# Patient Record
Sex: Male | Born: 1995 | Race: White | Hispanic: No | Marital: Single | Smoking: Former smoker
Health system: Southern US, Community
[De-identification: ages and names within clinical notes are randomized; demographics above are authoritative.]

## PROBLEM LIST (undated history)

## (undated) DIAGNOSIS — J45909 Unspecified asthma, uncomplicated: Secondary | ICD-10-CM

## (undated) DIAGNOSIS — R569 Unspecified convulsions: Secondary | ICD-10-CM

## (undated) DIAGNOSIS — F329 Major depressive disorder, single episode, unspecified: Secondary | ICD-10-CM

## (undated) DIAGNOSIS — F319 Bipolar disorder, unspecified: Secondary | ICD-10-CM

## (undated) DIAGNOSIS — F4310 Post-traumatic stress disorder, unspecified: Secondary | ICD-10-CM

## (undated) DIAGNOSIS — F419 Anxiety disorder, unspecified: Secondary | ICD-10-CM

## (undated) DIAGNOSIS — F32A Depression, unspecified: Secondary | ICD-10-CM

## (undated) HISTORY — DX: Major depressive disorder, single episode, unspecified: F32.9

## (undated) HISTORY — DX: Depression, unspecified: F32.A

## (undated) HISTORY — PX: HERNIA REPAIR: SHX51

## (undated) HISTORY — DX: Anxiety disorder, unspecified: F41.9

---
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
## 2016-09-05 ENCOUNTER — Emergency Department: Payer: Medicaid Other

## 2016-09-05 ENCOUNTER — Encounter: Payer: Self-pay | Admitting: *Deleted

## 2016-09-05 ENCOUNTER — Inpatient Hospital Stay
Admission: EM | Admit: 2016-09-05 | Discharge: 2016-09-08 | DRG: 897 | Disposition: A | Discharge status: Home / Self Care | Payer: Medicaid Other | Attending: Internal Medicine | Admitting: Internal Medicine

## 2016-09-05 DIAGNOSIS — W2201XA Walked into wall, initial encounter: Secondary | ICD-10-CM | POA: Diagnosis present

## 2016-09-05 DIAGNOSIS — F909 Attention-deficit hyperactivity disorder, unspecified type: Secondary | ICD-10-CM | POA: Diagnosis present

## 2016-09-05 DIAGNOSIS — F17200 Nicotine dependence, unspecified, uncomplicated: Secondary | ICD-10-CM | POA: Diagnosis present

## 2016-09-05 DIAGNOSIS — F19959 Other psychoactive substance use, unspecified with psychoactive substance-induced psychotic disorder, unspecified: Principal | ICD-10-CM | POA: Diagnosis present

## 2016-09-05 DIAGNOSIS — F3181 Bipolar II disorder: Secondary | ICD-10-CM | POA: Diagnosis present

## 2016-09-05 DIAGNOSIS — Z810 Family history of intellectual disabilities: Secondary | ICD-10-CM

## 2016-09-05 DIAGNOSIS — Z79899 Other long term (current) drug therapy: Secondary | ICD-10-CM

## 2016-09-05 DIAGNOSIS — R410 Disorientation, unspecified: Secondary | ICD-10-CM | POA: Diagnosis not present

## 2016-09-05 DIAGNOSIS — F29 Unspecified psychosis not due to a substance or known physiological condition: Secondary | ICD-10-CM

## 2016-09-05 DIAGNOSIS — R569 Unspecified convulsions: Secondary | ICD-10-CM | POA: Diagnosis present

## 2016-09-05 DIAGNOSIS — R32 Unspecified urinary incontinence: Secondary | ICD-10-CM | POA: Diagnosis not present

## 2016-09-05 LAB — URINE DRUG SCREEN, QUALITATIVE (ARMC ONLY)
AMPHETAMINES, UR SCREEN: POSITIVE — AB
Barbiturates, Ur Screen: NOT DETECTED
Benzodiazepine, Ur Scrn: NOT DETECTED
CANNABINOID 50 NG, UR ~~LOC~~: NOT DETECTED
COCAINE METABOLITE, UR ~~LOC~~: NOT DETECTED
MDMA (ECSTASY) UR SCREEN: NOT DETECTED
Methadone Scn, Ur: NOT DETECTED
OPIATE, UR SCREEN: NOT DETECTED
PHENCYCLIDINE (PCP) UR S: NOT DETECTED
Tricyclic, Ur Screen: NOT DETECTED

## 2016-09-05 LAB — BASIC METABOLIC PANEL
Anion gap: 8 (ref 5–15)
BUN: 20 mg/dL (ref 6–20)
CHLORIDE: 106 mmol/L (ref 101–111)
CO2: 26 mmol/L (ref 22–32)
CREATININE: 1.2 mg/dL (ref 0.61–1.24)
Calcium: 9.6 mg/dL (ref 8.9–10.3)
GFR calc non Af Amer: >60 mL/min (ref >60)
Glucose, Bld: 98 mg/dL (ref 65–99)
POTASSIUM: 3.9 mmol/L (ref 3.5–5.1)
SODIUM: 140 mmol/L (ref 135–145)

## 2016-09-05 LAB — CBC
HEMATOCRIT: 45 % (ref 40.0–52.0)
Hemoglobin: 15.4 g/dL (ref 13.0–18.0)
MCH: 31.8 pg (ref 26.0–34.0)
MCHC: 34.2 g/dL (ref 32.0–36.0)
MCV: 93 fL (ref 80.0–100.0)
Platelets: 281 10*3/uL (ref 150–440)
RBC: 4.84 MIL/uL (ref 4.40–5.90)
RDW: 13.2 % (ref 11.5–14.5)
WBC: 8 10*3/uL (ref 3.8–10.6)

## 2016-09-05 NOTE — ED Notes (Signed)
MD at bedside to update pt and pts family members.

## 2016-09-05 NOTE — Discharge Instructions (Signed)
As we discussed it is very important that you do not drive, go up on roofs, swim, or put yourself in any situation that might be dangerous for you or others if you were to have another seizure until you are cleared by a neurologist. Please seek medical attention for any high fevers, chest pain, shortness of breath, change in behavior, persistent vomiting, bloody stool or any other new or concerning symptoms. ° °

## 2016-09-05 NOTE — ED Notes (Signed)
RN asked pt to provide urine sample. Pt reports he is not able to do so at this time. Urinal placed at bedside and call bell in reach. PT verbalized understanding of needed urine sample.
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
## 2016-09-05 NOTE — ED Provider Notes (Signed)
[facility] Emergency Department Provider Note   ____________________________________________   I have reviewed the triage vital signs and the nursing notes.   HISTORY  Chief Complaint Seizures   History limited by: Not Limited   HPI Joel Arroyo is a 21 y.o. male who presents to the emergency department today via EMS because of concerns for seizure. The patient is coming from a group home or the witnesses seizure. The patient states that he had had 2 or 3 previous seizures today at the park. The patient states that he had previously hit his head against a wall playing volleyball department. He denies any loss of consciousness with this. He denies any history of seizures. Denies any new medication. Denies any recent illness. Denies any alcohol or drug use.    Past Medical History:  Diagnosis Date  . Bipolar 1 disorder [facility](HCC)     Patient Active Problem List   Diagnosis Date Noted  . Bipolar 2 disorder, major depressive episode (HCC) 09/15/2015  . Suicidal ideation 09/15/2015  . Homicidal ideation 09/15/2015  . ADHD (attention deficit hyperactivity disorder) 09/14/2015  . Involuntary commitment 09/14/2015    Past Surgical History:  Procedure Laterality Date  . HERNIA REPAIR      Prior to Admission medications   Medication Sig Start Date End Date Taking? Authorizing Provider  fluvoxaMINE (LUVOX) 100 MG tablet Take 1 tablet (100 mg total) by mouth at bedtime. 09/18/15   Pucilowska, Braulio ConteJolanta B, MD  lisdexamfetamine (VYVANSE) 50 MG capsule Take 1 capsule (50 mg total) by mouth every morning. 09/18/15   Pucilowska, Jolanta B, MD  Oxcarbazepine (TRILEPTAL) 300 MG tablet Take 1 tablet (300 mg total) by mouth 2 (two) times daily. 09/18/15   Pucilowska, Jolanta B, MD  temazepam (RESTORIL) 15 MG capsule Take 1 capsule (15 mg total) by mouth at bedtime. 09/18/15   Pucilowska, Ellin GoodieJolanta B, MD    Allergies Patient has no known allergies.  History reviewed. No  pertinent family history.  Social History Social History  Substance Use Topics  . Smoking status: Current Every Day Smoker  . Smokeless tobacco: Never Used  . Alcohol use No    Review of Systems Constitutional: No fever/chills Eyes: No visual changes. ENT: No sore throat. Cardiovascular: Denies chest pain. Respiratory: Denies shortness of breath. Gastrointestinal: No abdominal pain.  No nausea, no vomiting.  No diarrhea.   Genitourinary: Negative for dysuria. Musculoskeletal: Positive for diffuse pain. Skin: Negative for rash. Neurological: Positive for headache. ____________________________________________   PHYSICAL EXAM:  VITAL SIGNS: ED Triage Vitals  Enc Vitals Group     BP 09/05/16 2115 (!) 124/91     Pulse Rate 09/05/16 2115 68     Resp 09/05/16 2115 16     Temp 09/05/16 2115 98.7 F (37.1 C)     Temp Source 09/05/16 2115 Oral     SpO2 09/05/16 2115 100 %     Weight 09/05/16 2116 163 lb (73.9 kg)     Height 09/05/16 2116 6\' 2"  (1.88 m)     Head Circumference --      Peak Flow --      Pain Score 09/05/16 2115 9   Constitutional: Alert and oriented. Well appearing and in no distress. Eyes: Conjunctivae are normal.  ENT   Head: Normocephalic and atraumatic.   Nose: No congestion/rhinnorhea.   Mouth/Throat: Mucous membranes are moist.   Neck: No stridor. Hematological/Lymphatic/Immunilogical: No cervical lymphadenopathy. Cardiovascular: Normal rate, regular rhythm.  No murmurs, rubs, or gallops. Respiratory:  Normal respiratory effort without tachypnea nor retractions. Breath sounds are clear and equal bilaterally. No wheezes/rales/rhonchi. Gastrointestinal: Soft and non tender. No rebound. No guarding.  Genitourinary: Deferred Musculoskeletal: Normal range of motion in all extremities. No lower extremity edema. Neurologic:  Normal speech and language. PERRL. EOMI. Strength 5/5 in upper and lower extremities. Sensation intact. No gross focal  neurologic deficits are appreciated.  Skin:  Skin is warm, dry and intact. No rash noted. Psychiatric: Mood and affect are normal. Speech and behavior are normal. Patient exhibits appropriate insight and judgment.  ____________________________________________    LABS (pertinent positives/negatives)  Labs Reviewed  URINE DRUG SCREEN, QUALITATIVE ([facility] ONLY) - Abnormal; Notable for the following:       Result Value   Amphetamines, Ur Screen POSITIVE (*)    All other components within normal limits  BASIC METABOLIC PANEL  CBC     ____________________________________________   EKG  I, Phineas Semen, attending physician, personally viewed and interpreted this EKG  EKG Time: 2114 Rate: 71 Rhythm: normal sinus rhythm Axis: right axis deviation Intervals: qtc 420 QRS: narrow ST changes: no st elevation Impression: abnormal ekg   ____________________________________________    RADIOLOGY  CT head IMPRESSION:  Unremarkable noncontrast CT of the head.   ____________________________________________   PROCEDURES  Procedures  ____________________________________________   INITIAL IMPRESSION / ASSESSMENT AND PLAN / ED COURSE  Pertinent labs & imaging results that were available during my care of the patient were reviewed by me and considered in my medical decision making (see chart for details).  Patient presented to the emergency department today because of concerns for seizure-like activity. However EMS reports that he had multiple episodes he however apparently did not have a post ictal period. No concerning exam findings here in the emergency department. CT head negative. Blood work negative. At this point I did not think likely the patient suffered multiple true seizures. Will plan on discharging and having patient follow up with neurology.  ____________________________________________   FINAL CLINICAL IMPRESSION(S) / ED DIAGNOSES  Final diagnoses:   Seizure-like activity (HCC)     Note: This dictation was prepared with Dragon dictation. Any transcriptional errors that result from this process are unintentional     Phineas Semen, MD 09/06/16 0028

## 2016-09-06 ENCOUNTER — Observation Stay: Payer: Medicaid Other

## 2016-09-06 DIAGNOSIS — R569 Unspecified convulsions: Secondary | ICD-10-CM | POA: Diagnosis not present

## 2016-09-06 LAB — GLUCOSE, CAPILLARY
GLUCOSE-CAPILLARY: 107 mg/dL — AB (ref 65–99)
Glucose-Capillary: 91 mg/dL (ref 65–99)

## 2016-09-06 LAB — BASIC METABOLIC PANEL
Anion gap: 7 (ref 5–15)
BUN: 15 mg/dL (ref 6–20)
CO2: 24 mmol/L (ref 22–32)
Calcium: 9 mg/dL (ref 8.9–10.3)
Chloride: 104 mmol/L (ref 101–111)
Creatinine, Ser: 0.91 mg/dL (ref 0.61–1.24)
GFR calc Af Amer: >60 mL/min (ref >60)
GFR calc non Af Amer: >60 mL/min (ref >60)
GLUCOSE: 99 mg/dL (ref 65–99)
Potassium: 3.7 mmol/L (ref 3.5–5.1)
SODIUM: 135 mmol/L (ref 135–145)

## 2016-09-06 LAB — PHOSPHORUS: Phosphorus: 3.5 mg/dL (ref 2.5–4.6)

## 2016-09-06 LAB — TSH: TSH: 2.105 u[IU]/mL (ref 0.350–4.500)

## 2016-09-06 LAB — MRSA PCR SCREENING: MRSA by PCR: NEGATIVE

## 2016-09-06 LAB — MAGNESIUM: Magnesium: 2 mg/dL (ref 1.7–2.4)

## 2016-09-06 LAB — VALPROIC ACID LEVEL: Valproic Acid Lvl: 63 ug/mL (ref 50.0–100.0)

## 2016-09-06 MED ORDER — ACETAMINOPHEN 325 MG PO TABS
650.0000 mg | ORAL_TABLET | Freq: Four times a day (QID) | ORAL | Status: DC | PRN
  Administered 2016-09-06 – 2016-09-07 (×2): 650 mg via ORAL
  Filled 2016-09-06 (×2): qty 2

## 2016-09-06 MED ORDER — LISDEXAMFETAMINE DIMESYLATE 30 MG PO CAPS
60.0000 mg | ORAL_CAPSULE | ORAL | Status: DC
  Administered 2016-09-06: 60 mg via ORAL
  Filled 2016-09-06: qty 2

## 2016-09-06 MED ORDER — LORAZEPAM 2 MG/ML IJ SOLN
2.0000 mg | INTRAMUSCULAR | Status: DC | PRN
Start: 2016-09-06 — End: 2016-09-09
  Administered 2016-09-06 (×2): 2 mg via INTRAVENOUS
  Filled 2016-09-06 (×2): qty 1

## 2016-09-06 MED ORDER — HEPARIN SODIUM (PORCINE) 5000 UNIT/ML IJ SOLN: 5000.0000 [IU] | Freq: Two times a day (BID) | INTRAMUSCULAR | Status: DC

## 2016-09-06 MED ORDER — LORAZEPAM 2 MG/ML IJ SOLN
INTRAMUSCULAR | Status: AC
  Administered 2016-09-06: 12:00:00 2 mg via INTRAVENOUS
  Filled 2016-09-06: qty 1

## 2016-09-06 MED ORDER — LORAZEPAM 2 MG/ML IJ SOLN
2.0000 mg | INTRAMUSCULAR | Status: AC | PRN
  Administered 2016-09-06 (×2): 2 mg via INTRAVENOUS
  Filled 2016-09-06 (×2): qty 1

## 2016-09-06 MED ORDER — ONDANSETRON HCL 4 MG PO TABS
4.0000 mg | ORAL_TABLET | Freq: Four times a day (QID) | ORAL | Status: DC | PRN
Start: 2016-09-06 — End: 2016-09-09

## 2016-09-06 MED ORDER — ACETAMINOPHEN 650 MG RE SUPP: 650.0000 mg | Freq: Four times a day (QID) | RECTAL | Status: DC | PRN

## 2016-09-06 MED ORDER — DOCUSATE SODIUM 100 MG PO CAPS
100.0000 mg | ORAL_CAPSULE | Freq: Two times a day (BID) | ORAL | Status: DC
  Administered 2016-09-06 – 2016-09-08 (×4): 100 mg via ORAL
  Filled 2016-09-06 (×4): qty 1

## 2016-09-06 MED ORDER — SODIUM CHLORIDE 0.9% FLUSH
3.0000 mL | Freq: Two times a day (BID) | INTRAVENOUS | Status: DC
  Administered 2016-09-06 (×3): 3 mL via INTRAVENOUS

## 2016-09-06 MED ORDER — OXCARBAZEPINE 300 MG PO TABS
300.0000 mg | ORAL_TABLET | Freq: Two times a day (BID) | ORAL | Status: DC
  Administered 2016-09-06 – 2016-09-08 (×4): 300 mg via ORAL
  Filled 2016-09-06 (×5): qty 1

## 2016-09-06 MED ORDER — VALPROATE SODIUM 500 MG/5ML IV SOLN
1000.0000 mg | Freq: Once | INTRAVENOUS | Status: AC
  Administered 2016-09-06: 1000 mg via INTRAVENOUS
  Filled 2016-09-06: qty 10

## 2016-09-06 MED ORDER — DEXTROSE-NACL 5-0.45 % IV SOLN
INTRAVENOUS | Status: DC
  Administered 2016-09-06 – 2016-09-07 (×3): via INTRAVENOUS

## 2016-09-06 MED ORDER — SODIUM CHLORIDE 0.9 % IV SOLN
500.0000 mg | Freq: Two times a day (BID) | INTRAVENOUS | Status: DC
  Administered 2016-09-06 – 2016-09-07 (×3): 500 mg via INTRAVENOUS
  Filled 2016-09-06 (×4): qty 5

## 2016-09-06 MED ORDER — VALPROATE SODIUM 500 MG/5ML IV SOLN
500.0000 mg | Freq: Three times a day (TID) | INTRAVENOUS | Status: DC
  Administered 2016-09-06 – 2016-09-08 (×6): 500 mg via INTRAVENOUS
  Filled 2016-09-06 (×9): qty 5

## 2016-09-06 MED ORDER — LORAZEPAM 2 MG/ML IJ SOLN
1.0000 mg | Freq: Once | INTRAMUSCULAR | Status: AC
  Administered 2016-09-06: 1 mg via INTRAVENOUS

## 2016-09-06 MED ORDER — FLUVOXAMINE MALEATE 50 MG PO TABS
100.0000 mg | ORAL_TABLET | Freq: Every day | ORAL | Status: DC
  Filled 2016-09-06: qty 2

## 2016-09-06 MED ORDER — SODIUM CHLORIDE 0.9% FLUSH
3.0000 mL | INTRAVENOUS | Status: DC | PRN
  Administered 2016-09-06: 11:00:00 3 mL via INTRAVENOUS
  Filled 2016-09-06: qty 3

## 2016-09-06 MED ORDER — SODIUM CHLORIDE 0.9 % IV SOLN
1000.0000 mg | Freq: Once | INTRAVENOUS | Status: AC
  Administered 2016-09-06: 1000 mg via INTRAVENOUS
  Filled 2016-09-06: qty 10

## 2016-09-06 MED ORDER — FAMOTIDINE IN NACL 20-0.9 MG/50ML-% IV SOLN
20.0000 mg | Freq: Two times a day (BID) | INTRAVENOUS | Status: DC
  Administered 2016-09-06 – 2016-09-08 (×4): 20 mg via INTRAVENOUS
  Filled 2016-09-06 (×5): qty 50

## 2016-09-06 MED ORDER — ONDANSETRON HCL 4 MG/2ML IJ SOLN
4.0000 mg | Freq: Four times a day (QID) | INTRAMUSCULAR | Status: DC | PRN
  Administered 2016-09-06 – 2016-09-07 (×3): 4 mg via INTRAVENOUS
  Filled 2016-09-06 (×3): qty 2

## 2016-09-06 MED ORDER — SODIUM CHLORIDE 0.9 % IV SOLN
INTRAVENOUS | Status: DC
  Administered 2016-09-06: 12:00:00 via INTRAVENOUS

## 2016-09-06 MED ORDER — HEPARIN SODIUM (PORCINE) 5000 UNIT/ML IJ SOLN
5000.0000 [IU] | Freq: Three times a day (TID) | INTRAMUSCULAR | Status: DC
  Administered 2016-09-06 – 2016-09-08 (×6): 5000 [IU] via SUBCUTANEOUS
  Filled 2016-09-06 (×6): qty 1

## 2016-09-06 MED ORDER — LORAZEPAM 2 MG/ML IJ SOLN
2.0000 mg | INTRAMUSCULAR | Status: AC
  Administered 2016-09-06: 2 mg via INTRAVENOUS

## 2016-09-06 MED ORDER — LURASIDONE HCL 20 MG PO TABS
20.0000 mg | ORAL_TABLET | Freq: Every day | ORAL | Status: DC
  Administered 2016-09-06 – 2016-09-07 (×2): 20 mg via ORAL
  Filled 2016-09-06 (×2): qty 1

## 2016-09-06 NOTE — ED Notes (Signed)
Pt continues to sleep. resps remain unlabored.

## 2016-09-06 NOTE — H&P (Signed)
Joel Arroyo is an 21 y.o. male.   Chief Complaint: Seizure HPI: The patient with past medical history of mood disorder and ADHD presents to the emergency department after seizures. The patient reports that he has not had seizures before. This is questionable as he had been on antiepileptic medication. He reported to nursing staff that he had been hanging out with friends and park when he had 2 seizures. The patient recovered well in the emergency department and was about to be discharged when he had another seizure lasting less than 2 minutes. He was given Ativan and seizure activity resolved. He was loaded with Keppra. CT of his head was normal yet the patient remained postictal which prompted the emergency department staff to call the hospitalist service for admission.  Past Medical History:  Diagnosis Date  . Bipolar 1 disorder [facility])     Past Surgical History:  Procedure Laterality Date  . HERNIA REPAIR      History reviewed. No pertinent family history. None Social History:  reports that he has been smoking.  He has never used smokeless tobacco. He reports that he does not drink alcohol. His drug history is not on file.  Allergies: No Known Allergies  Medications Prior to Admission  Medication Sig Dispense Refill  . fluvoxaMINE (LUVOX) 100 MG tablet Take 1 tablet (100 mg total) by mouth at bedtime. 30 tablet 0  . lisdexamfetamine (VYVANSE) 60 MG capsule Take 60 mg by mouth every morning.    . lurasidone (LATUDA) 20 MG TABS tablet Take 20 mg by mouth daily.    . Oxcarbazepine (TRILEPTAL) 300 MG tablet Take 1 tablet (300 mg total) by mouth 2 (two) times daily. 60 tablet 0  . TRAZODONE HCL PO Take 1 tablet by mouth at bedtime.      Results for orders placed or performed during the hospital encounter of 09/05/16 (from the past 48 hour(s))  Basic metabolic panel - if new onset seizures     Status: None   Collection Time: 09/05/16  9:14 PM  Result Value Ref Range   Sodium 140 135  - 145 mmol/L   Potassium 3.9 3.5 - 5.1 mmol/L   Chloride 106 101 - 111 mmol/L   CO2 26 22 - 32 mmol/L   Glucose, Bld 98 65 - 99 mg/dL   BUN 20 6 - 20 mg/dL   Creatinine, Ser 1.20 0.61 - 1.24 mg/dL   Calcium 9.6 8.9 - 10.3 mg/dL   GFR calc non Af Amer >60 >60 mL/min   GFR calc Af Amer >60 >60 mL/min    Comment: (NOTE) The eGFR has been calculated using the CKD EPI equation. This calculation has not been validated in all clinical situations. eGFR's persistently <60 mL/min signify possible Chronic Kidney Disease.    Anion gap 8 5 - 15  CBC - if new onset seizures     Status: None   Collection Time: 09/05/16  9:14 PM  Result Value Ref Range   WBC 8.0 3.8 - 10.6 K/uL   RBC 4.84 4.40 - 5.90 MIL/uL   Hemoglobin 15.4 13.0 - 18.0 g/dL   HCT 45.0 40.0 - 52.0 %   MCV 93.0 80.0 - 100.0 fL   MCH 31.8 26.0 - 34.0 pg   MCHC 34.2 32.0 - 36.0 g/dL   RDW 13.2 11.5 - 14.5 %   Platelets 281 150 - 440 K/uL  Urine Drug Screen, Qualitative ([facility] only)     Status: Abnormal   Collection Time: 09/05/16  9:24 PM  Result Value Ref Range   Tricyclic, Ur Screen NONE DETECTED NONE DETECTED   Amphetamines, Ur Screen POSITIVE (A) NONE DETECTED   MDMA (Ecstasy)Ur Screen NONE DETECTED NONE DETECTED   Cocaine Metabolite,Ur [place] NONE DETECTED NONE DETECTED   Opiate, Ur Screen NONE DETECTED NONE DETECTED   Phencyclidine (PCP) Ur S NONE DETECTED NONE DETECTED   Cannabinoid 50 Ng, Ur [place] NONE DETECTED NONE DETECTED   Barbiturates, Ur Screen NONE DETECTED NONE DETECTED   Benzodiazepine, Ur Scrn NONE DETECTED NONE DETECTED   Methadone Scn, Ur NONE DETECTED NONE DETECTED    Comment: (NOTE) 831  Tricyclics, urine               Cutoff 1000 ng/mL 200  Amphetamines, urine             Cutoff 1000 ng/mL 300  MDMA (Ecstasy), urine           Cutoff 500 ng/mL 400  Cocaine Metabolite, urine       Cutoff 300 ng/mL 500  Opiate, urine                   Cutoff 300 ng/mL 600  Phencyclidine (PCP), urine      Cutoff 25 ng/mL 700   Cannabinoid, urine              Cutoff 50 ng/mL 800  Barbiturates, urine             Cutoff 200 ng/mL 900  Benzodiazepine, urine           Cutoff 200 ng/mL 1000 Methadone, urine                Cutoff 300 ng/mL 1100 1200 The urine drug screen provides only a preliminary, unconfirmed 1300 analytical test result and should not be used for non-medical 1400 purposes. Clinical consideration and professional judgment should 1500 be applied to any positive drug screen result due to possible 1600 interfering substances. A more specific alternate chemical method 1700 must be used in order to obtain a confirmed analytical result.  1800 Gas chromato graphy / mass spectrometry (GC/MS) is the preferred 1900 confirmatory method.   Glucose, capillary     Status: Abnormal   Collection Time: 09/06/16  1:28 AM  Result Value Ref Range   Glucose-Capillary 107 (H) 65 - 99 mg/dL   Ct Head Wo Contrast  Result Date: 09/05/2016 CLINICAL DATA:  Acute onset of seizure-like activity. Foaming at the mouth. Generalized soreness. Initial encounter. EXAM: CT HEAD WITHOUT CONTRAST TECHNIQUE: Contiguous axial images were obtained from the base of the skull through the vertex without intravenous contrast. COMPARISON:  None. FINDINGS: Brain: No evidence of acute infarction, hemorrhage, hydrocephalus, extra-axial collection or mass lesion/mass effect. The posterior fossa, including the cerebellum, brainstem and fourth ventricle, is within normal limits. The third and lateral ventricles, and basal ganglia are unremarkable in appearance. The cerebral hemispheres are symmetric in appearance, with normal gray-white differentiation. No mass effect or midline shift is seen. Vascular: No hyperdense vessel or unexpected calcification. Skull: There is no evidence of fracture; visualized osseous structures are unremarkable in appearance. Sinuses/Orbits: The visualized portions of the orbits are within normal limits. The paranasal sinuses and  mastoid air cells are well-aerated. Other: No significant soft tissue abnormalities are seen. IMPRESSION: Unremarkable noncontrast CT of the head. Electronically Signed   By: Garald Balding M.D.   On: 09/05/2016 21:38    Review of Systems  Constitutional: Negative for chills and fever.  HENT: Negative for sore throat and tinnitus.   Eyes: Negative for blurred vision and redness.  Respiratory: Negative for cough and shortness of breath.   Cardiovascular: Negative for chest pain, palpitations, orthopnea and PND.  Gastrointestinal: Negative for abdominal pain, diarrhea, nausea and vomiting.  Genitourinary: Negative for dysuria, frequency and urgency.  Musculoskeletal: Negative for joint pain and myalgias.  Skin: Negative for rash.       No lesions  Neurological: Positive for seizures. Negative for speech change, focal weakness and weakness.  Endo/Heme/Allergies: Does not bruise/bleed easily.       No temperature intolerance  Psychiatric/Behavioral: Negative for depression and suicidal ideas.    Blood pressure 120/72, pulse 61, temperature 98.3 F (36.8 C), temperature source Oral, resp. rate 18, height 5' 9"  (1.753 m), weight 72.9 kg (160 lb 12.8 oz), SpO2 99 %. Physical Exam  Nursing note and vitals reviewed. Constitutional: He appears well-developed and well-nourished. No distress.  HENT:  Head: Normocephalic and atraumatic.  Mouth/Throat: Oropharynx is clear and moist.  Eyes: Conjunctivae and EOM are normal. Pupils are equal, round, and reactive to light. No scleral icterus.  Neck: Normal range of motion. Neck supple. No JVD present. No tracheal deviation present. No thyromegaly present.  Cardiovascular: Normal rate, regular rhythm and normal heart sounds.  Exam reveals no gallop and no friction rub.   No murmur heard. Respiratory: Effort normal and breath sounds normal. No respiratory distress.  GI: Soft. Bowel sounds are normal. He exhibits no distension. There is no tenderness.   Genitourinary:  Genitourinary Comments: Deferred  Musculoskeletal: Normal range of motion. He exhibits no edema.  Lymphadenopathy:    He has no cervical adenopathy.  Neurological: No cranial nerve deficit.  Somnolent  Skin: Skin is warm and dry. No rash noted. No erythema.  Psychiatric:  Difficult to assess mental status as the patient has trouble staying awake     Assessment/Plan This is a 21 year old male admitted for seizures. 1. Seizures: Based on home medication list the patient likely has seizure disorder although he denied taking seizure medicine. During her brief interview the patient also was not forthcoming about what he was doing in the park and laughed when I asked if he had used any illicit substances. A toxicology screen shows amphetamines but the type is inconclusive as the patient takes Vyvanse for ADHD. I have consulted neurology and we will have Ativan at the bedside in case of repeat seizure activity. Resume Trileptal. 2. Bipolar disorder: Continue fluoxetine as well as Latuda. 3. DVT prophylaxis: Early ambulation 4. GI prophylaxis: None The patient is a full code. Time spent on admission orders and patient care approximate 45 minutes  Harrie Foreman, MD 09/06/2016, 7:33 AM

## 2016-09-06 NOTE — ED Notes (Signed)
RN entered room to round on pt and found pt having a seizure. Pts whole body was shaking with foaming at the mouth and eyes rolled to the back of head. Pt was not responding to nurse. Suction and oxygen where set up and pt was suctioned and placed on 15L NRB. MD to bedside.

## 2016-09-06 NOTE — Progress Notes (Signed)
Bed alarm activated with pt having seizure. Seizure precautions observed. Report called to stepdown nurse. Dr.Kalisettie notified. Ready to transport pt in bed to bed 4.

## 2016-09-06 NOTE — Clinical Social Work Note (Signed)
CSW received consult that the patient is a resident at [facility]. CSW will assess when able.  Argentina PonderKaren Martha Teanna Elem, MSW, Theresia MajorsLCSWA [phone]

## 2016-09-06 NOTE — Progress Notes (Signed)
Patient had two jerking episodes. Vitals remain the same and stable during these episodes. Patient has only had these episodes when someone is at bedside.

## 2016-09-06 NOTE — ED Notes (Signed)
Pts [facility] and [facility].

## 2016-09-06 NOTE — ED Notes (Addendum)
RN able to get through to group home and update that pt would be admitted to hospital.   RN also attempted to call pts legal guardian to update on admission, Unable to get guardian at this time.

## 2016-09-06 NOTE — Progress Notes (Signed)
[employer] [facility] [place] [facility]   PATIENT NAME: Joel Arroyo    MR#:  [id number]  DATE OF BIRTH:  January 17, 1996  SUBJECTIVE:  CHIEF COMPLAINT:   Chief Complaint  Patient presents with  . Seizures   - Patient from group home admitted after new onset seizures. Patient already had 4 seizures since morning, generalized tonic-clonic with foaming and incontinence.  REVIEW OF SYSTEMS:  Review of Systems  Unable to perform ROS: Mental acuity    DRUG ALLERGIES:  No Known Allergies  VITALS:  Blood pressure 125/83, pulse 89, temperature 98 F (36.7 C), temperature source Axillary, resp. rate 16, height 5\' 10"  (1.778 m), weight 72.2 kg (159 lb 2.8 oz), SpO2 96 %.  PHYSICAL EXAMINATION:  Physical Exam  GENERAL:  21 y.o.-year-old patient lying in the bed with no acute distress. Sedated from Ativan EYES: Pupils equal, round, reactive to light and accommodation. No scleral icterus. Extraocular muscles intact.  HEENT: Head atraumatic, normocephalic. Oropharynx and nasopharynx clear.  NECK:  Supple, no jugular venous distention. No thyroid enlargement, no tenderness.  LUNGS: Normal breath sounds bilaterally, no wheezing, rales,rhonchi or crepitation. No use of accessory muscles of respiration.  CARDIOVASCULAR: S1, S2 normal. No murmurs, rubs, or gallops.  ABDOMEN: Soft, nontender, nondistended. Bowel sounds present. No organomegaly or mass.  EXTREMITIES: No pedal edema, cyanosis, or clubbing.  NEUROLOGIC: Cranial nerves II through XII are intact. Able to move all 4 extremities in bed, has had tonic-clonic seizures. PSYCHIATRIC: The patient is sedated during my exam, easily arousable, oriented to self. Following simple commands and able to move all extremities. SKIN: No obvious rash, lesion, or ulcer.    LABORATORY PANEL:   CBC  Recent Labs Lab 09/05/16 2114  WBC 8.0  HGB 15.4  HCT 45.0  PLT 281    ------------------------------------------------------------------------------------------------------------------  Chemistries   Recent Labs Lab 09/05/16 2114  NA 140  K 3.9  CL 106  CO2 26  GLUCOSE 98  BUN 20  CREATININE 1.20  CALCIUM 9.6   ------------------------------------------------------------------------------------------------------------------  Cardiac Enzymes No results for input(s): TROPONINI in the last 168 hours. ------------------------------------------------------------------------------------------------------------------  RADIOLOGY:  Ct Head Wo Contrast  Result Date: 09/05/2016 CLINICAL DATA:  Acute onset of seizure-like activity. Foaming at the mouth. Generalized soreness. Initial encounter. EXAM: CT HEAD WITHOUT CONTRAST TECHNIQUE: Contiguous axial images were obtained from the base of the skull through the vertex without intravenous contrast. COMPARISON:  None. FINDINGS: Brain: No evidence of acute infarction, hemorrhage, hydrocephalus, extra-axial collection or mass lesion/mass effect. The posterior fossa, including the cerebellum, brainstem and fourth ventricle, is within normal limits. The third and lateral ventricles, and basal ganglia are unremarkable in appearance. The cerebral hemispheres are symmetric in appearance, with normal gray-white differentiation. No mass effect or midline shift is seen. Vascular: No hyperdense vessel or unexpected calcification. Skull: There is no evidence of fracture; visualized osseous structures are unremarkable in appearance. Sinuses/Orbits: The visualized portions of the orbits are within normal limits. The paranasal sinuses and mastoid air cells are well-aerated. Other: No significant soft tissue abnormalities are seen. IMPRESSION: Unremarkable noncontrast CT of the head. Electronically Signed   By: Roanna Raider M.D.   On: 09/05/2016 21:38    EKG:   Orders placed or performed during the hospital encounter of 09/05/16   . EKG 12-Lead  . EKG 12-Lead    ASSESSMENT AND PLAN:   21 year old male with past medical history significant for bipolar disorder and ADHD from a group home comes to hospital secondary  to new onset seizures.  #1 new onset seizures-status epilepticus, back-to-back seizures since morning. -On IV Ativan, urine tox screen is negative other than amphetamines because patient is on Vyvanse. -Patient has denied taking any recreational drugs yesterday. -Loaded with Keppra and on IV Keppra since morning. -CT of the head without any acute findings. Neurology consulted. -Loading with Depakote and follow up Depakote level in a.m. -Moved to ICU at this time. Seizure precautions. -MRI of the brain ordered. -Discussed with neurology, since no elevated white count and no fevers, patient will not need a lumbar puncture at this time. -If continues to have more seizures-might need continuous EEG monitoring and IV propofol drip. -At this time he is still protecting his airway.  #2 ADHD-hold Vyvanse due to his seizures at this time.   #3 bipolar disorder-patient on Latuda, luvox and Trileptal  #4 DVT prophylaxis-on teds and SCDs   Discussed with neurology and also intensivist. Patient will be transferred to ICU.     All the records are reviewed and case discussed with Care Management/Social Workerr. Management plans discussed with the patient, family and they are in agreement.  CODE STATUS: Full code  TOTAL CRITICAL CARE TIME SPENT IN TAKING CARE OF THIS PATIENT: 41 minutes.   POSSIBLE D/C IN 2 DAYS, DEPENDING ON CLINICAL CONDITION.   Enid BaasKALISETTI,Melinda Pottinger M.D on 09/06/2016 at 1:30 PM  Between 7am to 6pm - Pager - [phone]  After 6pm go to [email or web address] - password [email or web address]  [facility]  Office  [phone]  CC: Primary care physician; Patient, No Pcp Per

## 2016-09-06 NOTE — ED Notes (Signed)
RN provided pt with meal tray and water. Pt eating at this time.   RN called group home and staff reported they will attempt to find transportation for pt home. RN will follow up with care home shortly.

## 2016-09-06 NOTE — ED Notes (Signed)
RN attempted to call group home to update. No answer.

## 2016-09-06 NOTE — ED Provider Notes (Signed)
-----------------------------------------   1:32 AM on 09/06/2016 -----------------------------------------  Called urgently to bedside for patient having seizure. Arrived to find patient with eyes rolled to the back of his head with tonic-clonic activity. Seizure lasted approximately 2 minutes; patient postictal afterwards. IV Ativan administered. Keppra 1000 mg ordered; will discuss with hospitalist evaluate patient in the emergency department for new onset seizures. Reportedly this would make the patient's fourth seizure within the past 24 hours.   Irean HongSung, Jade J, MD 09/06/16 [id number]

## 2016-09-06 NOTE — Progress Notes (Signed)
Pt had observed grand mal seizure with duration x 1 minute with ativan 2 mg IV given and tolerated well. Dr. Nemiah CommanderKalisetti notifed with Keppra IV ordered and given. Pt able to take oral meds, VSS. Cooperative with requests, somulent/dull affect. Telemetry applied with telesitter in place. No further seizures observed at this time.

## 2016-09-06 NOTE — Progress Notes (Signed)
Bed alarm activated with pt having generalized seizure again. Dr. Nemiah CommanderKalisetti on floor with stat dose of ativan given. Provided safety.Will prepare to transfer to step down unit. VSS.

## 2016-09-06 NOTE — Progress Notes (Signed)
Pt constantly moving/crossing legs/ sitting up during MRI. Notified Dr Thad Rangereynolds.

## 2016-09-06 NOTE — ED Notes (Signed)
RN called group home again and group home reports they are still unable to provide transportation for pt. Charge RN informed. Pt will be kept in ED until second staff member arrives to group home and can leave to pick up pt. Group home has been updated and reports the second staff member usually arrives at 9:00 in the morning.

## 2016-09-06 NOTE — ED Notes (Signed)
Pt sleeping, resps unlabored. Side rails padded and up x2. Bed in low and locked position. Call bell at left side. Pt on left side. Skin normal color warm and dry.

## 2016-09-06 NOTE — H&P (Signed)
[facility] around 1300 due to repeated seizure activities.  Patient with seizure activities x2 soon after transfer activity, Dr Ardyth Manam at bedsidean MD ordered IV ativan PRN and gave order for NPO.   Patient went down to MRI with Charge Nurse, Katie who reported further seizure like activities during MRI and after which ativan was given.  No further seizure activities noted since return from MRI.  Patient currently somnolent though rousable and  repositioning in bed often.  Seizures pads on bed, bed alarm activated, RN continue to monitor.

## 2016-09-06 NOTE — Progress Notes (Signed)
Just as RN finished previous note- telemetry called stating leads off. Upon entering room, pt was having grand mal seizure that lasted approximately 1 minute with some arching of back/tonic/clonic. Pt has voided incontinent. Provided safety during seizure. Dr. Nemiah CommanderKalisetti notifed and agreed to give 2nd dose of ativan-given. VSS. Affect is dull/slow. Pupils equal, reactive, 2 mm. Moves all 4 extremities with no deficits noted. Stood at bedside to void with urinal use. Bed linen and clothes changed. Pt denies any drug/etoh use or other substance. Reports HA with pt placing his hand on top of head slightly to the left "more achy" with tylenol given. Dr. Nemiah CommanderKalisetti her with reports of all findings/seizure activities/meds given. MD now in room with pt.

## 2016-09-06 NOTE — ED Notes (Signed)
Pt awakes when rn into room. Pt denies needs.

## 2016-09-06 NOTE — Consult Note (Signed)
[facility]      Assessment and Plan:  Status epilepticus --d/w neurology, seizures appear atypical, will continue current mangement.  --Seizure precautions. Continue current meds, ativan prn.  --NPO.   Bipolar disorder.  Nicotine abuse.   -DVT prophylaxis, GI prophylaxis.  Date: 09/06/2016  MRN# [id number] Joel Arroyo 02/07/1996  Referring Physician: Dr. Nemiah CommanderKalisetti for seizures.   Joel Arroyo is a 21 y.o. old male seen in consultation for chief complaint of:    Chief Complaint  Patient presents with  . Seizures    HPI:   The patient is a group home resident, he is currently very sedated/sleepy, therefore, not able to provide history or review of systems. He presented with new onset seizures, there is no known history of alcohol or drug abuse. However, the patient has a history of being on antiepileptic medications in the past. A generalized tonic-clonic seizure was witnessed in the ED around 1 AM this morning. He was given a loading dose of Keppra and admitted to the general medical floor. He then had a several repeat seizures this AM, he was given keppra, ativan, depakote, and transferred to the ICU where I witnessed another episode. This was not tonic clonic, as he appeared to be writhing with back arching. He was unresponsive for 2-3 min afterwards, then improved.    Number for pts group home: [phone]  PMHX:   Past Medical History:  Diagnosis Date  . Bipolar 1 disorder (HCC)    Surgical Hx:  Past Surgical History:  Procedure Laterality Date  . HERNIA REPAIR     Family Hx:  History reviewed. No pertinent family history. Social Hx:   Social History  Substance Use Topics  . Smoking status: Current Every Day Smoker  . Smokeless tobacco: Never Used  . Alcohol use No   Medication:    Current Facility-Administered Medications:  .  0.9 %  sodium chloride infusion, , Intravenous, Continuous, Kalisetti, Radhika, MD,  Last Rate: 100 mL/hr at 09/06/16 1213 .  acetaminophen (TYLENOL) tablet 650 mg, 650 mg, Oral, Q6H PRN, 650 mg at 09/06/16 1106 **OR** acetaminophen (TYLENOL) suppository 650 mg, 650 mg, Rectal, Q6H PRN, Arnaldo Nataliamond, Michael S, MD .  docusate sodium (COLACE) capsule 100 mg, 100 mg, Oral, BID, Arnaldo Nataliamond, Michael S, MD, 100 mg at 09/06/16 0746 .  fluvoxaMINE (LUVOX) tablet 100 mg, 100 mg, Oral, QHS, Arnaldo Nataliamond, Michael S, MD .  levETIRAcetam (KEPPRA) 500 mg in sodium chloride 0.9 % 100 mL IVPB, 500 mg, Intravenous, Q12H, Enid BaasKalisetti, Radhika, MD, Stopped at 09/06/16 0914 .  LORazepam (ATIVAN) injection 2 mg, 2 mg, Intravenous, Q1H PRN, Shane Crutchamachandran, Keshawn Fiorito, MD, 2 mg at 09/06/16 1320 .  lurasidone (LATUDA) tablet 20 mg, 20 mg, Oral, Daily, Arnaldo Nataliamond, Michael S, MD, 20 mg at 09/06/16 0746 .  ondansetron (ZOFRAN) tablet 4 mg, 4 mg, Oral, Q6H PRN **OR** ondansetron (ZOFRAN) injection 4 mg, 4 mg, Intravenous, Q6H PRN, Arnaldo Nataliamond, Michael S, MD .  Oxcarbazepine (TRILEPTAL) tablet 300 mg, 300 mg, Oral, BID, Arnaldo Nataliamond, Michael S, MD, 300 mg at 09/06/16 0746 .  sodium chloride flush (NS) 0.9 % injection 3 mL, 3 mL, Intravenous, Q12H, Enid BaasKalisetti, Radhika, MD, 3 mL at 09/06/16 0926 .  sodium chloride flush (NS) 0.9 % injection 3 mL, 3 mL, Intravenous, PRN, Enid BaasKalisetti, Radhika, MD, 3 mL at 09/06/16 1057 .  valproate (DEPACON) 500 mg in dextrose 5 % 50 mL IVPB, 500 mg, Intravenous, Q8H, Enid BaasKalisetti, Radhika, MD   Allergies:  Patient has no known  allergies.  Review of Systems: Could not be obtained due to critical illness.  Physical Examination:   VS: BP 125/83   Pulse 89   Temp 98 F (36.7 C) (Axillary)   Resp 16   Ht 5\' 10"  (1.778 m)   Wt 72.2 kg (159 lb 2.8 oz)   SpO2 96%   BMI 22.84 kg/m   General Appearance: No distress  Neuro: Lethargic  HEENT: PERRLA, EOM intact.   Pulmonary: normal breath sounds, No wheezing.  CardiovascularNormal S1,S2.  No m/r/g.   Abdomen: Benign, Soft, non-tender. Renal:  No  costovertebral tenderness  GU:  No performed at this time. Endoc: No evident thyromegaly, no signs of acromegaly. Skin:   warm, no rashes, no ecchymosis  Extremities: normal, no cyanosis, clubbing.  Other findings:    LABORATORY PANEL:   CBC  Recent Labs Lab 09/05/16 2114  WBC 8.0  HGB 15.4  HCT 45.0  PLT 281   ------------------------------------------------------------------------------------------------------------------  Chemistries   Recent Labs Lab 09/05/16 2114 09/06/16 1335  NA 140  --   K 3.9  --   CL 106  --   CO2 26  --   GLUCOSE 98  --   BUN 20  --   CREATININE 1.20  --   CALCIUM 9.6  --   MG  --  2.0   ------------------------------------------------------------------------------------------------------------------  Cardiac Enzymes No results for input(s): TROPONINI in the last 168 hours. ------------------------------------------------------------  RADIOLOGY:  Ct Head Wo Contrast  Result Date: 09/05/2016 CLINICAL DATA:  Acute onset of seizure-like activity. Foaming at the mouth. Generalized soreness. Initial encounter. EXAM: CT HEAD WITHOUT CONTRAST TECHNIQUE: Contiguous axial images were obtained from the base of the skull through the vertex without intravenous contrast. COMPARISON:  None. FINDINGS: Brain: No evidence of acute infarction, hemorrhage, hydrocephalus, extra-axial collection or mass lesion/mass effect. The posterior fossa, including the cerebellum, brainstem and fourth ventricle, is within normal limits. The third and lateral ventricles, and basal ganglia are unremarkable in appearance. The cerebral hemispheres are symmetric in appearance, with normal gray-white differentiation. No mass effect or midline shift is seen. Vascular: No hyperdense vessel or unexpected calcification. Skull: There is no evidence of fracture; visualized osseous structures are unremarkable in appearance. Sinuses/Orbits: The visualized portions of the orbits are  within normal limits. The paranasal sinuses and mastoid air cells are well-aerated. Other: No significant soft tissue abnormalities are seen. IMPRESSION: Unremarkable noncontrast CT of the head. Electronically Signed   By: Roanna Raider M.D.   On: 09/05/2016 21:38       Thank  you for the consultation and for allowing [facility] to assist in the care of your patient. Our recommendations are noted above.  Please contact us if we can be of further service.   Wells Guiles, MD.  Board Certified in Internal Medicine, Pulmonary Medicine, Critical Care Medicine, and Sleep Medicine.  [facility] Office Number: [phone]  Santiago Glad, M.D.  Billy Fischer, M.D  09/06/2016

## 2016-09-06 NOTE — ED Notes (Signed)
Number for pts group home: [phone]

## 2016-09-06 NOTE — Progress Notes (Signed)
When walking by patient room, patient began to have seizure, I turned patient to side, suctioned his mouth, and shortly after the jerking had stopped. Patient immediately starting talking to me post-ictal.

## 2016-09-06 NOTE — Consult Note (Signed)
Reason for Consult:Seizures Referring Physician: Nemiah Commander  CC: Seizures  HPI: Joel Arroyo is an 21 y.o. male with a past medical history of mood disorder and ADHD, but no history of seizures who presented to the emergency department after seizures.  He reported to nursing staff in the ED that he had been hanging out with friends in the park when he had 2 seizures. The patient recovered well in the emergency department and was about to be discharged when he had another seizure lasting less than 2 minutes. He was given Ativan and seizure activity resolved. He was loaded with Keppra. CT of his head was normal yet the patient remained postictal which prompted the emergency department staff to call the hospitalist service for admission.  After admission to the hospital patient had multiple other seizures requiring an additional dose of Keppra and multiple doses of Ativan.  Called by hospitalist due continued seizure activity.  Patient them loaded with Depakote and transferred to step down.   Seizures described by staff as patient shaking a little then arching his back.  No tongue biting.  One episode of urinary incontinence.  Felt to foam at the mouth on one occasion.    Past Medical History:  Diagnosis Date  . Bipolar 1 disorder [facility])     Past Surgical History:  Procedure Laterality Date  . HERNIA REPAIR      Family history: Unable to obtain due to mental status  Social History:  reports that he has been smoking.  He has never used smokeless tobacco. He reports that he does not drink alcohol. His drug history is not on file.  No Known Allergies  Medications:  I have reviewed the patient's current medications. Prior to Admission:  Prescriptions Prior to Admission  Medication Sig Dispense Refill Last Dose  . fluvoxaMINE (LUVOX) 100 MG tablet Take 1 tablet (100 mg total) by mouth at bedtime. 30 tablet 0 unknown  . lisdexamfetamine (VYVANSE) 60 MG capsule Take 60 mg by mouth every  morning.   unknown  . lurasidone (LATUDA) 20 MG TABS tablet Take 20 mg by mouth daily.   unknown  . Oxcarbazepine (TRILEPTAL) 300 MG tablet Take 1 tablet (300 mg total) by mouth 2 (two) times daily. 60 tablet 0 unknown  . TRAZODONE HCL PO Take 1 tablet by mouth at bedtime.   unknown   Scheduled: . docusate sodium  100 mg Oral BID  . fluvoxaMINE  100 mg Oral QHS  . heparin subcutaneous  5,000 Units Subcutaneous Q12H  . lurasidone  20 mg Oral Daily  . Oxcarbazepine  300 mg Oral BID  . sodium chloride flush  3 mL Intravenous Q12H    ROS: Unable to obtain due to mental status  Physical Examination: Blood pressure 119/81, pulse (!) 59, temperature 98 F (36.7 C), temperature source Axillary, resp. rate (!) 21, height 5\' 10"  (1.778 m), weight 72.2 kg (159 lb 2.8 oz), SpO2 99 %.  HEENT-  Normocephalic, no lesions, without obvious abnormality.  Normal external eye and conjunctiva.  Normal TM's bilaterally.  Normal auditory canals and external ears. Normal external nose, mucus membranes and septum.  Normal pharynx. Cardiovascular- S1, S2 normal, pulses palpable throughout   Lungs- chest clear, no wheezing, rales, normal symmetric air entry Abdomen- soft, non-tender; bowel sounds normal; no masses,  no organomegaly Extremities- no edema Lymph-no adenopathy palpable Musculoskeletal-no joint tenderness, deformity or swelling Skin-warm and dry, no hyperpigmentation, vitiligo, or suspicious lesions  Neurological Examination   Mental Status: Lethargic.  Does not respond verbally but able to follow simple commands.   Cranial Nerves: II: Discs flat bilaterally; Pupils equal, round, reactive to light and accommodation III,IV, VI: ptosis not present, extra-ocular motions intact bilaterally V,VII: corneals intact bilaterally VIII: hearing normal bilaterally IX,X: gag reflex present XI: bilateral shoulder shrug XII: midline tongue extension Motor: Moves all extremities to command with no focal  weakness noted.   Sensory: Responds to light touch throughout Deep Tendon Reflexes: 2+ and symmetric throughout Plantars: Right: downgoing   Left: downgoing Cerebellar: Unable to perform due to lethargy Gait: unable to perform due to safety concerns    Laboratory Studies:   Basic Metabolic Panel:  Recent Labs Lab 09/05/16 2114 09/06/16 1335  NA 140  --   K 3.9  --   CL 106  --   CO2 26  --   GLUCOSE 98  --   BUN 20  --   CREATININE 1.20  --   CALCIUM 9.6  --   MG  --  2.0    Liver Function Tests: No results for input(s): AST, ALT, ALKPHOS, BILITOT, PROT, ALBUMIN in the last 168 hours. No results for input(s): LIPASE, AMYLASE in the last 168 hours. No results for input(s): AMMONIA in the last 168 hours.  CBC:  Recent Labs Lab 09/05/16 2114  WBC 8.0  HGB 15.4  HCT 45.0  MCV 93.0  PLT 281    Cardiac Enzymes: No results for input(s): CKTOTAL, CKMB, CKMBINDEX, TROPONINI in the last 168 hours.  BNP: Invalid input(s): POCBNP  CBG:  Recent Labs Lab 09/06/16 0128 09/06/16 1255  GLUCAP 107* 91    Microbiology: Results for orders placed or performed during the hospital encounter of 09/05/16  MRSA PCR Screening     Status: None   Collection Time: 09/06/16 12:52 PM  Result Value Ref Range Status   MRSA by PCR NEGATIVE NEGATIVE Final    Comment:        The GeneXpert MRSA Assay (FDA approved for NASAL specimens only), is one component of a comprehensive MRSA colonization surveillance program. It is not intended to diagnose MRSA infection nor to guide or monitor treatment for MRSA infections.     Coagulation Studies: No results for input(s): LABPROT, INR in the last 72 hours.  Urinalysis: No results for input(s): COLORURINE, LABSPEC, PHURINE, GLUCOSEU, HGBUR, BILIRUBINUR, KETONESUR, PROTEINUR, UROBILINOGEN, NITRITE, LEUKOCYTESUR in the last 168 hours.  Invalid input(s): APPERANCEUR  Lipid Panel:  No results found for: CHOL, TRIG, HDL, CHOLHDL,  VLDL, LDLCALC  HgbA1C: No results found for: HGBA1C  Urine Drug Screen:     Component Value Date/Time   LABOPIA NONE DETECTED 09/05/2016 2124   COCAINSCRNUR NONE DETECTED 09/05/2016 2124   LABBENZ NONE DETECTED 09/05/2016 2124   AMPHETMU POSITIVE (A) 09/05/2016 2124   THCU NONE DETECTED 09/05/2016 2124   LABBARB NONE DETECTED 09/05/2016 2124    Alcohol Level: No results for input(s): ETH in the last 168 hours.  Other results: EKG: sinus rhythm at 71 bpm.  Imaging: Ct Head Wo Contrast  Result Date: 09/05/2016 CLINICAL DATA:  Acute onset of seizure-like activity. Foaming at the mouth. Generalized soreness. Initial encounter. EXAM: CT HEAD WITHOUT CONTRAST TECHNIQUE: Contiguous axial images were obtained from the base of the skull through the vertex without intravenous contrast. COMPARISON:  None. FINDINGS: Brain: No evidence of acute infarction, hemorrhage, hydrocephalus, extra-axial collection or mass lesion/mass effect. The posterior fossa, including the cerebellum, brainstem and fourth ventricle, is within normal limits. The third and lateral  ventricles, and basal ganglia are unremarkable in appearance. The cerebral hemispheres are symmetric in appearance, with normal gray-white differentiation. No mass effect or midline shift is seen. Vascular: No hyperdense vessel or unexpected calcification. Skull: There is no evidence of fracture; visualized osseous structures are unremarkable in appearance. Sinuses/Orbits: The visualized portions of the orbits are within normal limits. The paranasal sinuses and mastoid air cells are well-aerated. Other: No significant soft tissue abnormalities are seen. IMPRESSION: Unremarkable noncontrast CT of the head. Electronically Signed   By: Roanna RaiderJeffery  Chang M.D.   On: 09/05/2016 21:38     Assessment/Plan: 21 year old male presenting with new onset seizure activity.  Head CT reviewed and shows no acute changes.  Patient on Trileptal previous to admission likely  for psych issues.  On stimulants as well.  Unclear etiology but description of events does suggest a non-epileptic etiology.  Would be concerned for ingestion as well, despite negative UDS.  Keppra increased to 1500mg  with no improvement in frequency.  Depacon to be loaded.    Recommendations: 1.  MRI of the brain with and without contrast once stable. 2.  Ativan prn 3.  Depacon 1000mg  IV with maintenance of 500mg  IV q8 hours 4.  Depakote level now and in AM 5.  Seizure precautions 6.  Continue Keppra at current dose.   7.  D/C Vyvanse  Case discussed with Dr. Donaciano EvaKalisetti   Ambrielle Kington, MD Neurology [phone] 09/06/2016, 3:49 PM

## 2016-09-06 NOTE — ED Notes (Signed)
Pt transport to 118 

## 2016-09-06 NOTE — ED Notes (Signed)
Report from shannin, rn.  

## 2016-09-07 DIAGNOSIS — F29 Unspecified psychosis not due to a substance or known physiological condition: Secondary | ICD-10-CM

## 2016-09-07 DIAGNOSIS — R32 Unspecified urinary incontinence: Secondary | ICD-10-CM | POA: Diagnosis not present

## 2016-09-07 DIAGNOSIS — Z79899 Other long term (current) drug therapy: Secondary | ICD-10-CM | POA: Diagnosis not present

## 2016-09-07 DIAGNOSIS — F909 Attention-deficit hyperactivity disorder, unspecified type: Secondary | ICD-10-CM | POA: Diagnosis present

## 2016-09-07 DIAGNOSIS — F17200 Nicotine dependence, unspecified, uncomplicated: Secondary | ICD-10-CM | POA: Diagnosis present

## 2016-09-07 DIAGNOSIS — R410 Disorientation, unspecified: Secondary | ICD-10-CM | POA: Diagnosis not present

## 2016-09-07 DIAGNOSIS — Z810 Family history of intellectual disabilities: Secondary | ICD-10-CM | POA: Diagnosis not present

## 2016-09-07 DIAGNOSIS — F19959 Other psychoactive substance use, unspecified with psychoactive substance-induced psychotic disorder, unspecified: Secondary | ICD-10-CM | POA: Diagnosis present

## 2016-09-07 DIAGNOSIS — W2201XA Walked into wall, initial encounter: Secondary | ICD-10-CM | POA: Diagnosis present

## 2016-09-07 DIAGNOSIS — R569 Unspecified convulsions: Secondary | ICD-10-CM | POA: Diagnosis present

## 2016-09-07 DIAGNOSIS — F3181 Bipolar II disorder: Secondary | ICD-10-CM | POA: Diagnosis present

## 2016-09-07 LAB — CBC
HEMATOCRIT: 41.4 % (ref 40.0–52.0)
Hemoglobin: 14.2 g/dL (ref 13.0–18.0)
MCH: 32.2 pg (ref 26.0–34.0)
MCHC: 34.4 g/dL (ref 32.0–36.0)
MCV: 93.6 fL (ref 80.0–100.0)
PLATELETS: 260 10*3/uL (ref 150–440)
RBC: 4.42 MIL/uL (ref 4.40–5.90)
RDW: 12.9 % (ref 11.5–14.5)
WBC: 7.2 10*3/uL (ref 3.8–10.6)

## 2016-09-07 LAB — BASIC METABOLIC PANEL
Anion gap: 7 (ref 5–15)
BUN: 13 mg/dL (ref 6–20)
CHLORIDE: 104 mmol/L (ref 101–111)
CO2: 25 mmol/L (ref 22–32)
CREATININE: 1.03 mg/dL (ref 0.61–1.24)
Calcium: 9 mg/dL (ref 8.9–10.3)
GFR calc non Af Amer: >60 mL/min (ref >60)
Glucose, Bld: 110 mg/dL — ABNORMAL HIGH (ref 65–99)
POTASSIUM: 3.9 mmol/L (ref 3.5–5.1)
Sodium: 136 mmol/L (ref 135–145)

## 2016-09-07 LAB — VALPROIC ACID LEVEL: Valproic Acid Lvl: 84 ug/mL (ref 50.0–100.0)

## 2016-09-07 LAB — MAGNESIUM: Magnesium: 2.1 mg/dL (ref 1.7–2.4)

## 2016-09-07 LAB — PHOSPHORUS: Phosphorus: 4.1 mg/dL (ref 2.5–4.6)

## 2016-09-07 LAB — HEMOGLOBIN A1C
Hgb A1c MFr Bld: 5.3 % (ref 4.8–5.6)
Mean Plasma Glucose: 105 mg/dL

## 2016-09-07 MED ORDER — LURASIDONE HCL 20 MG PO TABS
20.0000 mg | ORAL_TABLET | Freq: Every day | ORAL | Status: DC
  Administered 2016-09-08: 20 mg via ORAL
  Filled 2016-09-07: qty 1

## 2016-09-07 MED ORDER — FLUVOXAMINE MALEATE 100 MG PO TABS
100.0000 mg | ORAL_TABLET | Freq: Every morning | ORAL | Status: DC
  Administered 2016-09-08: 100 mg via ORAL
  Filled 2016-09-07: qty 1

## 2016-09-07 NOTE — Progress Notes (Addendum)
[employer] [facility] - [place] at [facility]   PATIENT NAME: Joel Arroyo    MR#:  [id number]  DATE OF BIRTH:  [birth year]  SUBJECTIVE:  CHIEF COMPLAINT:   Chief Complaint  Patient presents with  . Seizures   - Patient sleeping in bed, when trying to wake him up, he started violently shaking his whole body and trying to foam at the mouth, however was able to sit up and spit his saliva. . Disoriented. -Concern for attention seeking behavior and pseudoseizures  REVIEW OF SYSTEMS:  Review of Systems  Unable to perform ROS: Mental acuity    DRUG ALLERGIES:  No Known Allergies  VITALS:  Blood pressure (!) 94/43, pulse (!) 49, temperature 97.7 F (36.5 C), temperature source Oral, resp. rate 18, height 5\' 10"  (1.778 m), weight 72.5 kg (159 lb 13.3 oz), SpO2 100 %.  PHYSICAL EXAMINATION:  Physical Exam  GENERAL:  21 y.o.-year-old patient lying in the bed with no acute distress. Very sleepy EYES: Pupils equal, round, reactive to light and accommodation. No scleral icterus. Extraocular muscles intact.  HEENT: Head atraumatic, normocephalic. Oropharynx and nasopharynx clear.  NECK:  Supple, no jugular venous distention. No thyroid enlargement, no tenderness.  LUNGS: Normal breath sounds bilaterally, no wheezing, rales,rhonchi or crepitation. No use of accessory muscles of respiration.  CARDIOVASCULAR: S1, S2 normal. No murmurs, rubs, or gallops.  ABDOMEN: Soft, nontender, nondistended. Bowel sounds present. No organomegaly or mass.  EXTREMITIES: No pedal edema, cyanosis, or clubbing.  NEUROLOGIC: Cranial nerves II through XII are intact. Able to move all 4 extremities in bed,   PSYCHIATRIC: The patient is sleepy, easily arousable and concern for pseudo seizures SKIN: No obvious rash, lesion, or ulcer.    LABORATORY PANEL:   CBC  Recent Labs Lab 09/07/16 0509  WBC 7.2  HGB 14.2  HCT 41.4  PLT 260    ------------------------------------------------------------------------------------------------------------------  Chemistries   Recent Labs Lab 09/07/16 0509  NA 136  K 3.9  CL 104  CO2 25  GLUCOSE 110*  BUN 13  CREATININE 1.03  CALCIUM 9.0  MG 2.1   ------------------------------------------------------------------------------------------------------------------  Cardiac Enzymes No results for input(s): TROPONINI in the last 168 hours. ------------------------------------------------------------------------------------------------------------------  RADIOLOGY:  Ct Head Wo Contrast  Result Date: 09/05/2016 CLINICAL DATA:  Acute onset of seizure-like activity. Foaming at the mouth. Generalized soreness. Initial encounter. EXAM: CT HEAD WITHOUT CONTRAST TECHNIQUE: Contiguous axial images were obtained from the base of the skull through the vertex without intravenous contrast. COMPARISON:  None. FINDINGS: Brain: No evidence of acute infarction, hemorrhage, hydrocephalus, extra-axial collection or mass lesion/mass effect. The posterior fossa, including the cerebellum, brainstem and fourth ventricle, is within normal limits. The third and lateral ventricles, and basal ganglia are unremarkable in appearance. The cerebral hemispheres are symmetric in appearance, with normal gray-white differentiation. No mass effect or midline shift is seen. Vascular: No hyperdense vessel or unexpected calcification. Skull: There is no evidence of fracture; visualized osseous structures are unremarkable in appearance. Sinuses/Orbits: The visualized portions of the orbits are within normal limits. The paranasal sinuses and mastoid air cells are well-aerated. Other: No significant soft tissue abnormalities are seen. IMPRESSION: Unremarkable noncontrast CT of the head. Electronically Signed   By: Roanna Raider M.D.   On: 09/05/2016 21:38   Mr Brain Limited Wo Contrast  Result Date: 09/07/2016 CLINICAL  DATA:  Seizures EXAM: MRI HEAD WITHOUT CONTRAST TECHNIQUE: Multiplanar, multiecho pulse sequences of the brain and surrounding structures were obtained without intravenous contrast. COMPARISON:  Head  CT 09/05/2016 FINDINGS: The examination is markedly limited by motion, rendering some sequences nondiagnostic. Brain: The midline structures are normal. There is no focal diffusion restriction to indicate acute infarct. No focal parenchymal signal abnormality is visible on the motion degraded sequences. Brain volume is normal for age without age-advanced or lobar predominant atrophy. The dura is normal and there is no extra-axial collection. Vascular: Inadequate visualization of flow voids. This is secondary to motion. Skull and upper cervical spine: The visualized skull base, calvarium, upper cervical spine and extracranial soft tissues are normal. Sinuses/Orbits: No fluid levels or advanced mucosal thickening. No mastoid effusion. Normal orbits. IMPRESSION: 1. Severely motion degraded examination. 2. Within that limitation, no visualized acute intracranial abnormality. Electronically Signed   By: Deatra RobinsonKevin  Herman M.D.   On: 09/07/2016 04:56    EKG:   Orders placed or performed during the hospital encounter of 09/05/16  . EKG 12-Lead  . EKG 12-Lead    ASSESSMENT AND PLAN:   21 year old male with past medical history significant for bipolar disorder and ADHD from a group home comes to hospital secondary to new onset seizures.  #1 new onset seizures - Initially thought to be tonic-clonic seizures,  - appreciate psych consult- concern for K2spice in home made cigarettes that patient has been getting from friends which can cause seizures, bizzare behaviour and sometimes psychosis - will improve once it gets out of his system. Appreciate psych consult -urine tox screen is negative other than amphetamines because patient is on Vyvanse. Synthetic marijuana cannot be detected on urine tox -Patient has denied  taking any recreational drugs on admission - CT of the head and MRI of the brain did not show any acute findings. -Neurology consult is appreciated. We'll discuss with neurology if Keppra and Depakote needs to be continued at this time.  #2 ADHD- vyvance was held yesterday with seizure.   #3 bipolar disorder-patient on Latuda, luvox and Trileptal  #4 DVT prophylaxis-on teds and SCDs   Patient will be transferred to floor today     All the records are reviewed and case discussed with Care Management/Social Workerr. Management plans discussed with the patient, family and they are in agreement.  CODE STATUS: Full code  TOTAL TIME SPENT IN TAKING CARE OF THIS PATIENT: 37 minutes.   POSSIBLE D/C TOMORROW, DEPENDING ON CLINICAL CONDITION.   Enid BaasKALISETTI,Hyland Mollenkopf M.D on 09/07/2016 at 8:06 AM  Between 7am to 6pm - Pager - [phone]  After 6pm go to [email or web address] - password [email or web address]  [facility]  Office  [phone]  CC: Primary care physician; Patient, No Pcp Per

## 2016-09-07 NOTE — Progress Notes (Signed)
Subjective: From investigation and interview with psychiatry it does appear that the patient has been exposed to some unprescribed substances.  Has been having hallucinations and aggressive behavior.  Complains of back pain.   Objective: Current vital signs: BP (!) 103/55 (BP Location: Left Arm)   Pulse (!) 56   Temp 98.1 F (36.7 C) (Oral)   Resp 18   Ht 5\' 10"  (1.778 m)   Wt 72.5 kg (159 lb 13.3 oz)   SpO2 98%   BMI 22.93 kg/m  Vital signs in last 24 hours: Temp:  [97.5 F (36.4 C)-98.1 F (36.7 C)] 98.1 F (36.7 C) (06/17 1156) Pulse Rate:  [47-89] 56 (06/17 1156) Resp:  [12-26] 18 (06/17 1156) BP: (92-137)/(43-95) 103/55 (06/17 1156) SpO2:  [93 %-100 %] 98 % (06/17 1156) Weight:  [72.2 kg (159 lb 2.8 oz)-72.5 kg (159 lb 13.3 oz)] 72.5 kg (159 lb 13.3 oz) (06/17 0500)  Intake/Output from previous day: 06/16 0701 - 06/17 0700 In: 419.3 [I.V.:264.3; IV Piggyback:155] Out: 1150 [Urine:1150] Intake/Output this shift: Total I/O In: -  Out: 450 [Urine:450] Nutritional status: Diet regular Room service appropriate? Yes; Fluid consistency: Thin  Neurologic Exam: Mental Status: Lethargic.  Does not respond verbally but able to follow simple commands.   Cranial Nerves: II: Discs flat bilaterally; Pupils equal, round, reactive to light and accommodation III,IV, VI: ptosis not present, extra-ocular motions intact bilaterally V,VII: corneals intact bilaterally VIII: hearing normal bilaterally IX,X: gag reflex present XI: bilateral shoulder shrug XII: midline tongue extension Motor: Moves all extremities to command with no focal weakness noted.   Sensory: Responds to light touch throughout Deep Tendon Reflexes: 2+ and symmetric throughout Plantars: Right: downgoing                                Left: downgoing   Lab Results: Basic Metabolic Panel:  Recent Labs Lab 09/05/16 2114 09/06/16 1335 09/07/16 0509  NA 140 135 136  K 3.9 3.7 3.9  CL 106 104 104  CO2 26  24 25   GLUCOSE 98 99 110*  BUN 20 15 13   CREATININE 1.20 0.91 1.03  CALCIUM 9.6 9.0 9.0  MG  --  2.0 2.1  PHOS  --  3.5 4.1    Liver Function Tests: No results for input(s): AST, ALT, ALKPHOS, BILITOT, PROT, ALBUMIN in the last 168 hours. No results for input(s): LIPASE, AMYLASE in the last 168 hours. No results for input(s): AMMONIA in the last 168 hours.  CBC:  Recent Labs Lab 09/05/16 2114 09/07/16 0509  WBC 8.0 7.2  HGB 15.4 14.2  HCT 45.0 41.4  MCV 93.0 93.6  PLT 281 260    Cardiac Enzymes: No results for input(s): CKTOTAL, CKMB, CKMBINDEX, TROPONINI in the last 168 hours.  Lipid Panel: No results for input(s): CHOL, TRIG, HDL, CHOLHDL, VLDL, LDLCALC in the last 168 hours.  CBG:  Recent Labs Lab 09/06/16 0128 09/06/16 1255  GLUCAP 107* 91    Microbiology: Results for orders placed or performed during the hospital encounter of 09/05/16  MRSA PCR Screening     Status: None   Collection Time: 09/06/16 12:52 PM  Result Value Ref Range Status   MRSA by PCR NEGATIVE NEGATIVE Final    Comment:        The GeneXpert MRSA Assay (FDA approved for NASAL specimens only), is one component of a comprehensive MRSA colonization surveillance program. It is not intended to diagnose  MRSA infection nor to guide or monitor treatment for MRSA infections.     Coagulation Studies: No results for input(s): LABPROT, INR in the last 72 hours.  Imaging: Ct Head Wo Contrast  Result Date: 09/05/2016 CLINICAL DATA:  Acute onset of seizure-like activity. Foaming at the mouth. Generalized soreness. Initial encounter. EXAM: CT HEAD WITHOUT CONTRAST TECHNIQUE: Contiguous axial images were obtained from the base of the skull through the vertex without intravenous contrast. COMPARISON:  None. FINDINGS: Brain: No evidence of acute infarction, hemorrhage, hydrocephalus, extra-axial collection or mass lesion/mass effect. The posterior fossa, including the cerebellum, brainstem and  fourth ventricle, is within normal limits. The third and lateral ventricles, and basal ganglia are unremarkable in appearance. The cerebral hemispheres are symmetric in appearance, with normal gray-white differentiation. No mass effect or midline shift is seen. Vascular: No hyperdense vessel or unexpected calcification. Skull: There is no evidence of fracture; visualized osseous structures are unremarkable in appearance. Sinuses/Orbits: The visualized portions of the orbits are within normal limits. The paranasal sinuses and mastoid air cells are well-aerated. Other: No significant soft tissue abnormalities are seen. IMPRESSION: Unremarkable noncontrast CT of the head. Electronically Signed   By: Roanna Raider M.D.   On: 09/05/2016 21:38   Mr Brain Limited Wo Contrast  Result Date: 09/07/2016 CLINICAL DATA:  Seizures EXAM: MRI HEAD WITHOUT CONTRAST TECHNIQUE: Multiplanar, multiecho pulse sequences of the brain and surrounding structures were obtained without intravenous contrast. COMPARISON:  Head CT 09/05/2016 FINDINGS: The examination is markedly limited by motion, rendering some sequences nondiagnostic. Brain: The midline structures are normal. There is no focal diffusion restriction to indicate acute infarct. No focal parenchymal signal abnormality is visible on the motion degraded sequences. Brain volume is normal for age without age-advanced or lobar predominant atrophy. The dura is normal and there is no extra-axial collection. Vascular: Inadequate visualization of flow voids. This is secondary to motion. Skull and upper cervical spine: The visualized skull base, calvarium, upper cervical spine and extracranial soft tissues are normal. Sinuses/Orbits: No fluid levels or advanced mucosal thickening. No mastoid effusion. Normal orbits. IMPRESSION: 1. Severely motion degraded examination. 2. Within that limitation, no visualized acute intracranial abnormality. Electronically Signed   By: Deatra Robinson M.D.    On: 09/07/2016 04:56    Medications:  I have reviewed the patient's current medications. Scheduled: . docusate sodium  100 mg Oral BID  . [START ON 09/08/2016] fluvoxaMINE  100 mg Oral q morning - 10a  . heparin subcutaneous  5,000 Units Subcutaneous Q8H  . lurasidone  20 mg Oral Daily  . Oxcarbazepine  300 mg Oral BID  . sodium chloride flush  3 mL Intravenous Q12H    Assessment/Plan: Patient with improved frequency of events but they do continue.  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  Patient remains on Keppra and Depakote.  Depakote level 84.    Recommendations: 1.  D/C Keppra 2.  Continue Depakote at current dose 3.  Continue on seizure precautions 4.  EEG in AM  Case discussed with psychiatry.         LOS: 0 days   Thana Farr, MD Neurology [phone] 09/07/2016  12:04 PM

## 2016-09-07 NOTE — [place] FL2 (Addendum)
[place] MEDICAID FL2 LEVEL OF CARE SCREENING TOOL     IDENTIFICATION  Patient Name: Joel Arroyo Birthdate: 03/29/1995 Sex: male Admission Date (Current Location): 09/05/2016  [facility] [place]   Facility and Address:  [facility], [address], [place], [place] [address]      Provider Number: [id number]  Attending Physician Name and Address:  Enid BaasKalisetti, Radhika, MD  Relative Name and Phone Number:       Current Level of Care: Hospital [facility]val Number:    Date Approved/Denied:   PASRR Number:    Discharge Plan: Other (Comment) (Group Home)    Current Diagnoses: Patient Active Problem List   Diagnosis Date Noted  . Seizures (HCC) 09/06/2016  . Bipolar 2 disorder, major depressive episode (HCC) 09/15/2015  . Suicidal ideation 09/15/2015  . Homicidal ideation 09/15/2015  . ADHD (attention deficit hyperactivity disorder) 09/14/2015  . Involuntary commitment 09/14/2015    Orientation RESPIRATION BLADDER Height & Weight     Self, Time, Situation, Place  Normal Continent Weight: 159 lb 13.3 oz (72.5 kg) Height:  5\' 10"  (177.8 cm)  BEHAVIORAL SYMPTOMS/MOOD NEUROLOGICAL BOWEL NUTRITION STATUS    Convulsions/Seizures Continent    AMBULATORY STATUS COMMUNICATION OF NEEDS Skin   Independent Verbally Normal                       Personal Care Assistance Level of Assistance  Bathing, Feeding, Dressing Bathing Assistance: Independent Feeding assistance: Independent Dressing Assistance: Independent     Functional Limitations Info             SPECIAL CARE FACTORS FREQUENCY                       Contractures      Additional Factors Info                  Current Medications (09/07/2016):  This is the current hospital active medication list Current Facility-Administered Medications  Medication Dose Route Frequency Provider Last Rate Last Dose   . acetaminophen (TYLENOL) tablet 650 mg  650 mg Oral Q6H PRN Arnaldo Nataliamond, Michael S, MD   650 mg at 09/06/16 1106   Or  . acetaminophen (TYLENOL) suppository 650 mg  650 mg Rectal Q6H PRN Arnaldo Nataliamond, Michael S, MD      . dextrose 5 %-0.45 % sodium chloride infusion   Intravenous Continuous Shane Crutchamachandran, Pradeep, MD 75 mL/hr at 09/06/16 2300    . docusate sodium (COLACE) capsule 100 mg  100 mg Oral BID Arnaldo Nataliamond, Michael S, MD   100 mg at 09/06/16 0746  . famotidine (PEPCID) IVPB 20 mg premix  20 mg Intravenous Q12H Shane Crutchamachandran, Pradeep, MD   Stopped at 09/06/16 2353  . fluvoxaMINE (LUVOX) tablet 100 mg  100 mg Oral QHS Arnaldo Nataliamond, Michael S, MD      . heparin injection 5,000 Units  5,000 Units Subcutaneous Q8H Gardner CandleHallaji, Sheema M, RPH   5,000 Units at 09/07/16 0518  . levETIRAcetam (KEPPRA) 500 mg in sodium chloride 0.9 % 100 mL IVPB  500 mg Intravenous Q12H Enid BaasKalisetti, Radhika, MD   Stopped at 09/06/16 2323  . LORazepam (ATIVAN) injection 2 mg  2 mg Intravenous Q1H PRN Shane Crutchamachandran, Pradeep, MD   2 mg at 09/06/16 1454  . lurasidone (LATUDA) tablet 20 mg  20 mg Oral Daily Arnaldo Nataliamond, Michael S, MD   20 mg at 09/06/16 0746  .  ondansetron (ZOFRAN) tablet 4 mg  4 mg Oral Q6H PRN Arnaldo Natal, MD       Or  . ondansetron [facility]) injection 4 mg  4 mg Intravenous Q6H PRN Arnaldo Natal, MD   4 mg at 09/07/16 0132  . Oxcarbazepine (TRILEPTAL) tablet 300 mg  300 mg Oral BID Arnaldo Natal, MD   300 mg at 09/06/16 0746  . sodium chloride flush (NS) 0.9 % injection 3 mL  3 mL Intravenous Q12H Enid Baas, MD   3 mL at 09/06/16 2300  . sodium chloride flush (NS) 0.9 % injection 3 mL  3 mL Intravenous PRN Enid Baas, MD   3 mL at 09/06/16 1057  . valproate (DEPACON) 500 mg in dextrose 5 % 50 mL IVPB  500 mg Intravenous Q8H Enid Baas, MD 55 mL/hr at 09/07/16 0911 500 mg at 09/07/16 0911     Discharge Medications: Please see discharge summary for a list of discharge  medications.  Current Discharge Medication List       START taking these medications   Details  divalproex (DEPAKOTE) 500 MG DR tablet Give oral twice daily on 6/18, 09/09/16- then on 09/10/16 give once a day oral and then stop. Qty: 4 tablet, Refills: 0         CONTINUE these medications which have NOT CHANGED   Details  fluvoxaMINE (LUVOX) 100 MG tablet Take 1 tablet (100 mg total) by mouth at bedtime. Qty: 30 tablet, Refills: 0    lisdexamfetamine (VYVANSE) 60 MG capsule Take 60 mg by mouth every morning.    lurasidone (LATUDA) 20 MG TABS tablet Take 20 mg by mouth daily.    Oxcarbazepine (TRILEPTAL) 300 MG tablet Take 1 tablet (300 mg total) by mouth 2 (two) times daily. Qty: 60 tablet, Refills: 0    TRAZODONE HCL PO Take 1 tablet by mouth at bedtime.       Relevant Imaging Results:  Relevant Lab Results:   Additional Information SS# [id number]  Judi Cong, LCSW   Ervin Knack. Anterhaus, MSW, Theresia Majors [phone]  09/08/2016 4:18 PM updated

## 2016-09-07 NOTE — Consult Note (Addendum)
[facility] Consult   Reason for Consult: R/O Pseudoseiuzres Referring Physician:  Laneta Simmers, MD Patient Identification: Joel Arroyo MRN:  [id number] Principal Diagnosis: Substance Induced Psychosis Diagnosis:   Patient Active Problem List   Diagnosis Date Noted  . Seizures ([place]) [R56.9] 09/06/2016  . Bipolar 2 disorder, major depressive episode ([place]) [F31.81] 09/15/2015  . Suicidal ideation [R45.851] 09/15/2015  . Homicidal ideation [R45.850] 09/15/2015  . ADHD (attention deficit hyperactivity disorder) [F90.9] 09/14/2015  . Involuntary commitment [Z04.6] 09/14/2015    Total Time spent with patient: 45 minutes  Subjective:   Joel Arroyo is an 21 year old Caucasian male with prior diagnosis of bipolar disorder type II and ADHD who was admitted to the medical service after having multiple seizures in the context of ingesting an unknown substance over the past 1-2 months. The patient was somewhat lethargic and possibly post ictal as he was having seizure activity on and off during the visit. He did admit to starting to smoke these "new cigarettes" that were homemade and given to him by his peers in the day treatment program that he attends. The patient did recognize the name "K2". He reports that he had also been dating a new substance but does not know exactly what it was. He says over the past 1-2 months he started the new cigarettes, he has been having both auditory and visual hallucinations. He says he was having visual hallucinations of seeing ghosts and auditory hallucinations of a woman saying "she was not ready and she wants to be here". He denied any recent depressive symptoms or suicidal thoughts. The patient denied any feelings of hopelessness or anhedonia. He did not give a lot of information about whether he liked living in a group home or attending the day treatment program. The patient was not the most reliable historian. Collateral information from his  stepmother and legal guardian indicated that he had increased physical and sexual aggression over the past one month and had been suspended from the day treatment program multiple times during this same time. The patient himself cannot provide much information as he was lethargic and having seizure-like activity (arching his back, shaking of all four extremities biting his tongue).  Past Psychiatry History The patient has had one prior inpatient psychiatric hospitalization in June 2018. He denies any prior suicide attempts. The patient is currently followed by psychiatrist at the [facility] clinic in [place]. He is currently on Latuda, Luvox, Vyvanse and Trileptal. He does have a legal guardian as he is incompetent. His legal guardian is his stepmother Joel Arroyo, at [phone] (cell) or [phone] (home).  Substance Abuse History The patient was ingesting an unknown substance over the past 1-2 months in "homemade cigarettes", possibly K2/Spice. No history of any heavy alcohol use, cocaine, cannabis or opioid use.  Family Psychiatric History The patient's maternal side had some Mental Retardation.   Legal History He has been arrested for breaking and entering and larceny.  Risk to Self: Is patient at risk for suicide?: No Risk to Others:   No Prior Inpatient Therapy:  Yes Prior Outpatient Therapy:  Yes  Past Medical History:  Past Medical History:  Diagnosis Date  . Bipolar 1 disorder [facility])     Past Surgical History:  Procedure Laterality Date  . HERNIA REPAIR       Social History:  History  Alcohol Use No     History  Drug use: Unknown    Social History   Social History  . Marital status: Single  Spouse name: N/A  . Number of children: N/A  . Years of education: N/A   Social History Main Topics  . Smoking status: Current Every Day Smoker  . Smokeless tobacco: Never Used  . Alcohol use No  . Drug use: Unknown  . Sexual activity: Not Asked   Other Topics  Concern  . None   Social History Narrative  . None       Allergies:  No Known Allergies  Labs:  Results for orders placed or performed during the hospital encounter of 09/05/16 (from the past 48 hour(s))  Basic metabolic panel - if new onset seizures     Status: None   Collection Time: 09/05/16  9:14 PM  Result Value Ref Range   Sodium 140 135 - 145 mmol/L   Potassium 3.9 3.5 - 5.1 mmol/L   Chloride 106 101 - 111 mmol/L   CO2 26 22 - 32 mmol/L   Glucose, Bld 98 65 - 99 mg/dL   BUN 20 6 - 20 mg/dL   Creatinine, Ser 1.20 0.61 - 1.24 mg/dL   Calcium 9.6 8.9 - 10.3 mg/dL   GFR calc non Af Amer >60 >60 mL/min   GFR calc Af Amer >60 >60 mL/min    Comment: (NOTE) The eGFR has been calculated using the CKD EPI equation. This calculation has not been validated in all clinical situations. eGFR's persistently <60 mL/min signify possible Chronic Kidney Disease.    Anion gap 8 5 - 15  CBC - if new onset seizures     Status: None   Collection Time: 09/05/16  9:14 PM  Result Value Ref Range   WBC 8.0 3.8 - 10.6 K/uL   RBC 4.84 4.40 - 5.90 MIL/uL   Hemoglobin 15.4 13.0 - 18.0 g/dL   HCT 45.0 40.0 - 52.0 %   MCV 93.0 80.0 - 100.0 fL   MCH 31.8 26.0 - 34.0 pg   MCHC 34.2 32.0 - 36.0 g/dL   RDW 13.2 11.5 - 14.5 %   Platelets 281 150 - 440 K/uL  TSH     Status: None   Collection Time: 09/05/16  9:14 PM  Result Value Ref Range   TSH 2.105 0.350 - 4.500 uIU/mL    Comment: Performed by a 3rd Generation assay with a functional sensitivity of <=0.01 uIU/mL.  Urine Drug Screen, Qualitative ([facility] only)     Status: Abnormal   Collection Time: 09/05/16  9:24 PM  Result Value Ref Range   Tricyclic, Ur Screen NONE DETECTED NONE DETECTED   Amphetamines, Ur Screen POSITIVE (A) NONE DETECTED   MDMA (Ecstasy)Ur Screen NONE DETECTED NONE DETECTED   Cocaine Metabolite,Ur [place] NONE DETECTED NONE DETECTED   Opiate, Ur Screen NONE DETECTED NONE DETECTED   Phencyclidine (PCP) Ur S NONE DETECTED NONE  DETECTED   Cannabinoid 50 Ng, Ur [place] NONE DETECTED NONE DETECTED   Barbiturates, Ur Screen NONE DETECTED NONE DETECTED   Benzodiazepine, Ur Scrn NONE DETECTED NONE DETECTED   Methadone Scn, Ur NONE DETECTED NONE DETECTED    Comment: (NOTE) 211  Tricyclics, urine               Cutoff 1000 ng/mL 200  Amphetamines, urine             Cutoff 1000 ng/mL 300  MDMA (Ecstasy), urine           Cutoff 500 ng/mL 400  Cocaine Metabolite, urine       Cutoff 300 ng/mL 500  Opiate, urine  Cutoff 300 ng/mL 600  Phencyclidine (PCP), urine      Cutoff 25 ng/mL 700  Cannabinoid, urine              Cutoff 50 ng/mL 800  Barbiturates, urine             Cutoff 200 ng/mL 900  Benzodiazepine, urine           Cutoff 200 ng/mL 1000 Methadone, urine                Cutoff 300 ng/mL 1100 1200 The urine drug screen provides only a preliminary, unconfirmed 1300 analytical test result and should not be used for non-medical 1400 purposes. Clinical consideration and professional judgment should 1500 be applied to any positive drug screen result due to possible 1600 interfering substances. A more specific alternate chemical method 1700 must be used in order to obtain a confirmed analytical result.  1800 Gas chromato graphy / mass spectrometry (GC/MS) is the preferred 1900 confirmatory method.   Glucose, capillary     Status: Abnormal   Collection Time: 09/06/16  1:28 AM  Result Value Ref Range   Glucose-Capillary 107 (H) 65 - 99 mg/dL  MRSA PCR Screening     Status: None   Collection Time: 09/06/16 12:52 PM  Result Value Ref Range   MRSA by PCR NEGATIVE NEGATIVE    Comment:        The GeneXpert MRSA Assay (FDA approved for NASAL specimens only), is one component of a comprehensive MRSA colonization surveillance program. It is not intended to diagnose MRSA infection nor to guide or monitor treatment for MRSA infections.   Glucose, capillary     Status: None   Collection Time: 09/06/16 12:55  PM  Result Value Ref Range   Glucose-Capillary 91 65 - 99 mg/dL  Phosphorus     Status: None   Collection Time: 09/06/16  1:35 PM  Result Value Ref Range   Phosphorus 3.5 2.5 - 4.6 mg/dL  Magnesium     Status: None   Collection Time: 09/06/16  1:35 PM  Result Value Ref Range   Magnesium 2.0 1.7 - 2.4 mg/dL  Basic metabolic panel     Status: None   Collection Time: 09/06/16  1:35 PM  Result Value Ref Range   Sodium 135 135 - 145 mmol/L   Potassium 3.7 3.5 - 5.1 mmol/L   Chloride 104 101 - 111 mmol/L   CO2 24 22 - 32 mmol/L   Glucose, Bld 99 65 - 99 mg/dL   BUN 15 6 - 20 mg/dL   Creatinine, Ser 0.91 0.61 - 1.24 mg/dL   Calcium 9.0 8.9 - 10.3 mg/dL   GFR calc non Af Amer >60 >60 mL/min   GFR calc Af Amer >60 >60 mL/min    Comment: (NOTE) The eGFR has been calculated using the CKD EPI equation. This calculation has not been validated in all clinical situations. eGFR's persistently <60 mL/min signify possible Chronic Kidney Disease.    Anion gap 7 5 - 15  Valproic acid level     Status: None   Collection Time: 09/06/16  1:35 PM  Result Value Ref Range   Valproic Acid Lvl 63 50.0 - 100.0 ug/mL  Valproic acid level     Status: None   Collection Time: 09/07/16  5:09 AM  Result Value Ref Range   Valproic Acid Lvl 84 50.0 - 100.0 ug/mL  Basic metabolic panel     Status: Abnormal   Collection Time:  09/07/16  5:09 AM  Result Value Ref Range   Sodium 136 135 - 145 mmol/L   Potassium 3.9 3.5 - 5.1 mmol/L   Chloride 104 101 - 111 mmol/L   CO2 25 22 - 32 mmol/L   Glucose, Bld 110 (H) 65 - 99 mg/dL   BUN 13 6 - 20 mg/dL   Creatinine, Ser 1.03 0.61 - 1.24 mg/dL   Calcium 9.0 8.9 - 10.3 mg/dL   GFR calc non Af Amer >60 >60 mL/min   GFR calc Af Amer >60 >60 mL/min    Comment: (NOTE) The eGFR has been calculated using the CKD EPI equation. This calculation has not been validated in all clinical situations. eGFR's persistently <60 mL/min signify possible Chronic Kidney Disease.     Anion gap 7 5 - 15  CBC     Status: None   Collection Time: 09/07/16  5:09 AM  Result Value Ref Range   WBC 7.2 3.8 - 10.6 K/uL   RBC 4.42 4.40 - 5.90 MIL/uL   Hemoglobin 14.2 13.0 - 18.0 g/dL   HCT 41.4 40.0 - 52.0 %   MCV 93.6 80.0 - 100.0 fL   MCH 32.2 26.0 - 34.0 pg   MCHC 34.4 32.0 - 36.0 g/dL   RDW 12.9 11.5 - 14.5 %   Platelets 260 150 - 440 K/uL  Magnesium     Status: None   Collection Time: 09/07/16  5:09 AM  Result Value Ref Range   Magnesium 2.1 1.7 - 2.4 mg/dL  Phosphorus     Status: None   Collection Time: 09/07/16  5:09 AM  Result Value Ref Range   Phosphorus 4.1 2.5 - 4.6 mg/dL    Current Facility-Administered Medications  Medication Dose Route Frequency Provider Last Rate Last Dose  . acetaminophen (TYLENOL) tablet 650 mg  650 mg Oral Q6H PRN Harrie Foreman, MD   650 mg at 09/06/16 1106   Or  . acetaminophen (TYLENOL) suppository 650 mg  650 mg Rectal Q6H PRN Harrie Foreman, MD      . dextrose 5 %-0.45 % sodium chloride infusion   Intravenous Continuous Laverle Hobby, MD 75 mL/hr at 09/06/16 2300    . docusate sodium (COLACE) capsule 100 mg  100 mg Oral BID Harrie Foreman, MD   100 mg at 09/07/16 1037  . famotidine (PEPCID) IVPB 20 mg premix  20 mg Intravenous Q12H Laverle Hobby, MD   Stopped at 09/07/16 1137  . [START ON 09/08/2016] fluvoxaMINE (LUVOX) tablet 100 mg  100 mg Oral q morning - 10a Chauncey Mann, MD      . heparin injection 5,000 Units  5,000 Units Subcutaneous Q8H Pernell Dupre, RPH   5,000 Units at 09/07/16 0518  . levETIRAcetam (KEPPRA) 500 mg in sodium chloride 0.9 % 100 mL IVPB  500 mg Intravenous Q12H Gladstone Lighter, MD   Stopped at 09/07/16 1137  . LORazepam (ATIVAN) injection 2 mg  2 mg Intravenous Q1H PRN Laverle Hobby, MD   2 mg at 09/06/16 1454  . lurasidone (LATUDA) tablet 20 mg  20 mg Oral Daily Harrie Foreman, MD   20 mg at 09/07/16 1033  . ondansetron (ZOFRAN) tablet 4 mg  4 mg Oral Q6H  PRN Harrie Foreman, MD       Or  . ondansetron [facility]) injection 4 mg  4 mg Intravenous Q6H PRN Harrie Foreman, MD   4 mg at 09/07/16 0132  . Oxcarbazepine (TRILEPTAL) tablet 300  mg  300 mg Oral BID Harrie Foreman, MD   300 mg at 09/07/16 1034  . sodium chloride flush (NS) 0.9 % injection 3 mL  3 mL Intravenous Q12H Gladstone Lighter, MD   3 mL at 09/06/16 2300  . sodium chloride flush (NS) 0.9 % injection 3 mL  3 mL Intravenous PRN Gladstone Lighter, MD   3 mL at 09/06/16 1057  . valproate (DEPACON) 500 mg in dextrose 5 % 50 mL IVPB  500 mg Intravenous Q8H Gladstone Lighter, MD   Stopped at 09/07/16 1022    Musculoskeletal:  Unable to assess. Having seizure like activity at present   Psychiatric Specialty Exam: Physical Exam  Review of Systems  Unable to perform ROS: Acuity of condition    Blood pressure (!) 94/43, pulse (!) 49, temperature 97.7 F (36.5 C), temperature source Oral, resp. rate 18, height _0  (1.778 m), weight 72.5 kg (159 lb 13.3 oz), SpO2 100 %.Body mass index is 22.93 kg/m.  General Appearance: Disheveled  Eye Contact:  Poor  Speech:  Slow and Slurred  Volume:  Decreased  Mood:  Dysphoric  Affect:  Constricted  Thought Process: Brief responses, logical  Orientation:  Full (Time, Place, and Person)  Thought Content:  Logical  Suicidal Thoughts:  No  Homicidal Thoughts:  No  Memory:  Immediate;   Fair Recent;   Fair Remote;   Fair  Judgement:  Fair  Insight:  Lacking  Psychomotor Activity:  Increased, Had seizure like activity  Concentration:  Concentration: Poor and Attention Span: Poor  Recall:  Poor  Fund of Knowledge:  Unable to fully assess  Language:  Good  Akathisia:  No  Handed:  Right  AIMS (if indicated):     Assets:  Financial Resources/Insurance Housing  ADL's:  Impaired  Cognition:  Impaired,  Moderate  Sleep:        Treatment Plan Summary:    Psychosis, NOS R/O Substance Induced Psychosis Bipolar Disorder,  Type II ADHD Severe: Incompetent, living in a group home  Mr. Fitzner is a 21 year old Caucasian male with history of bipolar disorder and ADHD who was admitted to the medical floor after having multiple seizures in the context of an unknown substance. The patient has been smoking homemade cigarettes, possibly K2 Spice, there were given to him by his peers in the day treatment program or he goes. Prior to this admission, he has had no prior seizures and no prior pseudoseizures. Over the past 2 months that he has been smoking these cigarettes and vaping he has had psychosis including auditory and visual hallucinations as well as increased aggression both physical and sexual.  Substance Induced Psychosis: The patient most likely was smoking K2 or another unknown substance that was contributing to the seizures. There is no history of any prior seizures. He was placed on seizure precautions and is being seen by neurology. Will await neurology input. CT of the head was negative. Will have to wait until substances have clear to system. He is artery on an antipsychotic, Latuda but dosage may need to be increased if psychosis continues. When the patient is more alert and responsive, will talk about the need to abstain from any illicit drugs. Per his family, there is no history of any other illicit drug use in the past and toxicology screen was negative for all substances other than amphetamines, consistent with the Vyvanse he is prescribed.  Bipolar disorder, type II: The patient is currently on a combination of Latuda 20  mg by mouth daily with dinner, Luvox 100 mg by mouth daily and Trileptal 300 mg by mouth twice a day. We'll continue those psychotropic medications for the present time. He was last discharged from inpatient psychiatry in June 2017 and is only had 1 prior inpatient admission in psychiatry. Per his guardian and stepmother, there has been increased physical and sexual aggression over the past 1-2  months and he has been suspended repeatedly from the day treatment program. Behavioral problems may be the result of him ingesting this unknown substance, possibly K2. His behavior is consistent with K2 ingestion. If psychosis continues, will have to increase Latuda dosage.  Seizures: Will defer to Neurology for medication management. He is on Depakote which may help with aggression as well as mood stabilization. Consider keeping Depakote on board for now but will defer to neurology.   ADHD: The patient is normally on Vyvanse 60 mg by mouth daily but will hold stimulant for now secondary to seizures.  Suicide Risk: At this time, patient is not endorsing any active or passive suicidal thoughts and there is no history of any prior suicide attempts. He however is an increased risk of harming himself secondary to seizure-like activity. He is currently being monitored for seizure activity. Protective factors include his father and stepmother. He also has had stable housing and being compliant with outpatient psychotropic medication management.  Disposition: The patient is still currently having seizure-like activity today. He will remain on the medical floor and we'll continue to reevaluate to see if psychosis persists. Psychosis however is most likely from substance and not consistent with his psych history in the pat. If he continues to have problems with psychosis, will have to transfer to inpatient psychiatry. Dr. Weber Cooks will be rounding beginning Monday, 09/08/16   Daily contact with patient to assess and evaluate symptoms and progress in treatment and Medication management   Jay Schlichter, MD 09/07/2016 11:45 AM

## 2016-09-07 NOTE — Clinical Social Work Note (Addendum)
Clinical Social Work Assessment  Patient Details  Name: Joel Arroyo MRN: [id number] Date of Birth: 30-Dec-1995  Date of referral:  09/07/16               Reason for consult:  Facility Placement                Permission sought to share information with:  Facility Industrial/product designer granted to share information::     Name::        Agency::     Relationship::     Contact Information:     Housing/Transportation Living arrangements for the past 2 months:  Group Home Source of Information:  Facility Patient Interpreter Needed:  None Criminal Activity/Legal Involvement Pertinent to Current Situation/Hospitalization:    Significant Relationships:  Merchandiser, retail Lives with:  Facility Resident Do you feel safe going back to the place where you live?  Yes Need for family participation in patient care:  Yes (Comment)  Care giving concerns:  Patient admitted from [facility]   Social Worker assessment / plan:  CSW contacted the facility as the patient was being transferred to ICU when the CSW was able to attempt a visit. Joel Arroyo, the facilitator at the group home, reported that the patient is able to return when stable, and she expressed concern for the patient as this is the first time he has been ill. The CSW gave Joel Arroyo a brief update of his care and provided emotional support.  According to the chart, the patient has no history of substance use, and his only toxicology positive is for amphetamines, which is to be expected due to his medication for ADHD (Vyvanse). The RN and MD are concerned that his seizure activity is pseudo in nature and have placed a consult for psychiatry.   The patient's legal guardian is his not his mother, Joel Arroyo. CSW attempted to reach her with no answer or ability to leave a message. The group home has been in contact with her throughout the admission. The patient has a legal guardian through DSS of [place]. CSW left a message for his  guardian to alert her that the patient is in the hospital. His assigned social worker is Joel Arroyo: [phone] M-F and [phone] on the weekend.  CSW will continue to follow to assist with discharge facilitation. The patient will be transported by the group home.  Employment status:  Disabled (Comment on whether or not currently receiving Disability) Insurance information:  Medicaid In [place] PT Recommendations:  Not assessed at this time Information / Referral to community resources:     Patient/Family's Response to care:  The group home administrator showed great concern for the patient.  Patient/Family's Understanding of and Emotional Response to Diagnosis, Current Treatment, and Prognosis:  The patient was unavailable and his mother/legal guardian did not respond. The patient's group home is aware of the current treatment and are in agreement with the patient returning when stable.  Emotional Assessment Appearance:  Appears stated age Attitude/Demeanor/Rapport:  Inconsistent Affect (typically observed):  Agitated Orientation:  Oriented to Self, Oriented to Place, Oriented to  Time, Oriented to Situation Alcohol / Substance use:  Never Used Psych involvement (Current and /or in the community):  Yes (Comment) (Psych consult for possible pseudosiezures)  Discharge Needs  Concerns to be addressed:  Care Coordination Readmission within the last 30 days:  No Current discharge risk:  None Barriers to Discharge:  Continued Medical Work up   [employer], LCSW  09/07/2016, 9:38 AM

## 2016-09-08 ENCOUNTER — Inpatient Hospital Stay: Payer: Medicaid Other

## 2016-09-08 DIAGNOSIS — R569 Unspecified convulsions: Secondary | ICD-10-CM

## 2016-09-08 MED ORDER — DIVALPROEX SODIUM 500 MG PO DR TAB
500.0000 mg | DELAYED_RELEASE_TABLET | Freq: Two times a day (BID) | ORAL | Status: DC
  Filled 2016-09-08: qty 1

## 2016-09-08 MED ORDER — DIVALPROEX SODIUM 500 MG PO DR TAB: DELAYED_RELEASE_TABLET | ORAL | 0 refills | Status: DC

## 2016-09-08 MED ORDER — FAMOTIDINE 20 MG PO TABS: 20.0000 mg | ORAL_TABLET | Freq: Two times a day (BID) | ORAL | Status: DC

## 2016-09-08 NOTE — Progress Notes (Signed)
Patient discharged to group home. Patient is unsteady on his feet had a near fall episode. The tele sitter is in place, will discontinue. [place] [facility] Patient states I know who she is." Patient has no questions. Leota SauersAdvised Latoya that discharge instructions and prescriptions are with the patient.

## 2016-09-08 NOTE — Clinical Social Work Note (Addendum)
CSW contacted group home supervisor at [phone], and she will contact one of her workers to arrange pick up for patient.  CSW updated bedside nurse, packet is on patient's chart.  [facility]  Facility will arrange pick up for patient.  Ervin KnackEric R. Soyla Bainter, MSW, Theresia MajorsLCSWA [phone]  09/08/2016 4:53 PM

## 2016-09-08 NOTE — Plan of Care (Signed)
Problem: Safety: Goal: Ability to remain free from injury will improve Outcome: Not Progressing Does not seem aware of safety issues in his environment.  This doesn't seems to be a new problem.

## 2016-09-08 NOTE — Progress Notes (Addendum)
Subjective: Patient more talkative today and reports wanting to go home.  From report of nursing has had no further back arching episodes.    Objective: Current vital signs: BP (!) 128/57 (BP Location: Right Leg)   Pulse 86   Temp 97.4 F (36.3 C)   Resp 18   Ht 5\' 10"  (1.778 m)   Wt 71.8 kg (158 lb 4.6 oz)   SpO2 98%   BMI 22.71 kg/m  Vital signs in last 24 hours: Temp:  [97.4 F (36.3 C)-98.6 F (37 C)] 97.4 F (36.3 C) (06/18 1233) Pulse Rate:  [45-86] 86 (06/18 1233) Resp:  [12-18] 18 (06/18 1233) BP: (101-128)/(51-57) 128/57 (06/18 1233) SpO2:  [97 %-98 %] 98 % (06/18 1233) Weight:  [71.8 kg (158 lb 4.6 oz)] 71.8 kg (158 lb 4.6 oz) (06/18 0500)  Intake/Output from previous day: 06/17 0701 - 06/18 0700 In: 3122.5 [P.O.:240; I.V.:2512.5; IV Piggyback:370] Out: 1200 [Urine:1200] Intake/Output this shift: Total I/O In: 240 [P.O.:240] Out: -  Nutritional status: Diet regular Room service appropriate? Yes; Fluid consistency: Thin  Neurologic Exam: Mental Status: Alert.  Follows simple commands.  Minimal speech but fluent.   Cranial Nerves: II: Discs flat bilaterally; Pupils equal, round, reactive to light and accommodation III,IV, VI: ptosis not present, extra-ocular motions intact bilaterally V,VII: corneals intactbilaterally VIII: hearing normal bilaterally IX,X: gag reflex present XI: bilateral shoulder shrug XII: midline tongue extension Motor: Moves all extremities to command with no focal weakness noted.  Sensory: Responds to light touch throughout Deep Tendon Reflexes: 2+ and symmetric throughout Plantars: Right: downgoingLeft: downgoing  Lab Results: Basic Metabolic Panel:  Recent Labs Lab 09/05/16 2114 09/06/16 1335 09/07/16 0509  NA 140 135 136  K 3.9 3.7 3.9  CL 106 104 104  CO2 26 24 25   GLUCOSE 98 99 110*  BUN 20 15 13   CREATININE 1.20 0.91 1.03  CALCIUM 9.6 9.0 9.0  MG  --  2.0 2.1  PHOS  --  3.5  4.1    Liver Function Tests: No results for input(s): AST, ALT, ALKPHOS, BILITOT, PROT, ALBUMIN in the last 168 hours. No results for input(s): LIPASE, AMYLASE in the last 168 hours. No results for input(s): AMMONIA in the last 168 hours.  CBC:  Recent Labs Lab 09/05/16 2114 09/07/16 0509  WBC 8.0 7.2  HGB 15.4 14.2  HCT 45.0 41.4  MCV 93.0 93.6  PLT 281 260    Cardiac Enzymes: No results for input(s): CKTOTAL, CKMB, CKMBINDEX, TROPONINI in the last 168 hours.  Lipid Panel: No results for input(s): CHOL, TRIG, HDL, CHOLHDL, VLDL, LDLCALC in the last 168 hours.  CBG:  Recent Labs Lab 09/06/16 0128 09/06/16 1255  GLUCAP 107* 91    Microbiology: Results for orders placed or performed during the hospital encounter of 09/05/16  MRSA PCR Screening     Status: None   Collection Time: 09/06/16 12:52 PM  Result Value Ref Range Status   MRSA by PCR NEGATIVE NEGATIVE Final    Comment:        The GeneXpert MRSA Assay (FDA approved for NASAL specimens only), is one component of a comprehensive MRSA colonization surveillance program. It is not intended to diagnose MRSA infection nor to guide or monitor treatment for MRSA infections.     Coagulation Studies: No results for input(s): LABPROT, INR in the last 72 hours.  Imaging: Mr Brain Limited Wo Contrast  Result Date: 09/07/2016 CLINICAL DATA:  Seizures EXAM: MRI HEAD WITHOUT CONTRAST TECHNIQUE: Multiplanar, multiecho pulse  sequences of the brain and surrounding structures were obtained without intravenous contrast. COMPARISON:  Head CT 09/05/2016 FINDINGS: The examination is markedly limited by motion, rendering some sequences nondiagnostic. Brain: The midline structures are normal. There is no focal diffusion restriction to indicate acute infarct. No focal parenchymal signal abnormality is visible on the motion degraded sequences. Brain volume is normal for age without age-advanced or lobar predominant atrophy. The  dura is normal and there is no extra-axial collection. Vascular: Inadequate visualization of flow voids. This is secondary to motion. Skull and upper cervical spine: The visualized skull base, calvarium, upper cervical spine and extracranial soft tissues are normal. Sinuses/Orbits: No fluid levels or advanced mucosal thickening. No mastoid effusion. Normal orbits. IMPRESSION: 1. Severely motion degraded examination. 2. Within that limitation, no visualized acute intracranial abnormality. Electronically Signed   By: Deatra RobinsonKevin  Herman M.D.   On: 09/07/2016 04:56    Medications:  I have reviewed the patient's current medications. Scheduled: . docusate sodium  100 mg Oral BID  . fluvoxaMINE  100 mg Oral q morning - 10a  . heparin subcutaneous  5,000 Units Subcutaneous Q8H  . lurasidone  20 mg Oral QPC supper  . Oxcarbazepine  300 mg Oral BID  . sodium chloride flush  3 mL Intravenous Q12H    Assessment/Plan: Episodes reduced.  Off Keppra.  Remains on Depakote IV.  Will need to start to taper and change to po.  MRI motion degraded but with no significant abnormalities.    Recommendations: 1. D/C Depacon 2. Start Depakote 500mg  BID and decrease by 500mg  daily until off.   3. EEG pending   LOS: 1 day   Thana FarrLeslie Kimbly Eanes, MD Neurology [id number] 09/08/2016  12:43 PM

## 2016-09-08 NOTE — Discharge Summary (Signed)
[facility] [place] [facility]   PATIENT NAME: Joel Arroyo    MR#:  [id number]  DATE OF BIRTH:  11/08/1995  DATE OF ADMISSION:  09/05/2016 ADMITTING PHYSICIAN: Arnaldo NatalMichael S Diamond, MD  DATE OF DISCHARGE: 09/08/2016  PRIMARY CARE PHYSICIAN: Patient, No Pcp Per    ADMISSION DIAGNOSIS:  Seizure (HCC) [R56.9] Seizure-like activity (HCC) [R56.9]  DISCHARGE DIAGNOSIS:  Active Problems:   Bipolar 2 disorder, major depressive episode (HCC)   Seizures (HCC)   Psychosis   Seizure-like activity (HCC)   SECONDARY DIAGNOSIS:   Past Medical History:  Diagnosis Date  . Bipolar 1 disorder [facility](HCC)     HOSPITAL COURSE:   21 year old male with past medical history significant for bipolar disorder and ADHD from a group home comes to hospital secondary to new onset seizures.  #1 new onset seizures - Initially thought to be tonic-clonic seizures,  - appreciate psych consult- concern for K2spice in home made cigarettes that patient has been getting from friends which can cause seizures, bizzare behaviour and sometimes psychosis - improve once it gets out of his system. Appreciate psych consult -urine tox screen is negative other than amphetamines because patient is on Vyvanse. Synthetic marijuana cannot be detected on urine tox -Patient has denied taking any recreational drugs on admission - CT of the head and MRI of the brain did not show any acute findings. -Neurology consult is appreciated.  - EEG done, suggested to stop depakote as symptoms resolved. - We are tapering it quickly in next 2 days.  #2 ADHD- vyvance was held yesterday with seizure.   #3 bipolar disorder-patient on Latuda, luvox and Trileptal  #4 DVT prophylaxis-on teds and SCDs  DISCHARGE CONDITIONS:   Stable.  CONSULTS OBTAINED:  Treatment Team:  Thana Farreynolds, Leslie, MD  DRUG ALLERGIES:  No Known Allergies  DISCHARGE MEDICATIONS:   Current Discharge Medication List    START  taking these medications   Details  divalproex (DEPAKOTE) 500 MG DR tablet Give oral twice daily on 6/18, 09/09/16- then on 09/10/16 give once a day oral and then stop. Qty: 4 tablet, Refills: 0      CONTINUE these medications which have NOT CHANGED   Details  fluvoxaMINE (LUVOX) 100 MG tablet Take 1 tablet (100 mg total) by mouth at bedtime. Qty: 30 tablet, Refills: 0    lisdexamfetamine (VYVANSE) 60 MG capsule Take 60 mg by mouth every morning.    lurasidone (LATUDA) 20 MG TABS tablet Take 20 mg by mouth daily.    Oxcarbazepine (TRILEPTAL) 300 MG tablet Take 1 tablet (300 mg total) by mouth 2 (two) times daily. Qty: 60 tablet, Refills: 0    TRAZODONE HCL PO Take 1 tablet by mouth at bedtime.         DISCHARGE INSTRUCTIONS:    Follow with Neurologist in 1-2 weeks  If you experience worsening of your admission symptoms, develop shortness of breath, life threatening emergency, suicidal or homicidal thoughts you must seek medical attention immediately by calling 911 or calling your MD immediately  if symptoms less severe.  You Must read complete instructions/literature along with all the possible adverse reactions/side effects for all the Medicines you take and that have been prescribed to you. Take any new Medicines after you have completely understood and accept all the possible adverse reactions/side effects.   Please note  You were cared for by a hospitalist during your hospital stay. If you have any questions about your discharge medications or the care you received  while you were in the hospital after you are discharged, you can call the unit and asked to speak with the hospitalist on call if the hospitalist that took care of you is not available. Once you are discharged, your primary care physician will handle any further medical issues. Please note that NO REFILLS for any discharge medications will be authorized once you are discharged, as it is imperative that you return to  your primary care physician (or establish a relationship with a primary care physician if you do not have one) for your aftercare needs so that they can reassess your need for medications and monitor your lab values.    Today   CHIEF COMPLAINT:   Chief Complaint  Patient presents with  . Seizures    HISTORY OF PRESENT ILLNESS:  Joel Arroyo  is a 21 y.o. male with a known history of mood disorder and ADHD presents to the emergency department after seizures. The patient reports that he has not had seizures before. This is questionable as he had been on antiepileptic medication. He reported to nursing staff that he had been hanging out with friends and park when he had 2 seizures. The patient recovered well in the emergency department and was about to be discharged when he had another seizure lasting less than 2 minutes. He was given Ativan and seizure activity resolved. He was loaded with Keppra. CT of his head was normal yet the patient remained postictal which prompted the emergency department staff to call the hospitalist service for admission.   VITAL SIGNS:  Blood pressure (!) 128/57, pulse 86, temperature 97.4 F (36.3 C), resp. rate 18, height 5\' 10"  (1.778 m), weight 71.8 kg (158 lb 4.6 oz), SpO2 98 %.  I/O:   Intake/Output Summary (Last 24 hours) at 09/08/16 1556 Last data filed at 09/08/16 1027  Gross per 24 hour  Intake           3122.5 ml  Output              300 ml  Net           2822.5 ml    PHYSICAL EXAMINATION:  GENERAL:  21 y.o.-year-old patient lying in the bed with no acute distress.  EYES: Pupils equal, round, reactive to light and accommodation. No scleral icterus. Extraocular muscles intact.  HEENT: Head atraumatic, normocephalic. Oropharynx and nasopharynx clear.  NECK:  Supple, no jugular venous distention. No thyroid enlargement, no tenderness.  LUNGS: Normal breath sounds bilaterally, no wheezing, rales,rhonchi or crepitation. No use of accessory  muscles of respiration.  CARDIOVASCULAR: S1, S2 normal. No murmurs, rubs, or gallops.  ABDOMEN: Soft, non-tender, non-distended. Bowel sounds present. No organomegaly or mass.  EXTREMITIES: No pedal edema, cyanosis, or clubbing.  NEUROLOGIC: Cranial nerves II through XII are intact. Muscle strength 5/5 in all extremities. Sensation intact. Gait not checked.  PSYCHIATRIC: The patient is alert and oriented x 3.  SKIN: No obvious rash, lesion, or ulcer.   DATA REVIEW:   CBC  Recent Labs Lab 09/07/16 0509  WBC 7.2  HGB 14.2  HCT 41.4  PLT 260    Chemistries   Recent Labs Lab 09/07/16 0509  NA 136  K 3.9  CL 104  CO2 25  GLUCOSE 110*  BUN 13  CREATININE 1.03  CALCIUM 9.0  MG 2.1    Cardiac Enzymes No results for input(s): TROPONINI in the last 168 hours.  Microbiology Results  Results for orders placed or performed during the hospital  encounter of 09/05/16  MRSA PCR Screening     Status: None   Collection Time: 09/06/16 12:52 PM  Result Value Ref Range Status   MRSA by PCR NEGATIVE NEGATIVE Final    Comment:        The GeneXpert MRSA Assay (FDA approved for NASAL specimens only), is one component of a comprehensive MRSA colonization surveillance program. It is not intended to diagnose MRSA infection nor to guide or monitor treatment for MRSA infections.     RADIOLOGY:  No results found.  EKG:   Orders placed or performed during the hospital encounter of 09/05/16  . EKG 12-Lead  . EKG 12-Lead      Management plans discussed with the patient, family and they are in agreement.  CODE STATUS:     Code Status Orders        Start     Ordered   09/06/16 0652  Full code  Continuous     09/06/16 0652    Code Status History    Date Active Date Inactive Code Status Order ID Comments User Context   09/15/2015  1:57 AM 09/18/2015  5:40 PM Full Code [id number]  Clapacs, Jackquline Denmark, MD Inpatient      TOTAL TIME TAKING CARE OF THIS PATIENT: 35 minutes.     Altamese Dilling M.D on 09/08/2016 at 3:56 PM  Between 7am to 6pm - Pager - [phone]  After 6pm go to [email or web address] - password [email or web address]  [facility]  Office  [phone]  CC: Primary care physician; Patient, No Pcp Per   Note: This dictation was prepared with Dragon dictation along with smaller phrase technology. Any transcriptional errors that result from this process are unintentional.

## 2016-09-09 ENCOUNTER — Encounter: Payer: Self-pay | Admitting: Vascular Surgery

## 2016-09-09 ENCOUNTER — Emergency Department
Admission: EM | Admit: 2016-09-09 | Discharge: 2016-09-10 | Payer: Medicaid Other | Attending: Emergency Medicine | Admitting: Emergency Medicine

## 2016-09-09 DIAGNOSIS — F909 Attention-deficit hyperactivity disorder, unspecified type: Secondary | ICD-10-CM | POA: Diagnosis not present

## 2016-09-09 DIAGNOSIS — Z9114 Patient's other noncompliance with medication regimen: Secondary | ICD-10-CM

## 2016-09-09 DIAGNOSIS — R569 Unspecified convulsions: Secondary | ICD-10-CM | POA: Diagnosis present

## 2016-09-09 DIAGNOSIS — F17200 Nicotine dependence, unspecified, uncomplicated: Secondary | ICD-10-CM | POA: Insufficient documentation

## 2016-09-09 DIAGNOSIS — F319 Bipolar disorder, unspecified: Secondary | ICD-10-CM | POA: Insufficient documentation

## 2016-09-09 DIAGNOSIS — F445 Conversion disorder with seizures or convulsions: Secondary | ICD-10-CM | POA: Diagnosis not present

## 2016-09-09 LAB — COMPREHENSIVE METABOLIC PANEL
ALBUMIN: 4.6 g/dL (ref 3.5–5.0)
ALT: 17 U/L (ref 17–63)
AST: 20 U/L (ref 15–41)
Alkaline Phosphatase: 82 U/L (ref 38–126)
Anion gap: 8 (ref 5–15)
BILIRUBIN TOTAL: 0.6 mg/dL (ref 0.3–1.2)
BUN: 21 mg/dL — AB (ref 6–20)
CO2: 27 mmol/L (ref 22–32)
Calcium: 9.4 mg/dL (ref 8.9–10.3)
Chloride: 101 mmol/L (ref 101–111)
Creatinine, Ser: 1.47 mg/dL — ABNORMAL HIGH (ref 0.61–1.24)
GFR calc Af Amer: >60 mL/min (ref >60)
GFR calc non Af Amer: >60 mL/min (ref >60)
GLUCOSE: 99 mg/dL (ref 65–99)
Potassium: 3.7 mmol/L (ref 3.5–5.1)
Sodium: 136 mmol/L (ref 135–145)
TOTAL PROTEIN: 7.7 g/dL (ref 6.5–8.1)

## 2016-09-09 LAB — CBC WITH DIFFERENTIAL/PLATELET
BASOS ABS: 0 10*3/uL (ref 0–0.1)
BASOS PCT: 0 %
Eosinophils Absolute: 0.2 10*3/uL (ref 0–0.7)
Eosinophils Relative: 3 %
HEMATOCRIT: 42.9 % (ref 40.0–52.0)
HEMOGLOBIN: 14.6 g/dL (ref 13.0–18.0)
Lymphocytes Relative: 28 %
Lymphs Abs: 2.1 10*3/uL (ref 1.0–3.6)
MCH: 32.2 pg (ref 26.0–34.0)
MCHC: 34 g/dL (ref 32.0–36.0)
MCV: 94.7 fL (ref 80.0–100.0)
Monocytes Absolute: 0.9 10*3/uL (ref 0.2–1.0)
Monocytes Relative: 12 %
NEUTROS ABS: 4.3 10*3/uL (ref 1.4–6.5)
NEUTROS PCT: 57 %
Platelets: 238 10*3/uL (ref 150–440)
RBC: 4.53 MIL/uL (ref 4.40–5.90)
RDW: 12.7 % (ref 11.5–14.5)
WBC: 7.6 10*3/uL (ref 3.8–10.6)

## 2016-09-09 LAB — URINE DRUG SCREEN, QUALITATIVE (ARMC ONLY)
Amphetamines, Ur Screen: POSITIVE — AB
Barbiturates, Ur Screen: NOT DETECTED
Benzodiazepine, Ur Scrn: NOT DETECTED
CANNABINOID 50 NG, UR ~~LOC~~: NOT DETECTED
COCAINE METABOLITE, UR ~~LOC~~: NOT DETECTED
MDMA (ECSTASY) UR SCREEN: NOT DETECTED
Methadone Scn, Ur: NOT DETECTED
Opiate, Ur Screen: NOT DETECTED
PHENCYCLIDINE (PCP) UR S: NOT DETECTED
Tricyclic, Ur Screen: NOT DETECTED

## 2016-09-09 LAB — ETHANOL: Alcohol, Ethyl (B): <5 mg/dL (ref <5)

## 2016-09-09 LAB — VALPROIC ACID LEVEL: Valproic Acid Lvl: 23 ug/mL — ABNORMAL LOW (ref 50.0–100.0)

## 2016-09-09 MED ORDER — LORAZEPAM 2 MG/ML IJ SOLN
2.0000 mg | Freq: Once | INTRAMUSCULAR | Status: AC
  Administered 2016-09-09: 2 mg via INTRAVENOUS

## 2016-09-09 MED ORDER — DIVALPROEX SODIUM 500 MG PO DR TAB
500.0000 mg | DELAYED_RELEASE_TABLET | Freq: Once | ORAL | Status: AC
  Administered 2016-09-09: 500 mg via ORAL
  Filled 2016-09-09: qty 1

## 2016-09-09 NOTE — ED Provider Notes (Signed)
[facility] Emergency Department Provider Note       Time seen: ----------------------------------------- 7:43 PM on 09/09/2016 -----------------------------------------     I have reviewed the triage vital signs and the nursing notes.   HISTORY   Chief Complaint Seizures    HPI Joel Arroyo is a 21 y.o. male who presents to the ED for seizure-like activity. Patient is from a group home and he was seen and admitted for seizures on Friday. Patient was discharged on Monday and was discharged Depakote today but he has not yet received it. According to the group home staff he had upper body shaking that did not involve the lower body. Seizure activity lasted approximately 1 minute, he was not incontinent, did not bite his tongue and there is no intraoral trauma. There was no postictal period, he is complaining of vague 5 out of 10 abdominal pain.   Past Medical History:  Diagnosis Date  . Bipolar 1 disorder [facility])     Patient Active Problem List   Diagnosis Date Noted  . Seizure-like activity (HCC)   . Psychosis   . Seizures (HCC) 09/06/2016  . Bipolar 2 disorder, major depressive episode (HCC) 09/15/2015  . Suicidal ideation 09/15/2015  . Homicidal ideation 09/15/2015  . ADHD (attention deficit hyperactivity disorder) 09/14/2015  . Involuntary commitment 09/14/2015    Past Surgical History:  Procedure Laterality Date  . HERNIA REPAIR      Allergies Patient has no known allergies.  Social History Social History  Substance Use Topics  . Smoking status: Current Every Day Smoker  . Smokeless tobacco: Never Used  . Alcohol use No    Review of Systems Constitutional: Negative for fever. Eyes: Negative for vision changes ENT:  Negative for congestion, sore throat Cardiovascular: Negative for chest pain. Respiratory: Negative for shortness of breath. Gastrointestinal:Positive for abdominal pain Genitourinary: Negative for  dysuria. Musculoskeletal: Negative for back pain. Skin: Negative for rash. Neurological: Negative for headaches, focal weakness or numbness. Positive for possible seizure activity  All systems negative/normal/unremarkable except as stated in the HPI  ____________________________________________   PHYSICAL EXAM:  VITAL SIGNS: ED Triage Vitals  Enc Vitals Group     BP 09/09/16 1924 130/89     Pulse Rate 09/09/16 1924 62     Resp 09/09/16 1924 15     Temp 09/09/16 1924 98.9 F (37.2 C)     Temp Source 09/09/16 1924 Oral     SpO2 09/09/16 1924 100 %     Weight --      Height --      Head Circumference --      Peak Flow --      Pain Score 09/09/16 1923 5     Pain Loc --      Pain Edu? --      Excl. in GC? --    Constitutional: Alert and oriented. Well appearing and in no distress. Eyes: Conjunctivae are normal. Normal extraocular movements. ENT   Head: Normocephalic and atraumatic.   Nose: No congestion/rhinnorhea.   Mouth/Throat: Mucous membranes are moist.   Neck: No stridor. Cardiovascular: Normal rate, regular rhythm. No murmurs, rubs, or gallops. Respiratory: Normal respiratory effort without tachypnea nor retractions. Breath sounds are clear and equal bilaterally. No wheezes/rales/rhonchi. Gastrointestinal: Soft and nontender. Normal bowel sounds Musculoskeletal: Nontender with normal range of motion in extremities. No lower extremity tenderness nor edema. Neurologic:  Normal speech and language. No gross focal neurologic deficits are appreciated.  Skin:  Skin is warm,  dry and intact. No rash noted. Psychiatric: Mood and affect are normal. Speech and behavior are normal.  ____________________________________________  EKG: Interpreted by me. Sinus rhythm with a rate of 63 bpm, normal PR interval, normal QS, normal QT.  ____________________________________________  ED COURSE:  Pertinent labs & imaging results that were available during my care of the  patient were reviewed by me and considered in my medical decision making (see chart for details). Patient presents for seizure-like activity, we will assess with labs and imaging as indicated.   Procedures ____________________________________________   LABS (pertinent positives/negatives)  Labs Reviewed  COMPREHENSIVE METABOLIC PANEL - Abnormal; Notable for the following:       Result Value   BUN 21 (*)    Creatinine, Ser 1.47 (*)    All other components within normal limits  CBC WITH DIFFERENTIAL/PLATELET  ETHANOL  URINE DRUG SCREEN, QUALITATIVE ([facility] ONLY)  VALPROIC ACID LEVEL   ____________________________________________  FINAL ASSESSMENT AND PLAN  Seizure-like activity  Plan: Patient's labs and imaging were dictated above. Patient had presented for Seizure-like activity. He did have one episode in the ER that was not witnessed by me. Patient care checked out to Dr. Alphonzo LemmingsMcshane for final disposition.   Emily FilbertWilliams, Miranda Frese E, MD   Note: This note was generated in part or whole with voice recognition software. Voice recognition is usually quite accurate but there are transcription errors that can and very often do occur. I apologize for any typographical errors that were not detected and corrected.     Emily FilbertWilliams, Cris Gibby E, MD 09/09/16 2051

## 2016-09-09 NOTE — ED Triage Notes (Signed)
Pt reports to the ED for eval of seizure activity. He is from a group home he was seen and admitted for seizures on Friday. He was d/c Monday and was to start Depakote but he had not yet received it. Per staff at group home he had upper body, tonic clonic seizure activity that lasted approx 1 minute. No oral trauma or incontinence noted. Pt appears post-ictal he is oriented to person only. 3 lead en route was NSR. CBG PTA was 100 mg/dl.

## 2016-09-09 NOTE — ED Notes (Signed)
After walking by the patient room pt was noted to be having full body tonic-clonic seizure like activity. He began foaming at the mouth and was unresponsive. VSS throughout episode and no injury sustained. Pt received 2 mg of Ativan IV and the seizure activity stopped. EDP made aware.

## 2016-09-10 MED ORDER — LORAZEPAM 2 MG/ML IJ SOLN
INTRAMUSCULAR | Status: AC
  Filled 2016-09-10: qty 1

## 2016-09-10 NOTE — ED Notes (Signed)
This RN called [phone], Liana GeroldStacey Pearman, who is listed as patient's guardian. Voicemail left to notify legal guardian is being discharged. In recording it states to call Museum/gallery conservatorearman supervisor, Toya Smothersracy Saunders. Consulting civil engineerCharge RN notified.

## 2016-09-10 NOTE — ED Notes (Signed)
[name] called back and stated that he would attempt to contact the group home to come pick pt up.

## 2016-09-10 NOTE — ED Provider Notes (Signed)
-----------------------------------------   12:09 AM on 09/10/2016 -----------------------------------------  Pt was sleeping in nad. Signed out to dr. Scotty CourtStafford.   Jeanmarie PlantMcShane, James A, MD 09/10/16 [phone]

## 2016-09-10 NOTE — ED Notes (Signed)
Pt able to ambulate to and from restroom with no problems. Given lunch tray.

## 2016-09-10 NOTE — ED Notes (Signed)
This RN called Joel Arroyo at [phone]. Recording states she will be out of the office until Monday, June 25 th.

## 2016-09-10 NOTE — Discharge Instructions (Addendum)
Your depakote level was low today. Ensure you are taking this medication every day and not missing any doses. Follow up with your doctor for continued monitoring of your symptoms.

## 2016-09-10 NOTE — ED Notes (Addendum)
Joel Arroyo, [phone]) [phone], called by this RN to notify patient is being discharge and determine how patient would be discharged. Voicemail left to this number. In ClarksonSaunders recording another number was provided to [facility] supervisor, [employer] CorporationSheryl Millmore, [phone]. Consulting civil engineerCharge RN notified.

## 2016-09-10 NOTE — ED Notes (Signed)
Group home staff called. Staff stated she had a family emergency and did not have her phone and this is why she was unable to answer. Staff stated she would be here within one hour to pick pt up.

## 2016-09-10 NOTE — ED Notes (Signed)
Spoke with DSS hotline, Onalee HuaDavid at [phone]. Reports will get in touch with on-call DSS and have them call this RN back.

## 2016-09-10 NOTE — ED Notes (Signed)
DSS Rosalio MacadamiaBrandy Thomas notified this RN of patient belonging to group [facility] [address] [facility], Wynelle BourgeoisLatoya Murphy [phone] is group home owner.

## 2016-09-10 NOTE — ED Notes (Signed)
Pt presenting with bouncing like movements on stretcher, arching back repeatedly. EDP at bedside. Pt stopped when asked to. Pt answers questions appropriately after movements stop.

## 2016-09-10 NOTE — ED Notes (Signed)
Received call from on-call DSS, Rosalio MacadamiaBrandy Thomas. Provided this RN with group home number [phone] and group home owner cell phone, Modena JanskyJane Forester, [phone]. Attempted to call group home at [phone] and group home number on pt file [phone], states number is not in service. Called Modena JanskyJane Forester cell phone and message left to [facility]. Consulting civil engineerCharge RN notified. MD notified. Rosalio MacadamiaBrandy Thomas DSS spoke with this RN and this RN informed DSS group home number was not in service and voicemail was left with Modena JanskyJane Forester. Maisie Fushomas reports will attempt to find more information on patient and will call this RN back.

## 2016-09-10 NOTE — ED Notes (Signed)
Patient resting comfortably in stretcher, no complaints at this time. Will continue to monitor.

## 2016-09-10 NOTE — ED Provider Notes (Addendum)
 -----------------------------------------   1:31 AM on 09/10/2016 -----------------------------------------  Patient awake alert oriented. Vital signs normal. Normal mental status, denies any symptoms at present time. Medically stable. Psychiatrically stable. Suitable for outpatient follow-up and discharged home to group home in improved condition.   Sharman CheekStafford, Charmaine Placido, MD 09/10/16 0131   ----------------------------------------- 2:04 AM on 09/10/2016 -----------------------------------------  When patient was informed he was going to be discharged, he had another seizure-like episode similar to all of his previous with twitching and arching his back. He quickly returned to his normal baseline without being given any benzodiazepines. 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 It does appear that neurology and recommended discontinuing his Vyvanse in the meantime, but this is still listed on his active medication list. The continued amphetamine exposure may be prolonging his symptoms. We'll continue to monitor the patient in the ED a bit longer but anticipate he'll be suitable for discharge home.    ----------------------------------------- 3:41 AM on 09/10/2016 -----------------------------------------  Patient observed in the ED in [place] of nursing staff. He did have one recurrent episode of similar shaking and twitching, but was awake and alert and when instructed to stop, he was able to comply immediately. No tongue biting or urinary incontinence or postictal phase during any of these episodes in the ED today. Very much consistent with nonepileptic seizure. Recommend discontinuing the Vyvanse as per neurology recommendations in the hospital. Advised to take his Depakote as prescribed and not miss doses. Follow up with primary care and neurology.    Sharman CheekStafford, Lillie Portner, MD 09/10/16 [phone]    ----------------------------------------- 3:45 AM on 09/10/2016 -----------------------------------------  Unable to discharge the patient at the present time because were unable to get a callback from any of his guardians or group home. We'll continue attempting until we can reach somebody so that we  fill so can discharge.   Sharman CheekStafford, Kinberly Perris, MD 09/10/16 [phone]

## 2016-09-10 NOTE — ED Notes (Signed)
Patient noted to have bouncing movement off of stretcher and spitting. Patient's muscles are relaxed during the event and he recovers quickly; asked patient to stop the activity and he did. Primary RN notified of activity as well as MD.

## 2016-09-10 NOTE — ED Notes (Signed)
[place] [employer] asked if they could ask [place] [employer] to go to patient residency at [address] [place]. Per [place] [employer] there was no answer at residency. [place] states will attempt again this morning. MD notified.

## 2016-09-10 NOTE — ED Notes (Signed)
Left message with [place] at [place] [facility] at [phone] that group home staff has not arrived to pick him up. This writer attempted to call group home staff at [phone] the number listed for the group home and no one answered.

## 2016-09-10 NOTE — ED Notes (Signed)
Owner of group home here to pick up patient. MD explained to caregiver about depakote prescription given on previous hospital discharge. Caregiver expressed understanding. No further questions at this time.

## 2016-09-10 NOTE — ED Notes (Signed)
Charge RN notified this RN of patient having seizure-like activity, states pt was foaming at the mouth and shaking. MD notified of patient situation. MD at bedside, no new orders provided to this RN. Upon this RN entry to room pt was laying on right side, suction on, patient asking "am I home?" Informed patient he was at the hospital. Pt moved from treatment room 18 to 19 hall to be closely monitored. Cardiac monitor placed on patient by Demetrio LappingKala, RN.

## 2016-09-10 NOTE — ED Notes (Addendum)
Call back number received from [employer] [phone]. Attempted x 2 no answer and no voicemail machine available. [place] [employer] notifying [place] [employer] to go to group home, [facility] at [employer], [place], to have group home staff to call hospital.

## 2016-09-10 NOTE — ED Notes (Signed)
Attempted to call all numbers listed in chart. Group home number not reachable at this time. DSS workers phone numbers go to voicemail and they haven't returned any calls at this time. Phone numbers [phone] and [phone] ARE NOT RELATED TO THIS PATIENT.

## 2016-09-10 NOTE — ED Notes (Signed)
This RN called Wynelle BourgeoisLatoya Murphy at [phone]. Voicemail recording states this number belongs to [employer] [facility]man. Consulting civil engineerCharge RN and MD notified.

## 2016-09-14 ENCOUNTER — Emergency Department
Admission: EM | Admit: 2016-09-14 | Discharge: 2016-09-14 | Disposition: A | Discharge status: Home / Self Care | Payer: Medicaid Other | Attending: Emergency Medicine | Admitting: Emergency Medicine

## 2016-09-14 ENCOUNTER — Encounter: Payer: Self-pay | Admitting: Emergency Medicine

## 2016-09-14 DIAGNOSIS — F319 Bipolar disorder, unspecified: Secondary | ICD-10-CM | POA: Insufficient documentation

## 2016-09-14 DIAGNOSIS — R569 Unspecified convulsions: Secondary | ICD-10-CM | POA: Diagnosis not present

## 2016-09-14 DIAGNOSIS — F17200 Nicotine dependence, unspecified, uncomplicated: Secondary | ICD-10-CM | POA: Insufficient documentation

## 2016-09-14 DIAGNOSIS — Z79899 Other long term (current) drug therapy: Secondary | ICD-10-CM | POA: Insufficient documentation

## 2016-09-14 HISTORY — DX: Unspecified convulsions: R56.9

## 2016-09-14 LAB — BASIC METABOLIC PANEL
ANION GAP: 8 (ref 5–15)
BUN: 20 mg/dL (ref 6–20)
CALCIUM: 9.5 mg/dL (ref 8.9–10.3)
CHLORIDE: 104 mmol/L (ref 101–111)
CO2: 27 mmol/L (ref 22–32)
CREATININE: 0.88 mg/dL (ref 0.61–1.24)
GFR calc non Af Amer: >60 mL/min (ref >60)
Glucose, Bld: 105 mg/dL — ABNORMAL HIGH (ref 65–99)
Potassium: 4.2 mmol/L (ref 3.5–5.1)
SODIUM: 139 mmol/L (ref 135–145)

## 2016-09-14 LAB — CARBAMAZEPINE LEVEL, TOTAL: Carbamazepine Lvl: <2 ug/mL — ABNORMAL LOW (ref 4.0–12.0)

## 2016-09-14 LAB — PROCALCITONIN: Procalcitonin: <0.1 ng/mL

## 2016-09-14 LAB — LACTIC ACID, PLASMA: LACTIC ACID, VENOUS: 1.1 mmol/L (ref 0.5–1.9)

## 2016-09-14 LAB — GLUCOSE, CAPILLARY: GLUCOSE-CAPILLARY: 102 mg/dL — AB (ref 65–99)

## 2016-09-14 LAB — VALPROIC ACID LEVEL

## 2016-09-14 MED ORDER — LORAZEPAM 2 MG/ML IJ SOLN
2.0000 mg | Freq: Once | INTRAMUSCULAR | Status: AC
  Administered 2016-09-14: 2 mg via INTRAVENOUS

## 2016-09-14 MED ORDER — DIAZEPAM 10 MG RE GEL: 10.0000 mg | Freq: Once | RECTAL | 0 refills | Status: DC

## 2016-09-14 NOTE — ED Provider Notes (Addendum)
[facility] Emergency Department Provider Note  ____________________________________________   I have reviewed the triage vital signs and the nursing notes.   HISTORY  Chief Complaint Seizures    HPI Joel Arroyo is a 21 y.o. male who presents today complaining of seizures. Patient has had what are considerably likely non-epileptiform seizures over the last couple weeks. He denies prior seizure history although apparently he was on some anticonvulsants in the past. Patient was recently admitted and evaluated with a normal EEG normal MRI and normal workup. He came back with repeat seizures which were thought to be pseudoseizures. Patient states that he is aware during he states he does not usually have a postictal phase. Interestingly, patient did have a recent death in his family, his 20 year old sister was found at the bottom of the stairs of unknown causes and this is caused a great of stress and anxiety.The patient has had 2 episodes today that were seizure-like he states he remembers faintly what was going on during them and is described as generalized jerking. The patient was actually doing this when he came in but then immediately broke after getting Ativan and did not seem to be postictal. Did not bite his tongue. No loss of bladder. He denies headache fever stiff neck focal neurologic deficit recent infection IV drug abuse or visible signs.     Past Medical History:  Diagnosis Date  . Bipolar 1 disorder (HCC)   . Seizures [facility](HCC)     Patient Active Problem List   Diagnosis Date Noted  . Seizure-like activity (HCC)   . Psychosis   . Seizures (HCC) 09/06/2016  . Bipolar 2 disorder, major depressive episode (HCC) 09/15/2015  . Suicidal ideation 09/15/2015  . Homicidal ideation 09/15/2015  . ADHD (attention deficit hyperactivity disorder) 09/14/2015  . Involuntary commitment 09/14/2015    Past Surgical History:  Procedure Laterality Date   . HERNIA REPAIR      Prior to Admission medications   Medication Sig Start Date End Date Taking? Authorizing Provider  fluvoxaMINE (LUVOX) 100 MG tablet Take 1 tablet (100 mg total) by mouth at bedtime. 09/18/15   Pucilowska, Jolanta B, MD  lurasidone (LATUDA) 20 MG TABS tablet Take 20 mg by mouth daily.    [provider]  Oxcarbazepine (TRILEPTAL) 300 MG tablet Take 1 tablet (300 mg total) by mouth 2 (two) times daily. 09/18/15   Pucilowska, Braulio ConteJolanta B, MD  TRAZODONE HCL PO Take 1 tablet by mouth at bedtime.    [provider]    Allergies Patient has no known allergies.  No family history on file.  Social History Social History  Substance Use Topics  . Smoking status: Current Every Day Smoker  . Smokeless tobacco: Never Used  . Alcohol use No    Review of Systems Constitutional: No fever/chills Eyes: No visual changes. ENT: No sore throat. No stiff neck no neck pain Cardiovascular: Denies chest pain. Respiratory: Denies shortness of breath. Gastrointestinal:   no vomiting.  No diarrhea.  No constipation. Genitourinary: Negative for dysuria. Musculoskeletal: Negative lower extremity swelling Skin: Negative for rash. Neurological: Negative for severe headaches, focal weakness or numbness.   ____________________________________________   PHYSICAL EXAM:  VITAL SIGNS: ED Triage Vitals  Enc Vitals Group     BP 09/14/16 1122 124/82     Pulse Rate 09/14/16 1122 77     Resp 09/14/16 1122 16     Temp 09/14/16 1122 98 F (36.7 C)     Temp Source  09/14/16 1122 Oral     SpO2 09/14/16 1122 98 %     Weight --      Height --      Head Circumference --      Peak Flow --      Pain Score 09/14/16 1113 10     Pain Loc --      Pain Edu? --      Excl. in GC? --     Constitutional: Alert and oriented. Well appearing and in no acute distress. Eyes: Conjunctivae are normal Head: Atraumatic HEENT: No congestion/rhinnorhea. Mucous membranes are moist.   Oropharynx non-erythematous Neck:   Nontender with no meningismus, no masses, no stridor Cardiovascular: Normal rate, regular rhythm. Grossly normal heart sounds.  Good peripheral circulation. Respiratory: Normal respiratory effort.  No retractions. Lungs CTAB. Abdominal: Soft and nontender. No distention. No guarding no rebound Back:  There is no focal tenderness or step off.  there is no midline tenderness there are no lesions noted. there is no CVA tenderness Musculoskeletal: No lower extremity tenderness, no upper extremity tenderness. No joint effusions, no DVT signs strong distal pulses no edema Neurologic:  Normal speech and language. No gross focal neurologic deficits are appreciated.  Skin:  Skin is warm, dry and intact. No rash noted. Psychiatric: Mood and affect are normal. Speech and behavior are normal.  ____________________________________________   LABS (all labs ordered are listed, but only abnormal results are displayed)  Labs Reviewed  BASIC METABOLIC PANEL - Abnormal; Notable for the following:       Result Value   Glucose, Bld 105 (*)    All other components within normal limits  VALPROIC ACID LEVEL - Abnormal; Notable for the following:    Valproic Acid Lvl <10 (*)    All other components within normal limits  GLUCOSE, CAPILLARY - Abnormal; Notable for the following:    Glucose-Capillary 102 (*)    All other components within normal limits  CARBAMAZEPINE LEVEL, TOTAL - Abnormal; Notable for the following:    Carbamazepine Lvl <2.0 (*)    All other components within normal limits  PROCALCITONIN  LACTIC ACID, PLASMA  LACTIC ACID, PLASMA  URINE DRUG SCREEN, QUALITATIVE ([facility] ONLY)  CBG MONITORING, ED   ____________________________________________  EKG  I personally interpreted any EKGs ordered by me or triage Sinus rhythm at 71 bpm no acute ST elevation or depression, repolarization abnormality noted. Feel this likely does not represent  pericarditis. ____________________________________________  RADIOLOGY  I reviewed any imaging ordered by me or triage that were performed during my shift and, if possible, patient and/or family made aware of any abnormal findings. ____________________________________________   PROCEDURES  Procedure(s) performed: None  Procedures  Critical Care performed: None  ____________________________________________   INITIAL IMPRESSION / ASSESSMENT AND PLAN / ED COURSE  Pertinent labs & imaging results that were available during my care of the patient were reviewed by me and considered in my medical decision making (see chart for details).  SHEENT here with seizure-like activity, no evidence of significant infection, initially told me he was taking valproic acid level is negative, then using carbamazepine level is negative. We will discuss with neurology, unclear if this patient is having pseudoseizure not. He did as noted above have one seizure-like activity which was very brief when he arrived but it did break with Ativan, he did not have a postictal phase and he did not have any focality to his exam. He does not bias on, always happens after significant left trauma  patient with a great deal of anxiety, there is a strong suspicion with a negative EEG and negative MRI but this is actually pseudoseizure but I feel a further discussion with neurology might not be a bad idea. Patient does have close outpatient follow-up with neurology in the next day or 2. Signed out to dr. Scotty Court at the end of my shift.     ____________________________________________   FINAL CLINICAL IMPRESSION(S) / ED DIAGNOSES  Final diagnoses:  None      This chart was dictated using voice recognition software.  Despite best efforts to proofread,  errors can occur which can change meaning.      Jeanmarie Plant, MD 09/14/16 1542    Jeanmarie Plant, MD 09/14/16 [phone]

## 2016-09-14 NOTE — ED Notes (Addendum)
Spoke with facility Artistcoordinator and program coordinator from [facility]. Spoke with Glee ArvinLatoya and Erskine SquibbJane regarding legal guardian and d/c pick up. Pt to wait in facility for them to pick up around 10pm  [phone] (pick up) [phone]- Glee ArvinLatoya

## 2016-09-14 NOTE — Discharge Instructions (Signed)
Your test today are unremarkable. It is important that you take your medications as prescribed and follow-up with neurology at your upcoming appointment for continued evaluation. If you are having another seizure at home that lasts more than a minute, you should use the Diastat medication which can help stop the seizure.  You will still need to call 911 and come to the hospital for evaluation.

## 2016-09-14 NOTE — ED Triage Notes (Signed)
[facility] [place]. Per EMS when they arrived pt was in post-ictal state. Witness at scene reported that pt had a grand-mal seizure that lasted approximately 4 minutes. Pt appears to be post-ictal currently, alert to self only currently. No oral trauma noted

## 2016-09-14 NOTE — ED Notes (Signed)
Pt church family in visiting with patient at this time

## 2016-09-14 NOTE — ED Notes (Signed)
Pt resting quietly waiting for transport back to [facility]; given sandwich tray and lemon lime soda to drink; pt spoke with someone at group home and they verified someone will be here around 10pm

## 2016-09-14 NOTE — ED Provider Notes (Addendum)
Assumed care from Dr. Lucky RathkeMcCharen waiting for neurology coverage to be able to discuss with neurology. I did discuss the case with Dr. Loretha BrasilZeylikman  who advises that the only other potential workup would be a continuous EEG monitoring which could potentially be done at a tertiary care center. However, because of the negative antiepileptic levels, patient should first take his medications as prescribed is still having uncontrolled events continuous EEG may become warranted. Patient has follow-up in the next one or 2 days with neurology. I counseled him on taking all of his medication as prescribed and keeping this appointment for further evaluation. Diastat prescription given.   Sharman CheekStafford, Zamaya Rapaport, MD 09/14/16 1839   ----------------------------------------- 8:45 PM on 09/14/2016 -----------------------------------------  While waiting for discharge, patient was kept in a hallway bed. He did have one episode of generalized convulsive activity, during which the patient was noted to be watching staff to see if you would pay attention to him. When we did not immediately return to his bedside as a test, his seizure activity stopped within a few seconds and he return to his normal baseline. Consistent with non-epileptogenic seizures   Sharman CheekStafford, Alanta Scobey, MD 09/14/16 2047

## 2016-09-14 NOTE — ED Notes (Signed)
Pt sleeping, respirations equal and unlabored.

## 2016-09-25 ENCOUNTER — Encounter: Payer: Self-pay | Admitting: Emergency Medicine

## 2016-09-25 ENCOUNTER — Emergency Department
Admission: EM | Admit: 2016-09-25 | Discharge: 2016-09-26 | Disposition: A | Discharge status: Home / Self Care | Payer: Medicaid Other | Attending: Emergency Medicine | Admitting: Emergency Medicine

## 2016-09-25 DIAGNOSIS — Z79899 Other long term (current) drug therapy: Secondary | ICD-10-CM | POA: Insufficient documentation

## 2016-09-25 DIAGNOSIS — F17200 Nicotine dependence, unspecified, uncomplicated: Secondary | ICD-10-CM | POA: Insufficient documentation

## 2016-09-25 DIAGNOSIS — R569 Unspecified convulsions: Secondary | ICD-10-CM | POA: Diagnosis present

## 2016-09-25 DIAGNOSIS — F445 Conversion disorder with seizures or convulsions: Secondary | ICD-10-CM | POA: Diagnosis not present

## 2016-09-25 DIAGNOSIS — F319 Bipolar disorder, unspecified: Secondary | ICD-10-CM | POA: Insufficient documentation

## 2016-09-25 NOTE — ED Triage Notes (Signed)
Patient arrived via EMS with complaint of seizures/convulsions x5 today.  Patient is alert and oriented and comes from [facility] group home.  Patient VS WNL and his only complaint is a slight headache.  Patient cooperative and pleasant during triage.

## 2016-09-26 LAB — COMPREHENSIVE METABOLIC PANEL
ALT: 26 U/L (ref 17–63)
ANION GAP: 9 (ref 5–15)
AST: 28 U/L (ref 15–41)
Albumin: 4.5 g/dL (ref 3.5–5.0)
Alkaline Phosphatase: 61 U/L (ref 38–126)
BUN: 21 mg/dL — ABNORMAL HIGH (ref 6–20)
CALCIUM: 9.3 mg/dL (ref 8.9–10.3)
CHLORIDE: 103 mmol/L (ref 101–111)
CO2: 25 mmol/L (ref 22–32)
Creatinine, Ser: 0.88 mg/dL (ref 0.61–1.24)
GFR calc non Af Amer: >60 mL/min (ref >60)
Glucose, Bld: 105 mg/dL — ABNORMAL HIGH (ref 65–99)
Potassium: 3.3 mmol/L — ABNORMAL LOW (ref 3.5–5.1)
SODIUM: 137 mmol/L (ref 135–145)
Total Bilirubin: 0.7 mg/dL (ref 0.3–1.2)
Total Protein: 7.6 g/dL (ref 6.5–8.1)

## 2016-09-26 LAB — BASIC METABOLIC PANEL
ANION GAP: 8 (ref 5–15)
BUN: 26 mg/dL — ABNORMAL HIGH (ref 6–20)
CO2: 27 mmol/L (ref 22–32)
Calcium: 9.3 mg/dL (ref 8.9–10.3)
Chloride: 103 mmol/L (ref 101–111)
Creatinine, Ser: 1.14 mg/dL (ref 0.61–1.24)
GLUCOSE: 107 mg/dL — AB (ref 65–99)
POTASSIUM: 3.7 mmol/L (ref 3.5–5.1)
Sodium: 138 mmol/L (ref 135–145)

## 2016-09-26 LAB — URINE DRUG SCREEN, QUALITATIVE (ARMC ONLY)
AMPHETAMINES, UR SCREEN: POSITIVE — AB
BENZODIAZEPINE, UR SCRN: NOT DETECTED
Barbiturates, Ur Screen: NOT DETECTED
COCAINE METABOLITE, UR ~~LOC~~: NOT DETECTED
Cannabinoid 50 Ng, Ur ~~LOC~~: NOT DETECTED
MDMA (Ecstasy)Ur Screen: NOT DETECTED
METHADONE SCREEN, URINE: NOT DETECTED
OPIATE, UR SCREEN: NOT DETECTED
Phencyclidine (PCP) Ur S: NOT DETECTED
Tricyclic, Ur Screen: NOT DETECTED

## 2016-09-26 LAB — CBC
HCT: 42.4 % (ref 40.0–52.0)
HEMATOCRIT: 41.7 % (ref 40.0–52.0)
HEMOGLOBIN: 14.2 g/dL (ref 13.0–18.0)
Hemoglobin: 14.9 g/dL (ref 13.0–18.0)
MCH: 32.2 pg (ref 26.0–34.0)
MCH: 32.5 pg (ref 26.0–34.0)
MCHC: 34 g/dL (ref 32.0–36.0)
MCHC: 35 g/dL (ref 32.0–36.0)
MCV: 92.9 fL (ref 80.0–100.0)
MCV: 94.7 fL (ref 80.0–100.0)
PLATELETS: 273 10*3/uL (ref 150–440)
Platelets: 262 10*3/uL (ref 150–440)
RBC: 4.57 MIL/uL (ref 4.40–5.90)
RBC: 4.4 MIL/uL (ref 4.40–5.90)
RDW: 13.2 % (ref 11.5–14.5)
RDW: 13.3 % (ref 11.5–14.5)
WBC: 9.4 10*3/uL (ref 3.8–10.6)
WBC: 8.1 10*3/uL (ref 3.8–10.6)

## 2016-09-26 NOTE — ED Provider Notes (Signed)
[facility] Emergency Department Provider Note    First MD Initiated Contact with Patient 09/25/16 2311     (approximate)  I have reviewed the triage vital signs and the nursing notes.   HISTORY  Chief Complaint Seizures   HPI Joel Arroyo is a 21 y.o. male with below list of medical conditions present with "6 or 9 convulsive episodes. Patient has been seen multiple times for non-epileptiform convulsions with a negative MRI and EEG performed. Patient states that he does not recall any of the incidents today patient hasn't no complaints at present   Past Medical History:  Diagnosis Date  . Bipolar 1 disorder (HCC)   . Seizures [facility](HCC)     Patient Active Problem List   Diagnosis Date Noted  . Seizure-like activity (HCC)   . Psychosis   . Seizures (HCC) 09/06/2016  . Bipolar 2 disorder, major depressive episode (HCC) 09/15/2015  . Suicidal ideation 09/15/2015  . Homicidal ideation 09/15/2015  . ADHD (attention deficit hyperactivity disorder) 09/14/2015  . Involuntary commitment 09/14/2015    Past Surgical History:  Procedure Laterality Date  . HERNIA REPAIR      Prior to Admission medications   Medication Sig Start Date End Date Taking? Authorizing Provider  diazepam (DIASTAT ACUDIAL) 10 MG GEL Place 10 mg rectally once. 09/14/16 09/14/16  Sharman CheekStafford, Phillip, MD  fluvoxaMINE (LUVOX) 100 MG tablet Take 1 tablet (100 mg total) by mouth at bedtime. 09/18/15   Pucilowska, Jolanta B, MD  lurasidone (LATUDA) 20 MG TABS tablet Take 20 mg by mouth daily.    [provider]  Oxcarbazepine (TRILEPTAL) 300 MG tablet Take 1 tablet (300 mg total) by mouth 2 (two) times daily. 09/18/15   Pucilowska, Braulio ConteJolanta B, MD  TRAZODONE HCL PO Take 1 tablet by mouth at bedtime.    [provider]    Allergies Patient has no known allergies.  History reviewed. No pertinent family history.  Social History Social History  Substance Use Topics  .  Smoking status: Current Every Day Smoker  . Smokeless tobacco: Never Used  . Alcohol use No    Review of Systems Constitutional: No fever/chills Eyes: No visual changes. ENT: No sore throat. Cardiovascular: Denies chest pain. Respiratory: Denies shortness of breath. Gastrointestinal: No abdominal pain.  No nausea, no vomiting.  No diarrhea.  No constipation. Genitourinary: Negative for dysuria. Musculoskeletal: Negative for neck pain.  Negative for back pain. Integumentary: Negative for rash. Neurological: Negative for headaches, focal weakness or numbness. Positive for convulsions  ____________________________________________   PHYSICAL EXAM:  VITAL SIGNS: ED Triage Vitals  Enc Vitals Group     BP 09/25/16 2245 125/79     Pulse Rate 09/25/16 2245 97     Resp 09/25/16 2245 16     Temp 09/25/16 2245 98.1 F (36.7 C)     Temp Source 09/25/16 2245 Oral     SpO2 09/25/16 2245 98 %     Weight 09/25/16 2246 73.5 kg (162 lb)     Height 09/25/16 2246 1.778 m (5\' 10" )     Head Circumference --      Peak Flow --      Pain Score 09/25/16 2244 9     Pain Loc --      Pain Edu? --      Excl. in GC? --     Constitutional: Alert and oriented. Well appearing and in no acute distress. Eyes: Conjunctivae are normal. PERRL. EOMI. Head: Atraumatic. Ears:  Healthy  appearing ear canals and TMs bilaterally Nose: No congestion/rhinnorhea. Mouth/Throat: Mucous membranes are moist. Oropharynx non-erythematous. Neck: No stridor.   Cardiovascular: Normal rate, regular rhythm. Good peripheral circulation. Grossly normal heart sounds. Respiratory: Normal respiratory effort.  No retractions. Lungs CTAB. Gastrointestinal: Soft and nontender. No distention.  Musculoskeletal: No lower extremity tenderness nor edema. No gross deformities of extremities. Neurologic:  Normal speech and language. No gross focal neurologic deficits are appreciated.  Skin:  Skin is warm, dry and intact. No rash  noted. Psychiatric: Mood and affect are normal. Speech and behavior are normal.  ____________________________________________   LABS (all labs ordered are listed, but only abnormal results are displayed)  Labs Reviewed  CBC  COMPREHENSIVE METABOLIC PANEL  URINE DRUG SCREEN, QUALITATIVE ([facility] ONLY)     Procedures   ____________________________________________   INITIAL IMPRESSION / ASSESSMENT AND PLAN / ED COURSE  Pertinent labs & imaging results that were available during my care of the patient were reviewed by me and considered in my medical decision making (see chart for details).  Patient observed in the emergency department for 4 hours with no witnessed convulsions or seizure-like activity.    patient's legal guardian notified by the  nurse. Patient will be discharged in the care of his group home  ____________________________________________  FINAL CLINICAL IMPRESSION(S) / ED DIAGNOSES  Final diagnoses:  Pseudoseizure     MEDICATIONS GIVEN DURING THIS VISIT:  Medications - No data to display   NEW OUTPATIENT MEDICATIONS STARTED DURING THIS VISIT:  New Prescriptions   No medications on file    Modified Medications   No medications on file    Discontinued Medications   No medications on file     Note:  This document was prepared using Dragon voice recognition software and may include unintentional dictation errors.    Darci Current, MD 09/26/16 [phone]

## 2016-09-26 NOTE — ED Notes (Signed)
Spoke with Okey Regalarol with [place] [employer]. Notified patient is here, what is going on and that he is being discharged.

## 2016-09-26 NOTE — ED Notes (Signed)
[name] from [facility] called and notified that pt was up for discharge. Per [name] she is on her way.

## 2016-09-26 NOTE — ED Notes (Addendum)
Patient states his legal guardian is August SaucerDee Carlos. Unsure of his number because his cell phone is dead. Did call the [phone], after hours emergency number for [place] [facility]. [facility] who is paging the on call person.

## 2016-09-26 NOTE — ED Notes (Signed)
Esign not working, pt verbalized discharge instructions and has no questions at this time 

## 2016-09-26 NOTE — ED Triage Notes (Addendum)
Pt arrives via [facility] [place] [facility]s. Pt states is from [facility]. Per andrea first nurse, ems stated pt with seizure like activity. Pt was recently seen at [place] for seizures and not started on any medication. Pt without oral trauma. When asked if pt has pain he states "no" then complains of headache. Pt did not lose control of bowel or bladder. Yellow fall risk band placed on pt in triage.

## 2016-09-26 NOTE — ED Notes (Signed)
Patient has not had seizure activity while in the ED

## 2016-09-27 ENCOUNTER — Emergency Department
Admission: EM | Admit: 2016-09-27 | Discharge: 2016-09-27 | Disposition: A | Discharge status: Home / Self Care | Payer: Medicaid Other | Source: Home / Self Care | Attending: Emergency Medicine | Admitting: Emergency Medicine

## 2016-09-27 ENCOUNTER — Encounter: Payer: Self-pay | Admitting: Emergency Medicine

## 2016-09-27 DIAGNOSIS — R569 Unspecified convulsions: Secondary | ICD-10-CM

## 2016-09-27 DIAGNOSIS — F445 Conversion disorder with seizures or convulsions: Secondary | ICD-10-CM

## 2016-09-27 NOTE — ED Notes (Signed)
Pt assisted to commode in wheelchair.

## 2016-09-27 NOTE — Discharge Instructions (Signed)
Your workup in the Emergency Department today was reassuring.  We did not find any specific abnormalities.  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  We recommend you drink plenty of fluids, take your regular medications and/or any new ones prescribed today, and follow up with the doctor(s) listed in these documents as recommended.  Avoid drugs and alcohol.  Follow up with your regular doctor and mental health care provider.  Return to the Emergency Department if you develop new or worsening symptoms that concern you.

## 2016-09-27 NOTE — ED Notes (Addendum)
Patient's legal guardian not contacted.  The guardianship papers are not for health care decision only for living arrangements and financial decisions.

## 2016-09-27 NOTE — ED Notes (Signed)
ED provider in with patient. 

## 2016-09-27 NOTE — ED Notes (Signed)
Pt caretaker from [facility] called to check on pt status. Informed that pt has not been seen by provider at this time.

## 2016-09-27 NOTE — ED Provider Notes (Signed)
[facility] Emergency Department Provider Note  ____________________________________________   First MD Initiated Contact with Patient 09/27/16 0148     (approximate)  I have reviewed the triage vital signs and the nursing notes.   HISTORY  Chief Complaint Seizures  History May be limited by the patient's chronic and well-documented mental illness.  He has a legal guardian of the state but his father is currently present in the emergency department.  HPI Joel Arroyo is a 21 y.o. male with a history of bipolar disorder who lives at a group home and is the legal guardian of the state who presents for evaluation of seizure-like activity.  His father was called and came to the emergency department with finding out that there was no representative from the group home nor his state legal representative present with the patient.  The patient has had multiple recent visits for seizure-like activity.  He was admitted to the hospital and had a full neurological workup including MRI and EEG, both of which were normal.  He had originally been started on Depakote but was weaned off of it and he was evaluated by psychiatry.  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  However previous notes from emergency physicians documented that the patient was having seizure-like activity but was clearly watching to see if anyone was observing him and when no one would respond he would stop having the activity.  Today at the group home he reportedly had at least one if not several episodes of seizure-like activity.  He has never postictal and has no evidence of physical trauma although he is complaining that his head hurts, mild dull aching pain in the back of his head.  His father does not have anymore details and no other details were provided by EMS upon arrival except that he has  also been seen recently at [facility].  He is not taking and the antiseizure medicine.  The patient reports that sometimes he feels the episodes coming on at other times he does not.  He denies doing it voluntarily but his father mentioned that it is very difficult to know when the patient is lying given his mental illness.  The patient denies suicidality and denies any intention to hurt anyone.  He denies fever/chills, chest pain, shortness of breath, nausea, vomiting, abdominal pain.   Past Medical History:  Diagnosis Date  . Bipolar 1 disorder (HCC)   . Seizures [facility])     Patient Active Problem List   Diagnosis Date Noted  . Seizure-like activity (HCC)   . Psychosis   . Seizures (HCC) 09/06/2016  . Bipolar 2 disorder, major depressive episode (HCC) 09/15/2015  . Suicidal ideation 09/15/2015  . Homicidal ideation 09/15/2015  . ADHD (attention deficit hyperactivity disorder) 09/14/2015  . Involuntary commitment 09/14/2015    Past Surgical History:  Procedure Laterality Date  . HERNIA REPAIR      Prior to Admission medications   Medication Sig Start Date End Date Taking? Authorizing Provider  lisdexamfetamine (VYVANSE) 50 MG capsule Take 50 mg by mouth daily.   Yes [provider]  lurasidone (LATUDA) 20 MG TABS tablet Take 20 mg by mouth daily.   Yes [provider]  Oxcarbazepine (TRILEPTAL) 300 MG tablet Take 1 tablet (300 mg total) by mouth 2 (two) times daily. 09/18/15  Yes Pucilowska, Jolanta B, MD  TRAZODONE HCL PO Take 1 tablet  by mouth at bedtime.   Yes [provider]  diazepam (DIASTAT ACUDIAL) 10 MG GEL Place 10 mg rectally once. 09/14/16 09/14/16  Sharman Cheek, MD  fluvoxaMINE (LUVOX) 100 MG tablet Take 1 tablet (100 mg total) by mouth at bedtime. 09/18/15   Pucilowska, Ellin Goodie, MD    Allergies Patient has no known allergies.  No family history on file.  Social History Social History  Substance Use Topics  . Smoking status:  Current Every Day Smoker    Types: Cigarettes  . Smokeless tobacco: Never Used  . Alcohol use No    Review of Systems Constitutional: No fever/chills Eyes: No visual changes. ENT: No sore throat. Cardiovascular: Denies chest pain. Respiratory: Denies shortness of breath. Gastrointestinal: No abdominal pain.  No nausea, no vomiting.  No diarrhea.  No constipation. Genitourinary: Negative for dysuria. Musculoskeletal: Negative for neck pain.  Negative for back pain. Integumentary: Negative for rash. Neurological: Episode of seizure-like activity similar to prior . Negative for headaches, focal weakness or numbness.   ____________________________________________   PHYSICAL EXAM:  VITAL SIGNS: ED Triage Vitals  Enc Vitals Group     BP 09/26/16 2157 133/84     Pulse Rate 09/26/16 2157 71     Resp 09/26/16 2157 16     Temp 09/26/16 2157 98.6 F (37 C)     Temp Source 09/26/16 2157 Oral     SpO2 09/26/16 2157 100 %     Weight 09/26/16 2158 73.5 kg (162 lb)     Height 09/26/16 2158 1.854 m (6\' 1" )     Head Circumference --      Peak Flow --      Pain Score 09/26/16 2157 0     Pain Loc --      Pain Edu? --      Excl. in GC? --     Constitutional: Alert and oriented. Well appearing and in no acute distress. Eyes: Conjunctivae are normal.  Head: Atraumatic. Nose: No congestion/rhinnorhea. Mouth/Throat: Mucous membranes are moist. Neck: No stridor.  No meningeal signs.  No cervical spine tenderness to palpation. Cardiovascular: Normal rate, regular rhythm. Good peripheral circulation. Grossly normal heart sounds. Respiratory: Normal respiratory effort.  No retractions. Lungs CTAB. Gastrointestinal: Soft and nontender. No distention.  Musculoskeletal: No lower extremity tenderness nor edema. No gross deformities of extremities. Neurologic:  Normal speech and language. No gross focal neurologic deficits are appreciated.  Skin:  Skin is warm, dry and intact. No rash  noted. Psychiatric: Mood and affect are normal. Speech and behavior are normal.  Calm and cooperative.  ____________________________________________   LABS (all labs ordered are listed, but only abnormal results are displayed)  Labs Reviewed  BASIC METABOLIC PANEL - Abnormal; Notable for the following:       Result Value   Glucose, Bld 107 (*)    BUN 26 (*)    All other components within normal limits  CBC   ____________________________________________  EKG  None - EKG not ordered by ED physician ____________________________________________  RADIOLOGY   No results found.  ____________________________________________   PROCEDURES  Critical Care performed: No   Procedure(s) performed:   Procedures   ____________________________________________   INITIAL IMPRESSION / ASSESSMENT AND PLAN / ED COURSE  Pertinent labs & imaging results that were available during my care of the patient were reviewed by me and considered in my medical decision making (see chart for details).  The patient was observed for quite a few hours in the emergency department and  had no seizure-like activity.  After I reviewed the electronic medical record extensively, I updated the patient and his father about the situation.  I understand that the patient has a legal guardian with the state, but his father is present in the middle of the night because he is concerned about the patient, and the patient wanted his father to be involved.  I was very honest with both of them and told them what had been documented previously included the reassuring workups from a neurological perspective.  I encouraged him to continue following up with his mental health providers and continue on his regular medications.  The patient understands and agrees and is comfortable going back to the group home.  There is no indication for any additional imaging or workup at this time and I do not feel that any additional  psychiatric medicine or antiepileptic drugs would be beneficial.  I gave my usual and customary return precautions.         ____________________________________________  FINAL CLINICAL IMPRESSION(S) / ED DIAGNOSES  Final diagnoses:  Seizure-like activity (HCC)  Pseudoseizure     MEDICATIONS GIVEN DURING THIS VISIT:  Medications - No data to display   NEW OUTPATIENT MEDICATIONS STARTED DURING THIS VISIT:  Discharge Medication List as of 09/27/2016  3:30 AM      Discharge Medication List as of 09/27/2016  3:30 AM      Discharge Medication List as of 09/27/2016  3:30 AM       Note:  This document was prepared using Dragon voice recognition software and may include unintentional dictation errors.    Loleta RoseForbach, Janeice Stegall, MD 09/27/16 [phone]

## 2016-09-27 NOTE — ED Notes (Signed)
Patient reports having a seizure earlier tonight, states felt himself stumbling around and then told he had a seizure.  Patient reports having a headache at this time.

## 2016-09-30 ENCOUNTER — Other Ambulatory Visit: Payer: Self-pay | Admitting: Nurse Practitioner

## 2016-09-30 DIAGNOSIS — R569 Unspecified convulsions: Secondary | ICD-10-CM

## 2016-10-07 ENCOUNTER — Ambulatory Visit
Admission: RE | Admit: 2016-10-07 | Discharge: 2016-10-07 | Disposition: A | Discharge status: Home / Self Care | Payer: Medicaid Other | Source: Ambulatory Visit | Attending: Nurse Practitioner | Admitting: Nurse Practitioner

## 2016-10-07 DIAGNOSIS — R569 Unspecified convulsions: Secondary | ICD-10-CM | POA: Diagnosis not present

## 2016-10-07 MED ORDER — GADOBENATE DIMEGLUMINE 529 MG/ML IV SOLN
15.0000 mL | Freq: Once | INTRAVENOUS | Status: AC | PRN
  Administered 2016-10-07: 15 mL via INTRAVENOUS

## 2016-10-11 ENCOUNTER — Emergency Department
Admission: EM | Admit: 2016-10-11 | Discharge: 2016-10-12 | Disposition: A | Discharge status: Home / Self Care | Payer: Medicaid Other | Attending: Emergency Medicine | Admitting: Emergency Medicine

## 2016-10-11 ENCOUNTER — Encounter: Payer: Self-pay | Admitting: *Deleted

## 2016-10-11 DIAGNOSIS — F17210 Nicotine dependence, cigarettes, uncomplicated: Secondary | ICD-10-CM | POA: Diagnosis not present

## 2016-10-11 DIAGNOSIS — Z79899 Other long term (current) drug therapy: Secondary | ICD-10-CM | POA: Diagnosis not present

## 2016-10-11 DIAGNOSIS — R569 Unspecified convulsions: Secondary | ICD-10-CM | POA: Diagnosis present

## 2016-10-11 LAB — BASIC METABOLIC PANEL
Anion gap: 8 (ref 5–15)
BUN: 13 mg/dL (ref 6–20)
CALCIUM: 9.1 mg/dL (ref 8.9–10.3)
CO2: 25 mmol/L (ref 22–32)
CREATININE: 1.03 mg/dL (ref 0.61–1.24)
Chloride: 107 mmol/L (ref 101–111)
GFR calc Af Amer: >60 mL/min (ref >60)
GFR calc non Af Amer: >60 mL/min (ref >60)
GLUCOSE: 123 mg/dL — AB (ref 65–99)
Potassium: 3.5 mmol/L (ref 3.5–5.1)
Sodium: 140 mmol/L (ref 135–145)

## 2016-10-11 LAB — MAGNESIUM: Magnesium: 2 mg/dL (ref 1.7–2.4)

## 2016-10-11 LAB — PHOSPHORUS: Phosphorus: 3.9 mg/dL (ref 2.5–4.6)

## 2016-10-11 NOTE — ED Notes (Signed)
Seizure pads in place

## 2016-10-11 NOTE — ED Notes (Signed)
Call was placed to patient's legal guardian with the state agency. Call Service states on-call social worker would call back.

## 2016-10-11 NOTE — ED Notes (Addendum)
Phone call was made to Jamey ReasJan Hook at [facility] who gave permission to treat and stated that she would pick up the patient. Hook gave the name Alric RanDeCarlos West as state guardian [phone] and that she would contact the guardian.

## 2016-10-11 NOTE — ED Notes (Signed)
Joel Arroyo from [employer] called and stated that she spoke with the patient's legal guardian who gave permission to treat.

## 2016-10-11 NOTE — ED Notes (Signed)
Attempted 2nd call to group home unsuccessful.

## 2016-10-11 NOTE — ED Provider Notes (Signed)
[facility] Emergency Department Provider Note  ____________________________________________  Time seen: Approximately 10:58 PM  I have reviewed the triage vital signs and the nursing notes.   HISTORY  Chief Complaint Seizures    HPI Joel Arroyo is a 21 y.o. male brought to the ED by EMS due to apparently passing out outside of the [employer] where he was noted by bystanders to have shaking activity. He's had multiple previous evaluations for possible seizures, currently they appear to be nonepileptic but he is still undergoing evaluation by neurology. He reports they're considering referring him to [facility]. Denies any complaints at all except that he feels like he bit the tip of his tongue. No urinary incontinence. No neck pain. 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 No recent fever vision changes paresthesias or weakness.     Past Medical History:  Diagnosis Date  . Bipolar 1 disorder (HCC)   . Seizures [facility])      Patient Active Problem List   Diagnosis Date Noted  . Seizure-like activity (HCC)   . Psychosis   . Seizures (HCC) 09/06/2016  . Bipolar 2 disorder, major depressive episode (HCC) 09/15/2015  . Suicidal ideation 09/15/2015  . Homicidal ideation 09/15/2015  . ADHD (attention deficit hyperactivity disorder) 09/14/2015  . Involuntary commitment 09/14/2015     Past Surgical History:  Procedure Laterality Date  . HERNIA REPAIR       Prior to Admission medications   Medication Sig Start Date End Date Taking? Authorizing Provider  Albuterol Sulfate 108 (90 Base) MCG/ACT AEPB Inhale 1 puff into the lungs every 4 (four) hours as needed.   Yes [provider]  clindamycin-benzoyl peroxide (BENZACLIN) gel Apply 1 application topically 2 (two) times daily.   Yes [provider]  diazepam (DIASTAT ACUDIAL) 10 MG GEL Place 10 mg rectally once.  09/14/16 10/11/16 Yes Sharman Cheek, MD  fluticasone (FLOVENT HFA) 220 MCG/ACT inhaler Inhale 1 puff into the lungs 2 (two) times daily.   Yes [provider]  fluvoxaMINE (LUVOX) 100 MG tablet Take 1 tablet (100 mg total) by mouth at bedtime. 09/18/15  Yes Pucilowska, Jolanta B, MD  ibuprofen (ADVIL,MOTRIN) 800 MG tablet Take 1 tablet by mouth every 6 (six) hours as needed.   Yes [provider]  lamoTRIgine (LAMICTAL) 25 MG tablet Take 2 tablets by mouth daily. 09/29/16 11/10/16 Yes [provider]  lisdexamfetamine (VYVANSE) 50 MG capsule Take 50 mg by mouth daily.   Yes [provider]  Oxcarbazepine (TRILEPTAL) 300 MG tablet Take 1 tablet (300 mg total) by mouth 2 (two) times daily. 09/18/15  Yes Pucilowska, Jolanta B, MD  traZODone (DESYREL) 100 MG tablet Take 150 mg by mouth at bedtime.   Yes [provider]  lurasidone (LATUDA) 20 MG TABS tablet Take 20 mg by mouth daily.    [provider]     Allergies Patient has no known allergies.   No family history on file.  Social History Social History  Substance Use Topics  . Smoking status: Current Every Day Smoker    Types: Cigarettes  . Smokeless tobacco: Never Used  . Alcohol use No    Review of Systems  Constitutional:   No fever or chills.  ENT:   No sore throat. No rhinorrhea. Cardiovascular:   No chest pain or syncope. Respiratory:   No dyspnea or cough. Gastrointestinal:   Negative  for abdominal pain, vomiting and diarrhea.  Musculoskeletal:   Negative for focal pain or swelling All other systems reviewed and are negative except as documented above in ROS and HPI.  ____________________________________________   PHYSICAL EXAM:  VITAL SIGNS: ED Triage Vitals  Enc Vitals Group     BP 10/11/16 2049 114/80     Pulse Rate 10/11/16 2049 86     Resp 10/11/16 2049 18     Temp 10/11/16 2049 98.9 F (37.2 C)     Temp Source 10/11/16 2049 Oral     SpO2 10/11/16 2047  100 %     Weight 10/11/16 2051 163 lb (73.9 kg)     Height 10/11/16 2051 6\' 1"  (1.854 m)     Head Circumference --      Peak Flow --      Pain Score 10/11/16 2049 8     Pain Loc --      Pain Edu? --      Excl. in GC? --     Vital signs reviewed, nursing assessments reviewed.   Constitutional:   Alert and oriented. Well appearing and in no distress. Eyes:   No scleral icterus.  EOMI. No nystagmus. No conjunctival pallor. PERRL. ENT   Head:   Normocephalic and atraumatic.There is a pinpoint area that the patient identifies on the left parietal scalp which is painful and apparently tender to the touch. No swelling or other inflammatory changes. No wounds. No bony step-off or crepitus. No evidence of acute injury.   Nose:   No congestion/rhinnorhea.    Mouth/Throat:   MMM, no pharyngeal erythema. No peritonsillar mass.    Neck:   No meningismus. Full ROM Hematological/Lymphatic/Immunilogical:   No cervical lymphadenopathy. Cardiovascular:   RRR. Symmetric bilateral radial and DP pulses.  No murmurs.  Respiratory:   Normal respiratory effort without tachypnea/retractions. Breath sounds are clear and equal bilaterally. No wheezes/rales/rhonchi. Gastrointestinal:   Soft and nontender. Non distended. There is no CVA tenderness.  No rebound, rigidity, or guarding. Genitourinary:   deferred Musculoskeletal:   Normal range of motion in all extremities. No joint effusions.  No lower extremity tenderness.  No edema. Neurologic:   Normal speech and language.  Motor grossly intact. No gross focal neurologic deficits are appreciated.  Skin:    Skin is warm, dry and intact. No rash noted.  No petechiae, purpura, or bullae.  ____________________________________________    LABS (pertinent positives/negatives) (all labs ordered are listed, but only abnormal results are displayed) Labs Reviewed  BASIC METABOLIC PANEL - Abnormal; Notable for the following:       Result Value   Glucose,  Bld 123 (*)    All other components within normal limits  MAGNESIUM  PHOSPHORUS   ____________________________________________   EKG    ____________________________________________    RADIOLOGY  No results found.  ____________________________________________   PROCEDURES Procedures  ____________________________________________   INITIAL IMPRESSION / ASSESSMENT AND PLAN / ED COURSE  Pertinent labs & imaging results that were available during my care of the patient were reviewed by me and considered in my medical decision making (see chart for details).  Patient well appearing no acute distress, no acute symptoms. Brought to ED after bystanders called EMS when he had a loss of consciousness in public, possible recurrent seizure activity. He has had multiple workups in the past including a recent repeat MRI which was again normal, no clear source for his symptoms at this point, but I did encourage continued neurology follow-up.Considering the patient's  symptoms, medical history, and physical examination today, I have low suspicion for ischemic stroke, intracranial hemorrhage, meningitis, encephalitis, carotid or vertebral dissection, venous sinus thrombosis, MS, intracranial hypertension, glaucoma, CRAO, CRVO, or temporal arteritis.       ____________________________________________   FINAL CLINICAL IMPRESSION(S) / ED DIAGNOSES  Final diagnoses:  Seizure-like activity (HCC)      New Prescriptions   No medications on file     Portions of this note were generated with dragon dictation software. Dictation errors may occur despite best attempts at proofreading.    Sharman Cheek, MD 10/11/16 2302

## 2016-10-11 NOTE — ED Notes (Signed)
Attempted to call group home without success, unable to leave message.

## 2016-10-11 NOTE — ED Triage Notes (Addendum)
Per EMS report, patient had a [place] [employer] [place] tonight. There were no witnesses to patient's falling to the ground, but witnesses stated that they saw the patient "shaking." Patient c/o occipital head pain from hitting the ground and tongue pain from biting his tongue. Patient was not incontinent to urine or stool. Patient is being followed by a neurologist, but is unable to remember his name.  Patient states he takes his seizure meds every day, but cannot remember the name of the med. Patient arrived alert and oriented x4.

## 2016-10-12 NOTE — ED Notes (Signed)
Attempted 3rd call to group home without success.

## 2016-10-12 NOTE — ED Notes (Signed)
Received call from group home that they were on the way to pick up patient.

## 2016-10-12 NOTE — ED Notes (Signed)
Called [employer] and requested that [place] police officer go to group home and have staff call the ED.

## 2016-10-13 DIAGNOSIS — F3131 Bipolar disorder, current episode depressed, mild: Secondary | ICD-10-CM | POA: Insufficient documentation

## 2017-01-30 ENCOUNTER — Emergency Department
Admission: EM | Admit: 2017-01-30 | Discharge: 2017-01-31 | Disposition: A | Discharge status: Home / Self Care | Payer: Medicaid Other | Attending: Emergency Medicine | Admitting: Emergency Medicine

## 2017-01-30 ENCOUNTER — Encounter: Payer: Self-pay | Admitting: *Deleted

## 2017-01-30 ENCOUNTER — Other Ambulatory Visit: Payer: Self-pay

## 2017-01-30 DIAGNOSIS — F445 Conversion disorder with seizures or convulsions: Secondary | ICD-10-CM | POA: Diagnosis not present

## 2017-01-30 DIAGNOSIS — Z79899 Other long term (current) drug therapy: Secondary | ICD-10-CM | POA: Diagnosis not present

## 2017-01-30 DIAGNOSIS — R51 Headache: Secondary | ICD-10-CM | POA: Insufficient documentation

## 2017-01-30 DIAGNOSIS — F17210 Nicotine dependence, cigarettes, uncomplicated: Secondary | ICD-10-CM | POA: Insufficient documentation

## 2017-01-30 DIAGNOSIS — R519 Headache, unspecified: Secondary | ICD-10-CM

## 2017-01-30 DIAGNOSIS — R569 Unspecified convulsions: Secondary | ICD-10-CM | POA: Diagnosis present

## 2017-01-30 MED ORDER — ONDANSETRON 4 MG PO TBDP
4.0000 mg | ORAL_TABLET | Freq: Once | ORAL | Status: AC
  Administered 2017-01-31: 4 mg via ORAL
  Filled 2017-01-30: qty 1

## 2017-01-30 MED ORDER — ACETAMINOPHEN 325 MG PO TABS
650.0000 mg | ORAL_TABLET | Freq: Once | ORAL | Status: AC
Start: 2017-01-31 — End: 2017-01-31
  Administered 2017-01-31: 650 mg via ORAL
  Filled 2017-01-30: qty 2

## 2017-01-30 NOTE — ED Notes (Signed)
Pt to ED reporting seizure like activity tonight. Pt was brought to ED by group home leader who reports the activity but does not know length pr description. PT reports having no memory. 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 Pt reports he has been taking medication regularly. NAD noted at this time.   Pt denies feeling sick or increased anxiety. Pt is reporting a headache at this time. No neuro deficits noted.

## 2017-01-30 NOTE — ED Provider Notes (Signed)
[facility] Emergency Department Provider Note  ____________________________________________   First MD Initiated Contact with Patient 01/30/17 2347     (approximate)  I have reviewed the triage vital signs and the nursing notes.   HISTORY  Chief Complaint Seizures  History May be limited by the patient's chronic and well-documented mental illness.  He has a legal guardian of the state and lives in a group home.    HPI Joel Arroyo is a 21 y.o. male with medical/psychiatric history as documented below who presents for evaluation of reported seizure-like activity, although the patient reports that he is here because of a mild headache that has been bothering him intermittently for about a month.  He states that "I just don't feel good" and that it has been going on for more than a week even though the headache has been going on for about a month.  He states that he thinks it is because of the weather.  He does not have any other specific complaints except that he says he has not eaten all day and he was so hungry that his stomach felt upset and he vomited once in the waiting room.  He denies fever/chills, visual disturbances, chest pain, shortness of breath, and dysuria.  He reports a little bit of abdominal pain "sometimes" and he had one episode of vomiting in the waiting room.  He reports some persistent nausea at this time but it is mild and he thinks that he just needs something to eat.  He is uncertain about the seizure-like activity.  He has a well-documented history of pseudoseizures and I have seen the patient in the past.  I mention to him that I remembered him telling me in the past that he has the shaking episodes when he is upset about things and ask him if it is similar this time and he said that yes, he is still working through some things and has "some grudges" that make him upset sometimes.  At this time he states that he thinks he just needs  something to eat and would like to go back to his group home to get a good night sleep "because it is the weekend".  Past Medical History:  Diagnosis Date  . Bipolar 1 disorder (HCC)   . Seizures [facility](HCC)     Patient Active Problem List   Diagnosis Date Noted  . Seizure-like activity (HCC)   . Psychosis (HCC)   . Seizures (HCC) 09/06/2016  . Bipolar 2 disorder, major depressive episode (HCC) 09/15/2015  . Suicidal ideation 09/15/2015  . Homicidal ideation 09/15/2015  . ADHD (attention deficit hyperactivity disorder) 09/14/2015  . Involuntary commitment 09/14/2015    Past Surgical History:  Procedure Laterality Date  . HERNIA REPAIR      Prior to Admission medications   Medication Sig Start Date End Date Taking? Authorizing Provider  Albuterol Sulfate 108 (90 Base) MCG/ACT AEPB Inhale 1 puff into the lungs every 4 (four) hours as needed.    [provider]  clindamycin-benzoyl peroxide (BENZACLIN) gel Apply 1 application topically 2 (two) times daily.    [provider]  diazepam (DIASTAT ACUDIAL) 10 MG GEL Place 10 mg rectally once. 09/14/16 10/11/16  Sharman CheekStafford, Phillip, MD  fluticasone (FLOVENT HFA) 220 MCG/ACT inhaler Inhale 1 puff into the lungs 2 (two) times daily.    [provider]  fluvoxaMINE (LUVOX) 100 MG tablet Take 1 tablet (100 mg total) by mouth at bedtime. 09/18/15   Pucilowska, Braulio ConteJolanta  B, MD  ibuprofen (ADVIL,MOTRIN) 800 MG tablet Take 1 tablet by mouth every 6 (six) hours as needed.    [provider]  lamoTRIgine (LAMICTAL) 25 MG tablet Take 2 tablets by mouth daily. 09/29/16 11/10/16  [provider]  lisdexamfetamine (VYVANSE) 50 MG capsule Take 50 mg by mouth daily.    [provider]  lurasidone (LATUDA) 20 MG TABS tablet Take 20 mg by mouth daily.    [provider]  Oxcarbazepine (TRILEPTAL) 300 MG tablet Take 1 tablet (300 mg total) by mouth 2 (two) times daily. 09/18/15   Pucilowska, Braulio ConteJolanta B, MD    traZODone (DESYREL) 100 MG tablet Take 150 mg by mouth at bedtime.    [provider]    Allergies Patient has no known allergies.  History reviewed. No pertinent family history.  Social History Social History   Tobacco Use  . Smoking status: Current Every Day Smoker    Packs/day: 0.50    Types: Cigarettes  . Smokeless tobacco: Never Used  Substance Use Topics  . Alcohol use: No  . Drug use: No    Review of Systems Constitutional: No fever/chills Eyes: No visual changes. ENT: No sore throat. Cardiovascular: Denies chest pain. Respiratory: Denies shortness of breath. Gastrointestinal: No abdominal pain.  Mild nausea, one episode of vomiting in the waiting.  Reports he is very hungry..  No diarrhea.  No constipation. Genitourinary: Negative for dysuria. Musculoskeletal: Negative for neck pain.  Negative for back pain. Integumentary: Negative for rash. Neurological: Mild global dull intermittent headache for about a month, no focal weakness or numbness.   ____________________________________________   PHYSICAL EXAM:  VITAL SIGNS: ED Triage Vitals [01/30/17 2149]  Enc Vitals Group     BP 118/79     Pulse Rate 88     Resp 16     Temp 98 F (36.7 C)     Temp Source Oral     SpO2 97 %     Weight 73.9 kg (163 lb)     Height 1.854 m (6\' 1" )     Head Circumference      Peak Flow      Pain Score 10     Pain Loc      Pain Edu?      Excl. in GC?     Constitutional: Alert and oriented. Well appearing and in no acute distress. Eyes: Conjunctivae are normal. PERRL. EOMI. Normal fundoscopic exam without papilledema Head: Atraumatic. Nose: No congestion/rhinnorhea. Mouth/Throat: Mucous membranes are moist. Neck: No stridor.  No meningeal signs.   Cardiovascular: Normal rate, regular rhythm. Good peripheral circulation. Grossly normal heart sounds. Respiratory: Normal respiratory effort.  No retractions. Lungs CTAB. Gastrointestinal: Soft and nontender. No  distention.  Musculoskeletal: No lower extremity tenderness nor edema. No gross deformities of extremities. Neurologic:  Normal speech and language. No gross focal neurologic deficits are appreciated.  Skin:  Skin is warm, dry and intact. No rash noted. Psychiatric: Mood and affect are normal. Speech and behavior are normal.  ____________________________________________   LABS (all labs ordered are listed, but only abnormal results are displayed)  Labs Reviewed  CBG MONITORING, ED   ____________________________________________  EKG  ED ECG REPORT I, Andreika Vandagriff, the attending physician, personally viewed and interpreted this ECG.  Date: 01/30/2017 EKG Time: 21:59 Rate: 68 Rhythm: normal sinus rhythm QRS Axis: normal Intervals: normal ST/T Wave abnormalities: Minimal ST elevation most consistent with early repolarization in this otherwise healthy young male Narrative Interpretation: no evidence of  acute ischemia   ____________________________________________  RADIOLOGY   No results found.  ____________________________________________   PROCEDURES  Critical Care performed: No   Procedure(s) performed:   Procedures   ____________________________________________   INITIAL IMPRESSION / ASSESSMENT AND PLAN / ED COURSE  As part of my medical decision making, I reviewed the following data within the electronic MEDICAL RECORD NUMBER Nursing notes reviewed and incorporated, Old chart reviewed and Notes from prior ED visits    The patient is known to me from prior emergency department visit.  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  I had a discussion with the patient about his symptoms and he did clarify that he feels worse and has the seizure-like activity when he is upset and that he "holds grudges" and seems to feel worse when he is feeling bad in general.  His vital signs are  all stable and he is well-appearing and in no acute distress.  His main concern at the moment is that he is hungry because he has not eaten "all day" and he thinks that the episode of vomiting in the waiting room is because he is hungry.  I mentioned the possibility of checking lab work but there is nothing in particular that which I am specifically concerned or for which I am looking.  Differential diagnosis does include epileptic seizures, metabolic/electrolyte abnormalities, infectious including both bacterial and viral illnesses, biliary colic, etc.  However the patient has a normal physical exam and normal vital signs and I think that most likely he had some emotional upset possibly in the setting of a viral illness.  After we discussed his symptoms and his history he seemed to feel better and his preference is to get something to eat after some Zofran and Tylenol and I think this is appropriate.  If he is not able to tolerate anything by mouth and of course we will check lab work but at this point I do not see any indication.  There is no evidence of an acute or emergent medical condition at this time.  Prior to his discharge his nurse will contact his legal guardian and discuss his workup.  Clinical Course as of [name] 31 117  Sat [name] 31, 2017  0104 Patient tolerated sandwich tray without difficulty, says he feels better and is ready to go home.  Onalee Hua (RN) will talk with legal guardian and we will discharge.  [CF]    Clinical Course User Index [CF] Loleta Rose, MD    ____________________________________________  FINAL CLINICAL IMPRESSION(S) / ED DIAGNOSES  Final diagnoses:  Nonintractable episodic headache, unspecified headache type  Pseudoseizure     MEDICATIONS GIVEN DURING THIS VISIT:  Medications  ondansetron (ZOFRAN-ODT) disintegrating tablet 4 mg (4 mg Oral Given 01/31/17 0003)  acetaminophen (TYLENOL) tablet 650 mg (650 mg Oral Given 01/31/17 0003)     ED Discharge Orders     None       Note:  This document was prepared using Dragon voice recognition software and may include unintentional dictation errors.    Loleta Rose, MD 01/31/17 [phone]

## 2017-01-31 NOTE — Discharge Instructions (Signed)
Your workup in the Emergency Department today was reassuring.  We did not find any specific abnormalities.  We recommend you drink plenty of fluids, take your regular medications and/or any new ones prescribed today, and follow up with the doctor(s) listed in these documents as recommended.  Return to the Emergency Department if you develop new or worsening symptoms that concern you.  

## 2017-01-31 NOTE — ED Notes (Signed)
Patient given food tray and ginger ale, will re-evaluate after consumed.

## 2017-01-31 NOTE — ED Notes (Signed)
Called patient's group home contact at [phone] Mrs. Cathlean CowerBaldwin to pick patient up.  She stated she would be her in about 20 minutes.

## 2017-01-31 NOTE — ED Notes (Signed)
Discussed with the patient and all questioned fully answered. He will call if any problems arise.  Ambulatory to lobby.

## 2017-01-31 NOTE — ED Notes (Signed)
Mrs. Joel Arroyo is her to pick up patient to take back to group home.

## 2017-01-31 NOTE — ED Notes (Signed)
[phone] contacted for patient report/disposition prior to dc.

## 2017-01-31 NOTE — ED Notes (Signed)
[place] [facility] [facility] Joneen RoachCarmen Charlton [phone] prior to patient discharge.

## 2017-02-13 ENCOUNTER — Emergency Department
Admission: EM | Admit: 2017-02-13 | Discharge: 2017-02-14 | Disposition: A | Discharge status: Home / Self Care | Payer: Medicaid Other | Attending: Emergency Medicine | Admitting: Emergency Medicine

## 2017-02-13 ENCOUNTER — Encounter: Payer: Self-pay | Admitting: Emergency Medicine

## 2017-02-13 DIAGNOSIS — Z79899 Other long term (current) drug therapy: Secondary | ICD-10-CM | POA: Diagnosis not present

## 2017-02-13 DIAGNOSIS — F17210 Nicotine dependence, cigarettes, uncomplicated: Secondary | ICD-10-CM | POA: Diagnosis not present

## 2017-02-13 DIAGNOSIS — G40909 Epilepsy, unspecified, not intractable, without status epilepticus: Secondary | ICD-10-CM | POA: Diagnosis not present

## 2017-02-13 HISTORY — DX: Unspecified asthma, uncomplicated: J45.909

## 2017-02-13 LAB — CBC
HEMATOCRIT: 45.6 % (ref 40.0–52.0)
Hemoglobin: 15.6 g/dL (ref 13.0–18.0)
MCH: 32.7 pg (ref 26.0–34.0)
MCHC: 34.2 g/dL (ref 32.0–36.0)
MCV: 95.4 fL (ref 80.0–100.0)
Platelets: 259 10*3/uL (ref 150–440)
RBC: 4.78 MIL/uL (ref 4.40–5.90)
RDW: 13.2 % (ref 11.5–14.5)
WBC: 8.1 10*3/uL (ref 3.8–10.6)

## 2017-02-13 LAB — URINE DRUG SCREEN, QUALITATIVE (ARMC ONLY)
Amphetamines, Ur Screen: POSITIVE — AB
BARBITURATES, UR SCREEN: NOT DETECTED
Benzodiazepine, Ur Scrn: NOT DETECTED
CANNABINOID 50 NG, UR ~~LOC~~: NOT DETECTED
COCAINE METABOLITE, UR ~~LOC~~: NOT DETECTED
MDMA (Ecstasy)Ur Screen: NOT DETECTED
Methadone Scn, Ur: NOT DETECTED
OPIATE, UR SCREEN: NOT DETECTED
PHENCYCLIDINE (PCP) UR S: NOT DETECTED
Tricyclic, Ur Screen: NOT DETECTED

## 2017-02-13 LAB — COMPREHENSIVE METABOLIC PANEL
ALK PHOS: 72 U/L (ref 38–126)
ALT: 18 U/L (ref 17–63)
AST: 28 U/L (ref 15–41)
Albumin: 4.9 g/dL (ref 3.5–5.0)
Anion gap: 10 (ref 5–15)
BUN: 16 mg/dL (ref 6–20)
CALCIUM: 9.4 mg/dL (ref 8.9–10.3)
CO2: 25 mmol/L (ref 22–32)
CREATININE: 0.92 mg/dL (ref 0.61–1.24)
Chloride: 103 mmol/L (ref 101–111)
Glucose, Bld: 89 mg/dL (ref 65–99)
Potassium: 3.5 mmol/L (ref 3.5–5.1)
Sodium: 138 mmol/L (ref 135–145)
Total Bilirubin: 0.7 mg/dL (ref 0.3–1.2)
Total Protein: 7.8 g/dL (ref 6.5–8.1)

## 2017-02-13 LAB — GLUCOSE, CAPILLARY: Glucose-Capillary: 108 mg/dL — ABNORMAL HIGH (ref 65–99)

## 2017-02-13 LAB — MAGNESIUM: MAGNESIUM: 2.2 mg/dL (ref 1.7–2.4)

## 2017-02-13 MED ORDER — SODIUM CHLORIDE 0.9 % IV BOLUS (SEPSIS)
1000.0000 mL | Freq: Once | INTRAVENOUS | Status: AC
  Administered 2017-02-13: 1000 mL via INTRAVENOUS

## 2017-02-13 MED ORDER — KETOROLAC TROMETHAMINE 30 MG/ML IJ SOLN
30.0000 mg | Freq: Once | INTRAMUSCULAR | Status: AC
  Administered 2017-02-13: 30 mg via INTRAVENOUS
  Filled 2017-02-13: qty 1

## 2017-02-13 NOTE — ED Notes (Signed)
EMS found in room att

## 2017-02-13 NOTE — ED Provider Notes (Signed)
[facility] Emergency Department Provider Note ____________________________________________   First MD Initiated Contact with Patient 02/13/17 2051     (approximate)  I have reviewed the triage vital signs and the nursing notes.   HISTORY  Chief Complaint Seizures    HPI Joel Arroyo is a 21 y.o. male with a history of seizure disorder and ? pseudoseizures who presents with a witnessed seizure at his group home, described as generalized, associated with urinary incontinence.  Patient states that he remembers eating dinner, and the next thing he was aware of he was in the hospital.  Patient states he has a headache, but denies any other complaints currently.  He reports that he has been stressed out and has been getting less sleep than usual recently, but states he is compliant with his seizure medication, and there has been no recent change in his seizure medication.  He states his last seizure was several weeks ago, and they are relatively frequent normally.   Past Medical History:  Diagnosis Date  . Asthma   . Bipolar 1 disorder (HCC)   . Seizures [facility](HCC)     Patient Active Problem List   Diagnosis Date Noted  . Seizure-like activity (HCC)   . Psychosis (HCC)   . Seizures (HCC) 09/06/2016  . Bipolar 2 disorder, major depressive episode (HCC) 09/15/2015  . Suicidal ideation 09/15/2015  . Homicidal ideation 09/15/2015  . ADHD (attention deficit hyperactivity disorder) 09/14/2015  . Involuntary commitment 09/14/2015    Past Surgical History:  Procedure Laterality Date  . HERNIA REPAIR      Prior to Admission medications   Medication Sig Start Date End Date Taking? Authorizing Provider  Albuterol Sulfate 108 (90 Base) MCG/ACT AEPB Inhale 1 puff into the lungs every 4 (four) hours as needed.    [provider]  clindamycin-benzoyl peroxide (BENZACLIN) gel Apply 1 application topically 2 (two) times daily.    [provider]    diazepam (DIASTAT ACUDIAL) 10 MG GEL Place 10 mg rectally once. 09/14/16 10/11/16  Sharman CheekStafford, Phillip, MD  fluticasone (FLOVENT HFA) 220 MCG/ACT inhaler Inhale 1 puff into the lungs 2 (two) times daily.    [provider]  fluvoxaMINE (LUVOX) 100 MG tablet Take 1 tablet (100 mg total) by mouth at bedtime. 09/18/15   Pucilowska, Jolanta B, MD  ibuprofen (ADVIL,MOTRIN) 800 MG tablet Take 1 tablet by mouth every 6 (six) hours as needed.    [provider]  lamoTRIgine (LAMICTAL) 25 MG tablet Take 2 tablets by mouth daily. 09/29/16 11/10/16  [provider]  lisdexamfetamine (VYVANSE) 50 MG capsule Take 50 mg by mouth daily.    [provider]  lurasidone (LATUDA) 20 MG TABS tablet Take 20 mg by mouth daily.    [provider]  Oxcarbazepine (TRILEPTAL) 300 MG tablet Take 1 tablet (300 mg total) by mouth 2 (two) times daily. 09/18/15   Pucilowska, Braulio ConteJolanta B, MD  traZODone (DESYREL) 100 MG tablet Take 150 mg by mouth at bedtime.    [provider]    Allergies Patient has no known allergies.  History reviewed. No pertinent family history.  Social History Social History   Tobacco Use  . Smoking status: Current Every Day Smoker    Packs/day: 0.50    Types: Cigarettes  . Smokeless tobacco: Never Used  Substance Use Topics  . Alcohol use: No  . Drug use: No    Review of Systems  Constitutional: No fever. Eyes: No visual changes. ENT:  No neck pain. Cardiovascular: Denies chest pain. Respiratory: Denies shortness of breath. Gastrointestinal: No nausea, no vomiting.   Genitourinary: Negative for dysuria.  Musculoskeletal: Positive for muscle pains. Skin: Negative for rash. Neurological: Positive for headache.   ____________________________________________   PHYSICAL EXAM:  VITAL SIGNS: ED Triage Vitals  Enc Vitals Group     BP 02/13/17 2005 121/81     Pulse Rate 02/13/17 2005 86     Resp 02/13/17 2005 18     Temp 02/13/17 2005  98.7 F (37.1 C)     Temp Source 02/13/17 2005 Oral     SpO2 02/13/17 2005 100 %     Weight 02/13/17 2005 160 lb (72.6 kg)     Height 02/13/17 2005 6\' 1"  (1.854 m)     Head Circumference --      Peak Flow --      Pain Score 02/13/17 2020 5     Pain Loc --      Pain Edu? --      Excl. in GC? --     Constitutional: Alert and oriented. Well appearing and in no acute distress. Eyes: Conjunctivae are normal.  EOMI. PERRLA.  Head: Atraumatic. Nose: No congestion/rhinnorhea. Mouth/Throat: Mucous membranes are moist.   Neck: Normal range of motion.  Cardiovascular: Normal rate, regular rhythm. Grossly normal heart sounds.  Good peripheral circulation. Respiratory: Normal respiratory effort.  No retractions. Lungs CTAB. Gastrointestinal: Soft and nontender. No distention.  Genitourinary: No flank tenderness. Musculoskeletal: No lower extremity edema.  Extremities warm and well perfused.  Neurologic:  Normal speech and language. No gross focal neurologic deficits are appreciated.  Cranial nerves III through XII intact.  Motor and sensory intact in all extremities.  Normal coordination and finger to nose. Skin:  Skin is warm and dry. No rash noted. Psychiatric: Mood and affect are normal. Speech and behavior are normal.  ____________________________________________   LABS (all labs ordered are listed, but only abnormal results are displayed)  Labs Reviewed  URINE DRUG SCREEN, QUALITATIVE ([facility] ONLY) - Abnormal; Notable for the following components:      Result Value   Amphetamines, Ur Screen POSITIVE (*)    All other components within normal limits  GLUCOSE, CAPILLARY - Abnormal; Notable for the following components:   Glucose-Capillary 108 (*)    All other components within normal limits  COMPREHENSIVE METABOLIC PANEL  MAGNESIUM  CBC  CBG MONITORING, ED   ____________________________________________  EKG  ED ECG REPORT I, Dionne Bucy, the attending physician,  personally viewed and interpreted this ECG.  Date: 02/13/2017 EKG Time: 2023 Rate: 72 Rhythm: normal sinus rhythm QRS Axis: normal Intervals: normal ST/T Wave abnormalities: normal Narrative Interpretation: no evidence of acute ischemia; no significant change when compared to EKG of 01/31/2017  ____________________________________________  RADIOLOGY    ____________________________________________   PROCEDURES  Procedure(s) performed: No    Critical Care performed: No ____________________________________________   INITIAL IMPRESSION / ASSESSMENT AND PLAN / ED COURSE  Pertinent labs & imaging results that were available during my care of the patient were reviewed by me and considered in my medical decision making (see chart for details).  21 year old male with history of seizure disorder presents with a generalized seizure this evening after eating dinner.  Patient reports headache but is otherwise without complaints currently.  He reports increased stress and decreased sleep recently, but denies any other precipitating factors.  Denies drug or alcohol use.  He states he is compliant with his seizure medications.  On review of  past medical records in Epic, she has documented history of seizure and pseudoseizure.  He was last seen in the emergency department on 11/9 for a seizure-like episode, and thought to have a pseudoseizure at that time.    At this time, patient's vital signs are normal, and his exam is unremarkable.  His neuro exam is normal.  He has no complaints at this time.  We will obtain labs to rule out any specific precipitating cause of possible seizure, but if negative and patient remains comfortable likely we will be able discharge home.  Prior to discharge his nurse will contact legal guardian discussed the workup.    ----------------------------------------- 11:58 PM on 02/13/2017 -----------------------------------------  Patient's lab workup is  unremarkable.  He remains comfortable appearing, and has had no recurrent seizure activity or other acute symptoms in the ED.  At this time there is no indication for continued observation or further intervention in the ED.  Patient feels okay to go home.  Nurse will contact patient's group home, and arrange for discharge.  ____________________________________________   FINAL CLINICAL IMPRESSION(S) / ED DIAGNOSES  Final diagnoses:  Seizure disorder (HCC)      NEW MEDICATIONS STARTED DURING THIS VISIT:  This SmartLink is deprecated. Use AVSMEDLIST instead to display the medication list for a patient.   Note:  This document was prepared using Dragon voice recognition software and may include unintentional dictation errors.    Dionne BucySiadecki, Analysa Nutting, MD 02/13/17 [phone]

## 2017-02-13 NOTE — ED Notes (Addendum)
[phone] Clinton SawyerByron White   Group Home Coordinator [address] [place]    [phone] Barton Duboisolonel Rogers

## 2017-02-13 NOTE — ED Triage Notes (Signed)
Patient presents to Emergency Department via EMS with complaints of witnessed seizure (by staff) at group home.  EMS reports hx of pseudo seizures and asthma.   EMS reports not postictal but was found with drool face.    Pt reports some urination in jeans, last remembers eating dinner.

## 2017-02-14 NOTE — Discharge Instructions (Signed)
Return to the ER for new, worsening, or recurrent seizures, confusion or change in mental status, or any other new or worsening symptoms that concern you.  Please continue to take your normal seizure medications and follow-up with your neurologist as soon as you are able to.

## 2017-02-14 NOTE — ED Notes (Signed)
[facility] [place] [facility], Dendra, report given to discharge pt,   Social Worker and legal guardian is [place], [phone], will contact this person or supervisor

## 2017-02-14 NOTE — ED Notes (Addendum)
[phone], [place], Lauren, called for patient disposition for discharge: on hold for 13 min, reports to page on call person

## 2017-02-14 NOTE — ED Notes (Signed)
Emogene MorganSherrill Millmore, DSS supervisor, called, report of visit and discharge given, verbal permission given to allow Joel Arroyo (group home owner) to pick up pt, verified with Teacher, early years/preButch RN

## 2017-03-27 ENCOUNTER — Emergency Department
Admission: EM | Admit: 2017-03-27 | Discharge: 2017-03-27 | Disposition: A | Discharge status: Home / Self Care | Payer: Medicaid Other | Attending: Emergency Medicine | Admitting: Emergency Medicine

## 2017-03-27 ENCOUNTER — Other Ambulatory Visit: Payer: Self-pay

## 2017-03-27 DIAGNOSIS — R1110 Vomiting, unspecified: Secondary | ICD-10-CM | POA: Insufficient documentation

## 2017-03-27 DIAGNOSIS — Z79899 Other long term (current) drug therapy: Secondary | ICD-10-CM | POA: Diagnosis not present

## 2017-03-27 DIAGNOSIS — R51 Headache: Secondary | ICD-10-CM | POA: Insufficient documentation

## 2017-03-27 DIAGNOSIS — J45909 Unspecified asthma, uncomplicated: Secondary | ICD-10-CM | POA: Insufficient documentation

## 2017-03-27 DIAGNOSIS — R69 Illness, unspecified: Secondary | ICD-10-CM | POA: Insufficient documentation

## 2017-03-27 DIAGNOSIS — F17210 Nicotine dependence, cigarettes, uncomplicated: Secondary | ICD-10-CM | POA: Diagnosis not present

## 2017-03-27 DIAGNOSIS — R05 Cough: Secondary | ICD-10-CM | POA: Diagnosis not present

## 2017-03-27 DIAGNOSIS — J3489 Other specified disorders of nose and nasal sinuses: Secondary | ICD-10-CM | POA: Diagnosis present

## 2017-03-27 DIAGNOSIS — J029 Acute pharyngitis, unspecified: Secondary | ICD-10-CM | POA: Insufficient documentation

## 2017-03-27 DIAGNOSIS — R0981 Nasal congestion: Secondary | ICD-10-CM | POA: Diagnosis not present

## 2017-03-27 DIAGNOSIS — R197 Diarrhea, unspecified: Secondary | ICD-10-CM | POA: Diagnosis not present

## 2017-03-27 DIAGNOSIS — J111 Influenza due to unidentified influenza virus with other respiratory manifestations: Secondary | ICD-10-CM

## 2017-03-27 MED ORDER — OSELTAMIVIR PHOSPHATE 75 MG PO CAPS: 75.0000 mg | ORAL_CAPSULE | Freq: Two times a day (BID) | ORAL | 0 refills | Status: AC

## 2017-03-27 NOTE — ED Notes (Signed)
Ms. Joel Arroyo, contacted for consent for treatment; states that she is legal guardian but that guardian was supposed to be changed today. Pt to call back with updated information regarding who guardian is.

## 2017-03-27 NOTE — ED Triage Notes (Signed)
Pt c/o body aches, flu like sx. Started today. Pt alert and oriented X4, active, cooperative, pt in NAD. RR even and unlabored, color WNL.

## 2017-03-27 NOTE — ED Notes (Signed)
Ms. Joel GraveSusanna Lester calls back, she remains patient legal guardian; gives consent for medical treatment and directs for patient to go back home the way he came if he is discharged.

## 2017-03-27 NOTE — ED Triage Notes (Signed)
FIRST NURSE NOTE-here for flu like symptoms. Body aches, headache, sore throat, cough. Ambulatory. Unlabored.

## 2017-03-27 NOTE — ED Provider Notes (Signed)
[facility] Emergency Department Provider Note  ____________________________________________  Time seen: Approximately 6:36 PM  I have reviewed the triage vital signs and the nursing notes.   HISTORY  Chief Complaint Generalized Body Aches    HPI Joel Arroyo is a 22 y.o. male presents to the emergency department with rhinorrhea, congestion, headache, nonproductive cough and pharyngitis that started today.  Patient is tolerating fluids and food by mouth.  He reports both emesis and diarrhea.  No recent travel.  Patient currently resides in a group home.  No alleviating measures have been attempted.   Past Medical History:  Diagnosis Date  . Asthma   . Bipolar 1 disorder (HCC)   . Seizures [facility])     Patient Active Problem List   Diagnosis Date Noted  . Seizure-like activity (HCC)   . Psychosis (HCC)   . Seizures (HCC) 09/06/2016  . Bipolar 2 disorder, major depressive episode (HCC) 09/15/2015  . Suicidal ideation 09/15/2015  . Homicidal ideation 09/15/2015  . ADHD (attention deficit hyperactivity disorder) 09/14/2015  . Involuntary commitment 09/14/2015    Past Surgical History:  Procedure Laterality Date  . HERNIA REPAIR      Prior to Admission medications   Medication Sig Start Date End Date Taking? Authorizing Provider  Albuterol Sulfate 108 (90 Base) MCG/ACT AEPB Inhale 1 puff into the lungs every 4 (four) hours as needed.    [provider]  clindamycin-benzoyl peroxide (BENZACLIN) gel Apply 1 application topically 2 (two) times daily.    [provider]  diazepam (DIASTAT ACUDIAL) 10 MG GEL Place 10 mg rectally once. 09/14/16 10/11/16  Sharman Cheek, MD  fluticasone (FLOVENT HFA) 220 MCG/ACT inhaler Inhale 1 puff into the lungs 2 (two) times daily.    [provider]  fluvoxaMINE (LUVOX) 100 MG tablet Take 1 tablet (100 mg total) by mouth at bedtime. 09/18/15   Pucilowska, Jolanta B, MD  ibuprofen  (ADVIL,MOTRIN) 800 MG tablet Take 1 tablet by mouth every 6 (six) hours as needed.    [provider]  lamoTRIgine (LAMICTAL) 25 MG tablet Take 2 tablets by mouth daily. 09/29/16 11/10/16  [provider]  lisdexamfetamine (VYVANSE) 50 MG capsule Take 50 mg by mouth daily.    [provider]  lurasidone (LATUDA) 20 MG TABS tablet Take 20 mg by mouth daily.    [provider]  oseltamivir (TAMIFLU) 75 MG capsule Take 1 capsule (75 mg total) by mouth 2 (two) times daily for 5 days. 03/27/17 04/01/17  Orvil Feil, PA-C  Oxcarbazepine (TRILEPTAL) 300 MG tablet Take 1 tablet (300 mg total) by mouth 2 (two) times daily. 09/18/15   Pucilowska, Braulio Conte B, MD  traZODone (DESYREL) 100 MG tablet Take 150 mg by mouth at bedtime.    [provider]    Allergies Patient has no known allergies.  No family history on file.  Social History Social History   Tobacco Use  . Smoking status: Current Every Day Smoker    Packs/day: 0.50    Types: Cigarettes  . Smokeless tobacco: Never Used  Substance Use Topics  . Alcohol use: No  . Drug use: No     Review of Systems  Constitutional: Patient has fever.  Eyes: No visual changes. No discharge ENT: Patient has congestion.  Cardiovascular: no chest pain. Respiratory: Patient has cough.  Gastrointestinal: No abdominal pain.  No nausea, no vomiting. Patient had diarrhea.  Genitourinary: Negative for dysuria. No hematuria Musculoskeletal: Patient has myalgias.  Skin: Negative  for rash, abrasions, lacerations, ecchymosis. Neurological: Patient has headache, no focal weakness or numbness.     ____________________________________________   PHYSICAL EXAM:  VITAL SIGNS: ED Triage Vitals  Enc Vitals Group     BP 03/27/17 1759 118/76     Pulse Rate 03/27/17 1759 97     Resp 03/27/17 1759 18     Temp 03/27/17 1759 99.2 F (37.3 C)     Temp Source 03/27/17 1759 Oral     SpO2 03/27/17 1759 98 %     Weight  03/27/17 1805 160 lb (72.6 kg)     Height --      Head Circumference --      Peak Flow --      Pain Score 03/27/17 1804 10     Pain Loc --      Pain Edu? --      Excl. in GC? --     Constitutional: Alert and oriented. Patient is lying supine. Eyes: Conjunctivae are normal. PERRL. EOMI. Head: Atraumatic. ENT:      Ears: Tympanic membranes are mildly injected with mild effusion bilaterally.       Nose: No congestion/rhinnorhea.      Mouth/Throat: Mucous membranes are moist. Posterior pharynx is mildly erythematous.  Hematological/Lymphatic/Immunilogical: No cervical lymphadenopathy.  Cardiovascular: Normal rate, regular rhythm. Normal S1 and S2.  Good peripheral circulation. Respiratory: Normal respiratory effort without tachypnea or retractions. Lungs CTAB. Good air entry to the bases with no decreased or absent breath sounds. Gastrointestinal: Bowel sounds 4 quadrants. Soft and nontender to palpation. No guarding or rigidity. No palpable masses. No distention. No CVA tenderness. Musculoskeletal: Full range of motion to all extremities. No gross deformities appreciated. Neurologic:  Normal speech and language. No gross focal neurologic deficits are appreciated.  Skin:  Skin is warm, dry and intact. No rash noted. Psychiatric: Mood and affect are normal. Speech and behavior are normal. Patient exhibits appropriate insight and judgement.    ____________________________________________   LABS (all labs ordered are listed, but only abnormal results are displayed)  Labs Reviewed - No data to display ____________________________________________  EKG   ____________________________________________  RADIOLOGY  No results found.  ____________________________________________    PROCEDURES  Procedure(s) performed:    Procedures    Medications - No data to display   ____________________________________________   INITIAL IMPRESSION / ASSESSMENT AND PLAN / ED  COURSE  Pertinent labs & imaging results that were available during my care of the patient were reviewed by me and considered in my medical decision making (see chart for details).  Review of the [place] CSRS was performed in accordance of the [facility] prior to dispensing any controlled drugs.    Assessment and Plan:  Influenza Like Illness:  Patient presents to the emergency department with rhinorrhea, congestion, nonproductive cough and headache for the past day.  History and physical exam findings are consistent with an influenza-like illness.  Patient was discharged with Tamiflu and supportive measures were encouraged.  Patient was advised to follow-up with primary care as needed.  All patient questions were answered.   ____________________________________________  FINAL CLINICAL IMPRESSION(S) / ED DIAGNOSES  Final diagnoses:  Influenza-like illness      NEW MEDICATIONS STARTED DURING THIS VISIT:  ED Discharge Orders        Ordered    oseltamivir (TAMIFLU) 75 MG capsule  2 times daily     03/27/17 1834          This chart was dictated using voice recognition software/Dragon. Despite  best efforts to proofread, errors can occur which can change the meaning. Any change was purely unintentional.    Orvil FeilWoods, Xavious Sharrar M, PA-C 03/27/17 Weldon [id number]    Williams, Jonathan E, MD 03/27/17 [phone]

## 2017-03-27 NOTE — ED Notes (Addendum)
Saw patient walking down hall after discharge, inquired about who patient is living with. Called caregiver, Clinton SawyerByron White Pt caregiver, Clinton SawyerByron White contacted and states that he is in parking lot. Requested that patient meet him in lobby. Patient walking out lobby, will meet him in lobby. First nurse aware.   Contacted LaytonByron White, has patient in car and is on way to pharmacy to get medication filled.

## 2017-03-27 NOTE — ED Notes (Addendum)
Guardian, Danae OrleansSusanne Lester informed about patient discharge and that patient leaving with Mr. Cliffton AstersWhite. Guardian approves of this.

## 2017-03-30 ENCOUNTER — Emergency Department: Payer: Medicaid Other

## 2017-03-30 ENCOUNTER — Emergency Department
Admission: EM | Admit: 2017-03-30 | Discharge: 2017-03-30 | Disposition: A | Discharge status: Home / Self Care | Payer: Medicaid Other | Attending: Emergency Medicine | Admitting: Emergency Medicine

## 2017-03-30 DIAGNOSIS — F17210 Nicotine dependence, cigarettes, uncomplicated: Secondary | ICD-10-CM | POA: Diagnosis not present

## 2017-03-30 DIAGNOSIS — H9202 Otalgia, left ear: Secondary | ICD-10-CM

## 2017-03-30 DIAGNOSIS — H66002 Acute suppurative otitis media without spontaneous rupture of ear drum, left ear: Secondary | ICD-10-CM | POA: Insufficient documentation

## 2017-03-30 DIAGNOSIS — Z79899 Other long term (current) drug therapy: Secondary | ICD-10-CM | POA: Diagnosis not present

## 2017-03-30 DIAGNOSIS — J45909 Unspecified asthma, uncomplicated: Secondary | ICD-10-CM | POA: Insufficient documentation

## 2017-03-30 MED ORDER — AMOXICILLIN 500 MG PO CAPS
500.0000 mg | ORAL_CAPSULE | Freq: Once | ORAL | Status: AC
  Administered 2017-03-30: 500 mg via ORAL
  Filled 2017-03-30: qty 1

## 2017-03-30 MED ORDER — AMOXICILLIN 500 MG PO CAPS: 500.0000 mg | ORAL_CAPSULE | Freq: Three times a day (TID) | ORAL | 0 refills | Status: DC

## 2017-03-30 MED ORDER — ONDANSETRON 4 MG PO TBDP
4.0000 mg | ORAL_TABLET | Freq: Once | ORAL | Status: AC
  Administered 2017-03-30: 4 mg via ORAL
  Filled 2017-03-30: qty 1

## 2017-03-30 MED ORDER — LIDOCAINE HCL (PF) 1 % IJ SOLN
2.0000 mL | Freq: Once | INTRAMUSCULAR | Status: AC
  Administered 2017-03-30: 2 mL
  Filled 2017-03-30: qty 5

## 2017-03-30 MED ORDER — IBUPROFEN 600 MG PO TABS
600.0000 mg | ORAL_TABLET | Freq: Once | ORAL | Status: AC
Start: 2017-03-30 — End: 2017-03-30
  Administered 2017-03-30: 600 mg via ORAL
  Filled 2017-03-30: qty 1

## 2017-03-30 NOTE — ED Notes (Signed)
Upon further discussion with pt about SI intent, pt denied SI ideation but reports that he is feeling extremely frustrated with his left ear pain at this time. Pt denies any thought to this RN of SI ideation or intent. MD Dolores FrameSung made aware and this RN will continue to monitor.

## 2017-03-30 NOTE — ED Notes (Signed)
Pt's legal guardian here to sign pt out at this time.

## 2017-03-30 NOTE — ED Provider Notes (Signed)
[facility] Emergency Department Provider Note   ____________________________________________   First MD Initiated Contact with Patient 03/30/17 0104     (approximate)  I have reviewed the triage vital signs and the nursing notes.   HISTORY  Chief Complaint Other (Seizure Like Activity) and Otalgia    HPI Joel Arroyo is a 22 y.o. male sent to the ED via EMS by group home for seizure-like activity.  Patient has a history of seizures, bipolar disorder who was recently diagnosed with influenza and is currently taking Tamiflu.  Patient complains of dry cough and left ear pain.  States the pain in his left ear was so severe that he laid down on the floor.  EMS reports group home staff told them they did not believe patient was having true seizure activity.  Along the way, patient mentioned to nursing staff that he was having vague SI without plan.  Tells both myself and primary nurse that he made that statement out of frustration secondary to the pain in his ear.  Denies active SI/HI/AH/VH.  Denies associated fever, chills, chest pain, shortness of breath, abdominal pain, vomiting.  Having some nausea.  Denies recent travel or trauma.   Past Medical History:  Diagnosis Date  . Asthma   . Bipolar 1 disorder (HCC)   . Seizures [facility])     Patient Active Problem List   Diagnosis Date Noted  . Seizure-like activity (HCC)   . Psychosis (HCC)   . Seizures (HCC) 09/06/2016  . Bipolar 2 disorder, major depressive episode (HCC) 09/15/2015  . Suicidal ideation 09/15/2015  . Homicidal ideation 09/15/2015  . ADHD (attention deficit hyperactivity disorder) 09/14/2015  . Involuntary commitment 09/14/2015    Past Surgical History:  Procedure Laterality Date  . HERNIA REPAIR      Prior to Admission medications   Medication Sig Start Date End Date Taking? Authorizing Provider  Albuterol Sulfate 108 (90 Base) MCG/ACT AEPB Inhale 1 puff into the lungs every 4  (four) hours as needed.    [provider]  clindamycin-benzoyl peroxide (BENZACLIN) gel Apply 1 application topically 2 (two) times daily.    [provider]  diazepam (DIASTAT ACUDIAL) 10 MG GEL Place 10 mg rectally once. 09/14/16 10/11/16  Sharman Cheek, MD  fluticasone (FLOVENT HFA) 220 MCG/ACT inhaler Inhale 1 puff into the lungs 2 (two) times daily.    [provider]  fluvoxaMINE (LUVOX) 100 MG tablet Take 1 tablet (100 mg total) by mouth at bedtime. 09/18/15   Pucilowska, Jolanta B, MD  ibuprofen (ADVIL,MOTRIN) 800 MG tablet Take 1 tablet by mouth every 6 (six) hours as needed.    [provider]  lamoTRIgine (LAMICTAL) 25 MG tablet Take 2 tablets by mouth daily. 09/29/16 11/10/16  [provider]  lisdexamfetamine (VYVANSE) 50 MG capsule Take 50 mg by mouth daily.    [provider]  lurasidone (LATUDA) 20 MG TABS tablet Take 20 mg by mouth daily.    [provider]  oseltamivir (TAMIFLU) 75 MG capsule Take 1 capsule (75 mg total) by mouth 2 (two) times daily for 5 days. 03/27/17 04/01/17  Orvil Feil, PA-C  Oxcarbazepine (TRILEPTAL) 300 MG tablet Take 1 tablet (300 mg total) by mouth 2 (two) times daily. 09/18/15   Pucilowska, Braulio Conte B, MD  traZODone (DESYREL) 100 MG tablet Take 150 mg by mouth at bedtime.    [provider]    Allergies Patient has no known allergies.  No family history on  file.  Social History Social History   Tobacco Use  . Smoking status: Current Every Day Smoker    Packs/day: 0.50    Types: Cigarettes  . Smokeless tobacco: Never Used  Substance Use Topics  . Alcohol use: No  . Drug use: No    Review of Systems  Constitutional: No fever/chills. Eyes: No visual changes. ENT: Positive for left ear pain.  No sore throat. Cardiovascular: Denies chest pain. Respiratory: Positive for dry cough.  Denies shortness of breath. Gastrointestinal: No abdominal pain.  No nausea, no vomiting.   No diarrhea.  No constipation. Genitourinary: Negative for dysuria. Musculoskeletal: Negative for back pain. Skin: Negative for rash. Neurological: Negative for seizure.  Negative for headaches, focal weakness or numbness. Psychiatric:Negative for SI/HI/AH/VH.  ____________________________________________   PHYSICAL EXAM:  VITAL SIGNS: ED Triage Vitals [03/30/17 0058]  Enc Vitals Group     BP 118/88     Pulse Rate 86     Resp 18     Temp 98.1 F (36.7 C)     Temp Source Oral     SpO2 100 %     Weight 160 lb (72.6 kg)     Height      Head Circumference      Peak Flow      Pain Score 10     Pain Loc      Pain Edu?      Excl. in GC?     Constitutional: Alert and oriented. Well appearing and in no acute distress. Eyes: Conjunctivae are normal. PERRL. EOMI. Head: Atraumatic. Ears: Right TM within normal limits.  Left TM erythematous, bulging without rupture. Nose: Congestion/rhinnorhea. Mouth/Throat: Mucous membranes are moist.  Oropharynx mildly erythematous without tonsillar swelling, exudates or peritonsillar abscess.  There is no hoarse or muffled voice.  There is no drooling. Neck: No stridor.  Supple neck without meningismus. Hematological/Lymphatic/Immunilogical: No cervical lymphadenopathy. Cardiovascular: Normal rate, regular rhythm. Grossly normal heart sounds.  Good peripheral circulation. Respiratory: Normal respiratory effort.  No retractions. Lungs CTAB. Gastrointestinal: Soft and nontender. No distention. No abdominal bruits. No CVA tenderness. Musculoskeletal: No lower extremity tenderness nor edema.  No joint effusions. Neurologic:  Normal speech and language. No gross focal neurologic deficits are appreciated. No gait instability. Skin:  Skin is warm, dry and intact. No rash noted.  No petechiae. Psychiatric: Mood and affect are normal. Speech and behavior are normal.  ____________________________________________   LABS (all labs ordered are listed,  but only abnormal results are displayed)  Labs Reviewed - No data to display ____________________________________________  EKG  ED ECG REPORT I, Grisell Bissette J, the attending physician, personally viewed and interpreted this ECG.   Date: 03/30/2017  EKG Time: 0121  Rate: 83  Rhythm: normal EKG, normal sinus rhythm  Axis: Normal  Intervals:none  ST&T Change: Nonspecific; appearance of EKG does not suggest global ST elevation indicative of acute pericarditis as the computer analysis indicates  ____________________________________________  RADIOLOGY  Dg Chest Port 1 View  Result Date: 03/30/2017 CLINICAL DATA:  Flu like symptoms and cough EXAM: PORTABLE CHEST 1 VIEW COMPARISON:  Report from 01/23/2000 FINDINGS: The heart size and mediastinal contours are within normal limits. Both lungs are clear. The visualized skeletal structures are unremarkable. IMPRESSION: No active disease. Electronically Signed   By: Tollie Ethavid  Kwon M.D.   On: 03/30/2017 02:10    ____________________________________________   PROCEDURES  Procedure(s) performed: None  Procedures  Critical Care performed: No  ____________________________________________   INITIAL IMPRESSION / ASSESSMENT AND PLAN /  ED COURSE  As part of my medical decision making, I reviewed the following data within the electronic MEDICAL RECORD NUMBER Nursing notes reviewed and incorporated, EKG interpreted, Old chart reviewed and Notes from prior ED visits.   22 year old male currently on Tamiflu for influenza who presents with dry cough and left earache.  Chest x-ray is negative for the left otitis media noted.  Will apply lidocaine drops for pain, start amoxicillin and patient will follow up with his PCP this week.  Strict return precautions given.  Patient verbalizes understanding and agrees with plan of care.      ____________________________________________   FINAL CLINICAL IMPRESSION(S) / ED DIAGNOSES  Final diagnoses:  Left ear  pain  Acute suppurative otitis media of left ear without spontaneous rupture of tympanic membrane, recurrence not specified     ED Discharge Orders    None       Note:  This document was prepared using Dragon voice recognition software and may include unintentional dictation errors.    Irean Hong, MD 03/30/17 [id number]

## 2017-03-30 NOTE — ED Triage Notes (Signed)
Patient coming [facility] from group home [address]   Patient diagnosed with influenza on Friday.  Per group home representative (via EMS report), patient found on floor shaking. Patient's group rep does not believe patient was seizing, however, sent patient out for medical clearance.

## 2017-03-30 NOTE — Discharge Instructions (Signed)
1.  Continue and finish Tamiflu as previously prescribed. 2.  Take antibiotic as prescribed (amoxicillin 500 mg 3 times daily x 7 days). 3.  You may take Tylenol and/or ibuprofen as needed for discomfort. 4.  Return to the ER for worsening symptoms, persistent vomiting, difficulty breathing or other concerns.

## 2017-04-24 ENCOUNTER — Encounter: Payer: Self-pay | Admitting: Emergency Medicine

## 2017-04-24 ENCOUNTER — Other Ambulatory Visit: Payer: Self-pay

## 2017-04-24 ENCOUNTER — Emergency Department
Admission: EM | Admit: 2017-04-24 | Discharge: 2017-04-24 | Disposition: A | Discharge status: Home / Self Care | Payer: Medicaid Other | Attending: Emergency Medicine | Admitting: Emergency Medicine

## 2017-04-24 DIAGNOSIS — Z79899 Other long term (current) drug therapy: Secondary | ICD-10-CM | POA: Insufficient documentation

## 2017-04-24 DIAGNOSIS — F17210 Nicotine dependence, cigarettes, uncomplicated: Secondary | ICD-10-CM | POA: Diagnosis not present

## 2017-04-24 DIAGNOSIS — J45909 Unspecified asthma, uncomplicated: Secondary | ICD-10-CM | POA: Diagnosis not present

## 2017-04-24 DIAGNOSIS — R569 Unspecified convulsions: Secondary | ICD-10-CM | POA: Diagnosis present

## 2017-04-24 LAB — BASIC METABOLIC PANEL
Anion gap: 8 (ref 5–15)
BUN: 14 mg/dL (ref 6–20)
CHLORIDE: 105 mmol/L (ref 101–111)
CO2: 26 mmol/L (ref 22–32)
Calcium: 9.6 mg/dL (ref 8.9–10.3)
Creatinine, Ser: 0.77 mg/dL (ref 0.61–1.24)
Glucose, Bld: 86 mg/dL (ref 65–99)
Potassium: 3.9 mmol/L (ref 3.5–5.1)
SODIUM: 139 mmol/L (ref 135–145)

## 2017-04-24 LAB — CBC WITH DIFFERENTIAL/PLATELET
BASOS ABS: 0 10*3/uL (ref 0–0.1)
Basophils Relative: 0 %
EOS ABS: 0.2 10*3/uL (ref 0–0.7)
Eosinophils Relative: 4 %
HCT: 46.2 % (ref 40.0–52.0)
HEMOGLOBIN: 15.5 g/dL (ref 13.0–18.0)
LYMPHS ABS: 1.7 10*3/uL (ref 1.0–3.6)
Lymphocytes Relative: 29 %
MCH: 32 pg (ref 26.0–34.0)
MCHC: 33.6 g/dL (ref 32.0–36.0)
MCV: 95.4 fL (ref 80.0–100.0)
Monocytes Absolute: 0.7 10*3/uL (ref 0.2–1.0)
Monocytes Relative: 12 %
NEUTROS PCT: 55 %
Neutro Abs: 3.1 10*3/uL (ref 1.4–6.5)
PLATELETS: 239 10*3/uL (ref 150–440)
RBC: 4.84 MIL/uL (ref 4.40–5.90)
RDW: 12.8 % (ref 11.5–14.5)
WBC: 5.7 10*3/uL (ref 3.8–10.6)

## 2017-04-24 LAB — CARBAMAZEPINE LEVEL, TOTAL

## 2017-04-24 LAB — GLUCOSE, CAPILLARY: GLUCOSE-CAPILLARY: 74 mg/dL (ref 65–99)

## 2017-04-24 NOTE — ED Notes (Signed)
AAOx3.  Skin warm and dry.  NAD 

## 2017-04-24 NOTE — ED Triage Notes (Signed)
[facility]. EMS reports that pt is new to a group home has been there for 14 days and they have been called out for 7 times all on days that he has group therapy. EMS reports that pt was found having "seizure movements" with his head on a pillow, did not lose his bowel or bladder. He is alert and oriented at this time. Pt reports that he thinks that he got up to quickly.

## 2017-04-24 NOTE — ED Provider Notes (Signed)
[facility] Emergency Department Provider Note  ____________________________________________   First MD Initiated Contact with Patient 04/24/17 [phone]     (approximate)  I have reviewed the triage vital signs and the nursing notes.   HISTORY  Chief Complaint Seizures   HPI Joel MESSER is a 22 y.o. male with a history of bipolar disorder, asthma and pseudoseizures who is presenting to the emergency department today after likely seizure.  EMS reports the patient was found on the floor in his room this morning and thought to have had a seizure.  They did not report confusion.  The patient says that he believes that he did have a seizure and that he felt very lightheaded and dizzy after standing up this morning and he believes that may have provoked the seizure/loss of consciousness.  He says that he has been feeling dizzy/lightheaded over the past 2 months.  Says that he also has the seizures when he is very stressed and says that he had multiple group meetings today which could have precipitated the seizure activity today.  He is denying any pain at this time.  Denies any chest pain or palpitations.  Denies any drinking or drug use.  Says that he has been compliant with his medications.  No seizure was witnessed today.     Past Medical History:  Diagnosis Date  . Asthma   . Bipolar 1 disorder (HCC)   . Seizures [facility])     Patient Active Problem List   Diagnosis Date Noted  . Seizure-like activity (HCC)   . Psychosis (HCC)   . Seizures (HCC) 09/06/2016  . Bipolar 2 disorder, major depressive episode (HCC) 09/15/2015  . Suicidal ideation 09/15/2015  . Homicidal ideation 09/15/2015  . ADHD (attention deficit hyperactivity disorder) 09/14/2015  . Involuntary commitment 09/14/2015    Past Surgical History:  Procedure Laterality Date  . HERNIA REPAIR      Prior to Admission medications   Medication Sig Start Date End Date Taking? Authorizing  Provider  Albuterol Sulfate 108 (90 Base) MCG/ACT AEPB Inhale 1 puff into the lungs every 4 (four) hours as needed.   Yes [provider]  clindamycin-benzoyl peroxide (BENZACLIN) gel Apply 1 application topically 2 (two) times daily.   Yes [provider]  fluvoxaMINE (LUVOX) 100 MG tablet Take 1 tablet (100 mg total) by mouth at bedtime. 09/18/15  Yes Pucilowska, Jolanta B, MD  ibuprofen (ADVIL,MOTRIN) 800 MG tablet Take 1 tablet by mouth every 6 (six) hours as needed.   Yes [provider]  lamoTRIgine (LAMICTAL) 25 MG tablet Take 2 tablets by mouth daily. 09/29/16 04/24/17 Yes [provider]  lisdexamfetamine (VYVANSE) 50 MG capsule Take 50 mg by mouth daily.   Yes [provider]  lurasidone (LATUDA) 20 MG TABS tablet Take 20 mg by mouth daily.   Yes [provider]  Oxcarbazepine (TRILEPTAL) 300 MG tablet Take 1 tablet (300 mg total) by mouth 2 (two) times daily. 09/18/15  Yes Pucilowska, Jolanta B, MD  amoxicillin (AMOXIL) 500 MG capsule Take 1 capsule (500 mg total) by mouth 3 (three) times daily. Patient not taking: Reported on 04/24/2017 03/30/17   Irean Hong, MD  diazepam (DIASTAT ACUDIAL) 10 MG GEL Place 10 mg rectally once. Patient not taking: Reported on 04/24/2017 09/14/16 10/11/16  Sharman Cheek, MD    Allergies Patient has no known allergies.  History reviewed. No pertinent family history.  Social History Social History   Tobacco Use  . Smoking  status: Current Every Day Smoker    Packs/day: 0.50    Types: Cigarettes  . Smokeless tobacco: Never Used  Substance Use Topics  . Alcohol use: No  . Drug use: No    Review of Systems  Constitutional: No fever/chills Eyes: No visual changes. ENT: No sore throat. Cardiovascular: Denies chest pain. Respiratory: Denies shortness of breath. Gastrointestinal: No abdominal pain.  No nausea, no vomiting.  No diarrhea.  No constipation. Genitourinary: Negative for  dysuria. Musculoskeletal: Negative for back pain. Skin: Negative for rash. Neurological: Negative for headaches, focal weakness or numbness.   ____________________________________________   PHYSICAL EXAM:  VITAL SIGNS: ED Triage Vitals  Enc Vitals Group     BP      Pulse      Resp      Temp      Temp src      SpO2      Weight      Height      Head Circumference      Peak Flow      Pain Score      Pain Loc      Pain Edu?      Excl. in GC?     Constitutional: Alert and oriented. Well appearing and in no acute distress. Eyes: Conjunctivae are normal.  Head: Atraumatic. Nose: No congestion/rhinnorhea. Mouth/Throat: Mucous membranes are moist.  No tongue bite Neck: No stridor.   Cardiovascular: Normal rate, regular rhythm. Grossly normal heart sounds.  Respiratory: Normal respiratory effort.  No retractions. Lungs CTAB. Gastrointestinal: Soft and nontender. No distention.  Musculoskeletal: No lower extremity tenderness nor edema.  No joint effusions. Neurologic:  Normal speech and language. No gross focal neurologic deficits are appreciated. Skin:  Skin is warm, dry and intact. No rash noted. Psychiatric: Mood and affect are normal. Speech and behavior are normal.  ____________________________________________   LABS (all labs ordered are listed, but only abnormal results are displayed)  Labs Reviewed  CARBAMAZEPINE LEVEL, TOTAL - Abnormal; Notable for the following components:      Result Value   Carbamazepine Lvl <2.0 (*)    All other components within normal limits  CBC WITH DIFFERENTIAL/PLATELET  BASIC METABOLIC PANEL  GLUCOSE, CAPILLARY   ____________________________________________  EKG  ED ECG REPORT I, Arelia Longest, the attending physician, personally viewed and interpreted this ECG.   Date: 04/24/2017  EKG Time: 0748  Rate: 80  Rhythm: normal sinus rhythm  Axis: Normal  Intervals:none  ST&T Change: ST elevations which are minimal and  diffuse likely from early repolarization.  Similar pattern seen on previous EKGs.    ____________________________________________  RADIOLOGY   ____________________________________________   PROCEDURES  Procedure(s) performed:   Procedures  Critical Care performed:   ____________________________________________   INITIAL IMPRESSION / ASSESSMENT AND PLAN / ED COURSE  Pertinent labs & imaging results that were available during my care of the patient were reviewed by me and considered in my medical decision making (see chart for details).  DDX: Seizure, syncope, pseudoseizure, electrolyte abnormality, anemia As part of my medical decision making, I reviewed the following data within the electronic MEDICAL RECORD NUMBER Notes from prior ED visits  Multiple visits for pseudoseizures/seizures likely to pass.  No recent lab work and patient says that he has been dizzy now over the past 2 months.  Will check basic labs to screen for any other abnormality such as anemia considering the dizziness.  However, even though the patient says that he has been dizzy over  the past 2 months he says that before his seizures that he does get this dizzy sensation typically.  ----------------------------------------- 9:19 AM on 04/24/2017 -----------------------------------------  Patient with very reassuring blood work.  Resting comfortably at this time.  Patient will be discharged.  We will give neurology follow-up.  We will continue to follow psychiatry.  Patient aware of lab results as well as treatment plan willing to comply. ____________________________________________   FINAL CLINICAL IMPRESSION(S) / ED DIAGNOSES  Seizure.    NEW MEDICATIONS STARTED DURING THIS VISIT:  New Prescriptions   No medications on file     Note:  This document was prepared using Dragon voice recognition software and may include unintentional dictation errors.     Myrna BlazerSchaevitz, Liberti Appleton Matthew, MD 04/24/17  [phone]

## 2017-04-24 NOTE — ED Notes (Signed)
Group home staff arrived to take patient home.  Patient discharged to Group Home.

## 2017-04-24 NOTE — ED Notes (Signed)
Spoke with Pollie MeyerShenille Moore from [facility] [phone]).  Informed of patient's order and informed that patient cannot be discharged to the lobby, a representative of the group home must be present to discharge patient home to.  Understanding verbalized.

## 2017-05-14 ENCOUNTER — Emergency Department
Admission: EM | Admit: 2017-05-14 | Discharge: 2017-05-14 | Disposition: A | Discharge status: Home / Self Care | Payer: Medicaid Other | Attending: Emergency Medicine | Admitting: Emergency Medicine

## 2017-05-14 ENCOUNTER — Other Ambulatory Visit: Payer: Self-pay

## 2017-05-14 ENCOUNTER — Encounter: Payer: Self-pay | Admitting: Emergency Medicine

## 2017-05-14 DIAGNOSIS — F17210 Nicotine dependence, cigarettes, uncomplicated: Secondary | ICD-10-CM | POA: Insufficient documentation

## 2017-05-14 DIAGNOSIS — J45909 Unspecified asthma, uncomplicated: Secondary | ICD-10-CM | POA: Insufficient documentation

## 2017-05-14 DIAGNOSIS — R569 Unspecified convulsions: Secondary | ICD-10-CM | POA: Diagnosis not present

## 2017-05-14 DIAGNOSIS — Z79899 Other long term (current) drug therapy: Secondary | ICD-10-CM | POA: Insufficient documentation

## 2017-05-14 DIAGNOSIS — F909 Attention-deficit hyperactivity disorder, unspecified type: Secondary | ICD-10-CM | POA: Insufficient documentation

## 2017-05-14 LAB — COMPREHENSIVE METABOLIC PANEL
ALBUMIN: 4.4 g/dL (ref 3.5–5.0)
ALK PHOS: 72 U/L (ref 38–126)
ALT: 34 U/L (ref 17–63)
AST: 39 U/L (ref 15–41)
Anion gap: 12 (ref 5–15)
BILIRUBIN TOTAL: 0.7 mg/dL (ref 0.3–1.2)
BUN: 12 mg/dL (ref 6–20)
CALCIUM: 9.1 mg/dL (ref 8.9–10.3)
CO2: 23 mmol/L (ref 22–32)
Chloride: 103 mmol/L (ref 101–111)
Creatinine, Ser: 0.93 mg/dL (ref 0.61–1.24)
GFR calc Af Amer: >60 mL/min (ref >60)
GFR calc non Af Amer: >60 mL/min (ref >60)
GLUCOSE: 93 mg/dL (ref 65–99)
Potassium: 4.2 mmol/L (ref 3.5–5.1)
SODIUM: 138 mmol/L (ref 135–145)
TOTAL PROTEIN: 7.6 g/dL (ref 6.5–8.1)

## 2017-05-14 LAB — CBC
HEMATOCRIT: 44.6 % (ref 40.0–52.0)
HEMOGLOBIN: 15.2 g/dL (ref 13.0–18.0)
MCH: 32.1 pg (ref 26.0–34.0)
MCHC: 34.1 g/dL (ref 32.0–36.0)
MCV: 94.2 fL (ref 80.0–100.0)
Platelets: 272 10*3/uL (ref 150–440)
RBC: 4.73 MIL/uL (ref 4.40–5.90)
RDW: 13.3 % (ref 11.5–14.5)
WBC: 8.3 10*3/uL (ref 3.8–10.6)

## 2017-05-14 NOTE — ED Triage Notes (Signed)
Patient to ED via [facility]. Per EMS, patient had witnessed seizure and was post-ictal upon their arrival. Patient reports history of seizure. Patient alert and oriented upon arrival with no memory of seizure. Reports pain all over.

## 2017-05-14 NOTE — ED Notes (Signed)
Called and spoke with Joel Arroyo, patient's legal guardian who gave consent for treatment. States she will call back with any further questions.

## 2017-05-14 NOTE — ED Provider Notes (Signed)
[facility] Emergency Department Provider Note   ____________________________________________    I have reviewed the triage vital signs and the nursing notes.   HISTORY  Chief Complaint Seizures     HPI Joel Arroyo is a 22 y.o. male who presents for reported seizure-like activity.  Review of records demonstrates multiple visits to the ED for similar complaints.  Patient has admitted before that he "fakes seizures"for multiple reasons including when he gets stressed and to make people leave him alone.  Currently he feels well and has no complaints.  No headache, no neuro deficits.  No nausea or vomiting.  Reports compliance with his medications.   Past Medical History:  Diagnosis Date  . Asthma   . Bipolar 1 disorder (HCC)   . Seizures [facility](HCC)     Patient Active Problem List   Diagnosis Date Noted  . Seizure-like activity (HCC)   . Psychosis (HCC)   . Seizures (HCC) 09/06/2016  . Bipolar 2 disorder, major depressive episode (HCC) 09/15/2015  . Suicidal ideation 09/15/2015  . Homicidal ideation 09/15/2015  . ADHD (attention deficit hyperactivity disorder) 09/14/2015  . Involuntary commitment 09/14/2015    Past Surgical History:  Procedure Laterality Date  . HERNIA REPAIR      Prior to Admission medications   Medication Sig Start Date End Date Taking? Authorizing Provider  Albuterol Sulfate 108 (90 Base) MCG/ACT AEPB Inhale 1 puff into the lungs every 4 (four) hours as needed.   Yes [provider]  fluvoxaMINE (LUVOX) 100 MG tablet Take 1 tablet (100 mg total) by mouth at bedtime. 09/18/15  Yes Pucilowska, Jolanta B, MD  ibuprofen (ADVIL,MOTRIN) 800 MG tablet Take 1 tablet by mouth every 6 (six) hours as needed.   Yes [provider]  lisdexamfetamine (VYVANSE) 50 MG capsule Take 50 mg by mouth daily.   Yes [provider]  lurasidone (LATUDA) 20 MG TABS tablet Take 20 mg by mouth daily.   Yes [provider]  Oxcarbazepine (TRILEPTAL) 300 MG tablet Take 1 tablet (300 mg total) by mouth 2 (two) times daily. 09/18/15  Yes Pucilowska, Jolanta B, MD  amoxicillin (AMOXIL) 500 MG capsule Take 1 capsule (500 mg total) by mouth 3 (three) times daily. Patient not taking: Reported on 04/24/2017 03/30/17   Irean HongSung, Jade J, MD  diazepam (DIASTAT ACUDIAL) 10 MG GEL Place 10 mg rectally once. Patient not taking: Reported on 04/24/2017 09/14/16 10/11/16  Sharman CheekStafford, Phillip, MD     Allergies Patient has no known allergies.  No family history on file.  Social History Social History   Tobacco Use  . Smoking status: Current Every Day Smoker    Packs/day: 0.50    Types: Cigarettes  . Smokeless tobacco: Never Used  Substance Use Topics  . Alcohol use: No  . Drug use: No    Review of Systems  Constitutional: No fever/chills Eyes: No visual changes.  ENT: No tongue injury Cardiovascular: Denies chest pain. Respiratory: Denies shortness of breath. Gastrointestinal:  No nausea, no vomiting.   Genitourinary: Negative for dysuria. Musculoskeletal: Negative for joint pain Skin: Negative for abrasion or laceration Neurological: Negative for headaches    ____________________________________________   PHYSICAL EXAM:  VITAL SIGNS: ED Triage Vitals  Enc Vitals Group     BP 05/14/17 1002 122/80     Pulse Rate 05/14/17 1002 71     Resp 05/14/17 1002 20     Temp 05/14/17 1002 97.6 F (36.4 C)  Temp Source 05/14/17 1002 Oral     SpO2 05/14/17 1002 100 %     Weight 05/14/17 1003 72.6 kg (160 lb)     Height 05/14/17 1003 1.854 m (6\' 1" )     Head Circumference --      Peak Flow --      Pain Score 05/14/17 1003 10     Pain Loc --      Pain Edu? --      Excl. in GC? --     Constitutional: Alert and oriented.   Eyes: Conjunctivae are normal.  Head: Atraumatic. Nose: No congestion/rhinnorhea. Mouth/Throat: Mucous membranes are moist.  No tongue laceration Cardiovascular: Normal rate,  regular rhythm.   Good peripheral circulation. Respiratory: Normal respiratory effort.  No retractions. Gastrointestinal: Soft and nontender. No distention.  Genitourinary: deferred Musculoskeletal: .  Warm and well perfused Neurologic:  Normal speech and language. No gross focal neurologic deficits are appreciated.  Skin:  Skin is warm, dry and intact. No rash noted. Psychiatric: Mood and affect are normal. Speech and behavior are normal.  ____________________________________________   LABS (all labs ordered are listed, but only abnormal results are displayed)  Labs Reviewed  CBC  COMPREHENSIVE METABOLIC PANEL   ____________________________________________  EKG  ED ECG REPORT I, Jene Every, the attending physician, personally viewed and interpreted this ECG.  Date: 05/14/2017  Rhythm: normal sinus rhythm QRS Axis: normal Intervals: normal ST/T Wave abnormalities: normal Narrative Interpretation: no evidence of acute ischemia  ____________________________________________  RADIOLOGY  None ____________________________________________   PROCEDURES  Procedure(s) performed: No  Procedures   Critical Care performed: No ____________________________________________   INITIAL IMPRESSION / ASSESSMENT AND PLAN / ED COURSE  Pertinent labs & imaging results that were available during my care of the patient were reviewed by me and considered in my medical decision making (see chart for details).  Patient well-appearing in no acute distress.  Given history I suspect pseudoseizure but we will observe the emergency department, check labs and monitor.  ----------------------------------------- 11:52 AM on 05/14/2017 -----------------------------------------  Patient has admitted to the nurse that his seizure today was only so that he could leave the group home.  I discussed with him that this is certainly inappropriate and abuse of the EMS system  Lab work is  unremarkable.  No seizure-like activity in the emergency department.  Appropriate for discharge  Legal guardian notified by nurse   ____________________________________________   FINAL CLINICAL IMPRESSION(S) / ED DIAGNOSES  Final diagnoses:  Seizure-like activity (HCC)        Note:  This document was prepared using Dragon voice recognition software and may include unintentional dictation errors.    Jene Every, MD 05/14/17 1153

## 2017-05-14 NOTE — ED Notes (Signed)
Spoke with Clinton SawyerByron White from patient's group home who states he will be here in 10 minutes to pick up patient. Patient's guardian aware of patient's discharge.

## 2017-05-14 NOTE — ED Notes (Signed)
Patient ambulatory to lobby with NAD noted. Verbalized understanding of discharge instructions and follow-up care. 

## 2017-06-17 NOTE — Progress Notes (Signed)
06/18/2017 3:42 PM   Joel Arroyo 08/05/1995 [id number]  Referring provider: [name] [address] [place], [place] [phone]  Chief Complaint  Patient presents with  . New Patient (Initial Visit)    HPI: Patient is a 22 year old Caucasian male above his groin on a routine physical.  He states he has had one month of suprapubic pain.  He states that when he sits up the right groin a stabbing pain that has an intensity of 8 out of 10.  He states when he moves around the left groin experiences the stabbing pain and it reaches 9 out of 10.  He states he does have a dull pain continuously within the suprapubic region.  He states lying in the recumbent position helps the pain.  The sharp stabbing pain can last as long as 5 minutes at a time.  Patient denies any gross hematuria, dysuria or flank pain.  Patient denies any fevers, chills, nausea or vomiting.   He states that ever since he has had the hernia repair on the left, he has had nocturia every once and awhile and taking a long time to complete a void.  He denies straining to urinate a weak urinary stream hesitancy or spraying of his urinary stream.  His UA is negative.  His PVR is 23 mL.    He denies any swelling in the testicles.  He denies any changes in BM's.     PMH: Past Medical History:  Diagnosis Date  . Anxiety   . Asthma   . Bipolar 1 disorder (HCC)   . Depression   . Seizures [facility](HCC)     Surgical History: Past Surgical History:  Procedure Laterality Date  . HERNIA REPAIR      Home Medications:  Allergies as of 06/18/2017   No Known Allergies     Medication List        Accurate as of 06/18/17  3:42 PM. Always use your most recent med list.          Albuterol Sulfate 108 (90 Base) MCG/ACT Aepb Inhale 1 puff into the lungs every 4 (four) hours as needed.   amoxicillin 500 MG capsule Commonly known as:  AMOXIL Take 1 capsule (500 mg total) by mouth 3 (three) times daily.     diazepam 10 MG Gel Commonly known as:  DIASTAT ACUDIAL Place 10 mg rectally once.   fluvoxaMINE 100 MG tablet Commonly known as:  LUVOX Take 1 tablet (100 mg total) by mouth at bedtime.   ibuprofen 800 MG tablet Commonly known as:  ADVIL,MOTRIN Take 1 tablet by mouth every 6 (six) hours as needed.   LATUDA 20 MG Tabs tablet Generic drug:  lurasidone Take 20 mg by mouth daily.   lisdexamfetamine 50 MG capsule Commonly known as:  VYVANSE Take 50 mg by mouth daily.   Oxcarbazepine 300 MG tablet Commonly known as:  TRILEPTAL Take 1 tablet (300 mg total) by mouth 2 (two) times daily.       Allergies: No Known Allergies  Family History: Family History  Problem Relation Age of Onset  . Kidney cancer Neg Hx   . Kidney disease Neg Hx   . Prostate cancer Neg Hx     Social History:  reports that he has been smoking cigarettes.  He has been smoking about 0.50 packs per day. He has never used smokeless tobacco. He reports that he does not drink alcohol or use drugs.  ROS: UROLOGY Frequent Urination?: No Hard  to postpone urination?: No Burning/pain with urination?: No Get up at night to urinate?: Yes Leakage of urine?: No Urine stream starts and stops?: No Trouble starting stream?: No Do you have to strain to urinate?: No Blood in urine?: No Urinary tract infection?: No Sexually transmitted disease?: No Injury to kidneys or bladder?: No Painful intercourse?: No Weak stream?: No Erection problems?: No Penile pain?: No  Gastrointestinal Nausea?: No Vomiting?: Yes Indigestion/heartburn?: No Diarrhea?: No Constipation?: No  Constitutional Fever: No Night sweats?: No Weight loss?: No Fatigue?: No  Skin Skin rash/lesions?: No Itching?: No  Eyes Blurred vision?: Yes Double vision?: No  Ears/Nose/Throat Sore throat?: No Sinus problems?: No  Hematologic/Lymphatic Swollen glands?: No Easy bruising?: No  Cardiovascular Leg swelling?: No Chest pain?:  No  Respiratory Cough?: No Shortness of breath?: Yes  Endocrine Excessive thirst?: No  Musculoskeletal Back pain?: No Joint pain?: No  Neurological Headaches?: Yes Dizziness?: No  Psychologic Depression?: Yes Anxiety?: Yes  Physical Exam: BP 124/77   Pulse 77   Ht 6\' 1"  (1.854 m)   Wt 160 lb (72.6 kg)   BMI 21.11 kg/m   Constitutional: Well nourished. Alert and oriented, No acute distress. HEENT: [place] AT, moist mucus membranes. Trachea midline, no masses. Cardiovascular: No clubbing, cyanosis, or edema. Respiratory: Normal respiratory effort, no increased work of breathing. GI: Abdomen is soft, non tender, non distended, no abdominal masses. Liver and spleen not palpable.  No hernias appreciated.  Stool sample for occult testing is not indicated.   GU: No CVA tenderness.  No bladder fullness or masses.  Patient with circumcised phallus.  Urethral meatus is patent.  No penile discharge. No penile lesions or rashes. Scrotum without lesions, cysts, rashes and/or edema.  Testicles are located scrotally bilaterally. No masses are appreciated in the testicles. Left and right epididymis are normal.  Left groin area, a tender lump.  ? Mesh of hernia recurrence or hernia Rectal: Patient with  normal sphincter tone. Anus and perineum without scarring or rashes. No rectal masses are appreciated. Prostate is approximately 25 grams, no nodules are appreciated. Seminal vesicles are normal. Skin: No rashes, bruises or suspicious lesions. Lymph: No cervical or inguinal adenopathy. Neurologic: Grossly intact, no focal deficits, moving all 4 extremities. Psychiatric: Normal mood and affect.  Laboratory Data: Lab Results  Component Value Date   WBC 8.3 05/14/2017   HGB 15.2 05/14/2017   HCT 44.6 05/14/2017   MCV 94.2 05/14/2017   PLT 272 05/14/2017    Lab Results  Component Value Date   CREATININE 0.93 05/14/2017    No results found for: PSA  No results found for:  TESTOSTERONE  Lab Results  Component Value Date   HGBA1C 5.3 09/05/2016    Lab Results  Component Value Date   TSH 2.105 09/05/2016    No results found for: CHOL, HDL, CHOLHDL, VLDL, LDLCALC  Lab Results  Component Value Date   AST 39 05/14/2017   Lab Results  Component Value Date   ALT 34 05/14/2017   No components found for: ALKALINEPHOPHATASE No components found for: BILIRUBINTOTAL  No results found for: ESTRADIOL  Urinalysis Negative.  See Epic.   I have reviewed the labs.   Pertinent Imaging: Results for ALANDO, COLLERAN (MRN [id number]) as of 06/23/2017 11:51  Ref. Range 06/23/2017 11:51  Scan Result Unknown 23 mL     Assessment & Plan:    1. Suprapubic pain/inguinal lump ? Of mesh issues vs hernia recurrence Will obtain a contrast CT for further  evaluation  2. Nocturia Happens only occasionally and not bothersome to patient  3. Difficulty with micturition Patient's UA is negative and PVR is minimal - exam is normal He is not having symptoms of obstruction Will continue to monitor   Return for CT report .  These notes generated with voice recognition software. I apologize for typographical errors.  Michiel Cowboy, PA-C  [facility] [address] [place], [place] [address] [phone]

## 2017-06-18 ENCOUNTER — Encounter: Payer: Self-pay | Admitting: Urology

## 2017-06-18 ENCOUNTER — Ambulatory Visit (INDEPENDENT_AMBULATORY_CARE_PROVIDER_SITE_OTHER): Payer: Medicaid Other | Admitting: Urology

## 2017-06-18 VITALS — BP 124/77 | HR 77 | Ht 73.0 in | Wt 160.0 lb

## 2017-06-18 DIAGNOSIS — R102 Pelvic and perineal pain: Secondary | ICD-10-CM

## 2017-06-18 DIAGNOSIS — R222 Localized swelling, mass and lump, trunk: Secondary | ICD-10-CM | POA: Diagnosis not present

## 2017-06-18 DIAGNOSIS — R39198 Other difficulties with micturition: Secondary | ICD-10-CM | POA: Diagnosis not present

## 2017-06-18 DIAGNOSIS — R351 Nocturia: Secondary | ICD-10-CM

## 2017-06-18 LAB — MICROSCOPIC EXAMINATION

## 2017-06-18 LAB — URINALYSIS, COMPLETE
Bilirubin, UA: NEGATIVE
Glucose, UA: NEGATIVE
Ketones, UA: NEGATIVE
LEUKOCYTES UA: NEGATIVE
Nitrite, UA: NEGATIVE
PH UA: 6.5 (ref 5.0–7.5)
PROTEIN UA: NEGATIVE
RBC, UA: NEGATIVE
Specific Gravity, UA: 1.02 (ref 1.005–1.030)
Urobilinogen, Ur: 0.2 mg/dL (ref 0.2–1.0)

## 2017-06-23 ENCOUNTER — Telehealth: Payer: Self-pay | Admitting: Urology

## 2017-06-23 LAB — BLADDER SCAN AMB NON-IMAGING

## 2017-06-23 NOTE — Telephone Encounter (Signed)
Note completed 

## 2017-06-23 NOTE — Telephone Encounter (Signed)
I need to do a PA on this CT scan if you are able to finish his notes so that I can complete it please and thank you.  Marcelino DusterMichelle

## 2017-06-29 ENCOUNTER — Telehealth: Payer: Self-pay | Admitting: Urology

## 2017-06-29 ENCOUNTER — Other Ambulatory Visit: Payer: Self-pay | Admitting: Urology

## 2017-06-29 DIAGNOSIS — R599 Enlarged lymph nodes, unspecified: Secondary | ICD-10-CM

## 2017-06-29 NOTE — Progress Notes (Signed)
Orders for pelvic CT are in.

## 2017-06-29 NOTE — Telephone Encounter (Signed)
Insurance denied the CT abd/pelvis w/ But did approve the pelvis w/ Can you cancel the abd/pelvis and put another order in for the pelvis with?   Thanks, Marcelino DusterMichelle

## 2017-07-09 ENCOUNTER — Ambulatory Visit: Payer: Self-pay | Admitting: Urology

## 2017-07-27 ENCOUNTER — Ambulatory Visit
Admission: RE | Admit: 2017-07-27 | Discharge: 2017-07-27 | Disposition: A | Discharge status: Home / Self Care | Payer: Medicaid Other | Source: Ambulatory Visit | Attending: Urology | Admitting: Urology

## 2017-07-27 DIAGNOSIS — R599 Enlarged lymph nodes, unspecified: Secondary | ICD-10-CM

## 2017-07-28 ENCOUNTER — Ambulatory Visit
Admission: RE | Admit: 2017-07-28 | Discharge: 2017-07-28 | Disposition: A | Discharge status: Home / Self Care | Payer: Medicaid Other | Source: Ambulatory Visit | Attending: Urology | Admitting: Urology

## 2017-07-28 DIAGNOSIS — R599 Enlarged lymph nodes, unspecified: Secondary | ICD-10-CM | POA: Diagnosis not present

## 2017-07-28 MED ORDER — IOPAMIDOL (ISOVUE-300) INJECTION 61%
100.0000 mL | Freq: Once | INTRAVENOUS | Status: AC | PRN
  Administered 2017-07-28: 100 mL via INTRAVENOUS

## 2017-07-30 ENCOUNTER — Telehealth: Payer: Self-pay | Admitting: Family Medicine

## 2017-07-30 NOTE — Telephone Encounter (Signed)
LMOM for patient to return call.

## 2017-07-30 NOTE — Telephone Encounter (Signed)
-----   Message from Harle Battiest, PA-C sent at 07/30/2017 10:24 AM EDT ----- Please let Joel Arroyo know that his CT scan did not show any abnormalities or any reason for his pain.  He would benefit from a referral to PT for further evaluation and treatment.

## 2017-08-10 ENCOUNTER — Other Ambulatory Visit: Payer: Self-pay

## 2017-08-10 ENCOUNTER — Emergency Department
Admission: EM | Admit: 2017-08-10 | Discharge: 2017-08-10 | Disposition: A | Discharge status: Home / Self Care | Payer: Medicaid Other | Attending: Emergency Medicine | Admitting: Emergency Medicine

## 2017-08-10 DIAGNOSIS — F3131 Bipolar disorder, current episode depressed, mild: Secondary | ICD-10-CM | POA: Insufficient documentation

## 2017-08-10 DIAGNOSIS — J45909 Unspecified asthma, uncomplicated: Secondary | ICD-10-CM | POA: Diagnosis not present

## 2017-08-10 DIAGNOSIS — F17210 Nicotine dependence, cigarettes, uncomplicated: Secondary | ICD-10-CM | POA: Diagnosis not present

## 2017-08-10 DIAGNOSIS — R45851 Suicidal ideations: Secondary | ICD-10-CM | POA: Insufficient documentation

## 2017-08-10 DIAGNOSIS — Z79899 Other long term (current) drug therapy: Secondary | ICD-10-CM | POA: Insufficient documentation

## 2017-08-10 DIAGNOSIS — F329 Major depressive disorder, single episode, unspecified: Secondary | ICD-10-CM | POA: Diagnosis present

## 2017-08-10 LAB — CBC
HCT: 45.9 % (ref 40.0–52.0)
Hemoglobin: 16.2 g/dL (ref 13.0–18.0)
MCH: 33.2 pg (ref 26.0–34.0)
MCHC: 35.3 g/dL (ref 32.0–36.0)
MCV: 94.1 fL (ref 80.0–100.0)
PLATELETS: 289 10*3/uL (ref 150–440)
RBC: 4.88 MIL/uL (ref 4.40–5.90)
RDW: 12.8 % (ref 11.5–14.5)
WBC: 9.3 10*3/uL (ref 3.8–10.6)

## 2017-08-10 LAB — COMPREHENSIVE METABOLIC PANEL
ALBUMIN: 4.7 g/dL (ref 3.5–5.0)
ALK PHOS: 81 U/L (ref 38–126)
ALT: 24 U/L (ref 17–63)
AST: 30 U/L (ref 15–41)
Anion gap: 8 (ref 5–15)
BILIRUBIN TOTAL: 0.4 mg/dL (ref 0.3–1.2)
BUN: 19 mg/dL (ref 6–20)
CALCIUM: 9.3 mg/dL (ref 8.9–10.3)
CO2: 26 mmol/L (ref 22–32)
CREATININE: 0.94 mg/dL (ref 0.61–1.24)
Chloride: 104 mmol/L (ref 101–111)
GFR calc Af Amer: >60 mL/min (ref >60)
Glucose, Bld: 120 mg/dL — ABNORMAL HIGH (ref 65–99)
POTASSIUM: 3.8 mmol/L (ref 3.5–5.1)
Sodium: 138 mmol/L (ref 135–145)
TOTAL PROTEIN: 7.9 g/dL (ref 6.5–8.1)

## 2017-08-10 LAB — ETHANOL

## 2017-08-10 LAB — URINE DRUG SCREEN, QUALITATIVE (ARMC ONLY)
Amphetamines, Ur Screen: POSITIVE — AB
BENZODIAZEPINE, UR SCRN: NOT DETECTED
Barbiturates, Ur Screen: NOT DETECTED
CANNABINOID 50 NG, UR ~~LOC~~: NOT DETECTED
Cocaine Metabolite,Ur ~~LOC~~: NOT DETECTED
MDMA (Ecstasy)Ur Screen: NOT DETECTED
Methadone Scn, Ur: NOT DETECTED
OPIATE, UR SCREEN: NOT DETECTED
PHENCYCLIDINE (PCP) UR S: NOT DETECTED
Tricyclic, Ur Screen: NOT DETECTED

## 2017-08-10 LAB — ACETAMINOPHEN LEVEL: Acetaminophen (Tylenol), Serum: <10 ug/mL — ABNORMAL LOW (ref 10–30)

## 2017-08-10 LAB — SALICYLATE LEVEL: Salicylate Lvl: <7 mg/dL (ref 2.8–30.0)
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
## 2017-08-10 NOTE — ED Notes (Signed)
Hourly rounding reveals patient sleeping in room. No complaints, stable, in no acute distress. Q15 minute rounds and monitoring via Rover and Officer to continue.  

## 2017-08-10 NOTE — Discharge Instructions (Addendum)
You have been seen in the Emergency Department (ED)  today for a psychiatric complaint.  You have been evaluated by psychiatry and we believe you are safe to be discharged from the hospital.   ° °Please return to the Emergency Department (ED)  immediately if you have ANY thoughts of hurting yourself or anyone else, so that we may help you. ° °Please avoid alcohol and drug use. ° °Follow up with your doctor and/or therapist as soon as possible regarding today's ED  visit.  ° °You may call crisis hotline for [place] at [phone]. ° °

## 2017-08-10 NOTE — ED Notes (Signed)
SOC complete.  

## 2017-08-10 NOTE — ED Notes (Signed)
Report to include Situation, Background, Assessment, and Recommendations received from Amy B. RN. Patient alert and oriented, warm and dry, in no acute distress. Patient denies SI, HI, AVH and pain. Patient made aware of Q15 minute rounds and Rover and Officer presence for their safety. Patient instructed to come to me with needs or concerns.  

## 2017-08-10 NOTE — ED Notes (Signed)
Pt group home staff Gwen told to stay in lobby while patient is evaluated and seen as she is his caregiver currently.

## 2017-08-10 NOTE — ED Notes (Signed)
Hourly rounding reveals patient in room. No complaints, stable, in no acute distress. Q15 minute rounds and monitoring via Rover and Officer to continue.   

## 2017-08-10 NOTE — ED Notes (Signed)
Joel Arroyo, listed on call guardian at [phone] gives consent for treatment.

## 2017-08-10 NOTE — ED Notes (Signed)
After calling Gwen at [facility] Left a message for Legal Sherin Quarry at [phone].

## 2017-08-10 NOTE — ED Triage Notes (Addendum)
Suicidal thoughts x 1 week. Denies plan. Pt arrives with group home staff member.  Denies being actively suicidal at this time.   Phone number [employer] [phone]

## 2017-09-25 ENCOUNTER — Encounter: Payer: Self-pay | Admitting: Emergency Medicine

## 2017-09-25 ENCOUNTER — Other Ambulatory Visit: Payer: Self-pay

## 2017-09-25 ENCOUNTER — Emergency Department
Admission: EM | Admit: 2017-09-25 | Discharge: 2017-09-25 | Disposition: A | Discharge status: Home / Self Care | Payer: Medicaid Other | Attending: Emergency Medicine | Admitting: Emergency Medicine

## 2017-09-25 DIAGNOSIS — Z5321 Procedure and treatment not carried out due to patient leaving prior to being seen by health care provider: Secondary | ICD-10-CM | POA: Insufficient documentation

## 2017-09-25 DIAGNOSIS — R569 Unspecified convulsions: Secondary | ICD-10-CM | POA: Insufficient documentation

## 2017-09-25 LAB — BASIC METABOLIC PANEL
ANION GAP: 11 (ref 5–15)
BUN: 14 mg/dL (ref 6–20)
CO2: 25 mmol/L (ref 22–32)
Calcium: 9.1 mg/dL (ref 8.9–10.3)
Chloride: 103 mmol/L (ref 98–111)
Creatinine, Ser: 0.76 mg/dL (ref 0.61–1.24)
GFR calc Af Amer: >60 mL/min (ref >60)
GLUCOSE: 114 mg/dL — AB (ref 70–99)
POTASSIUM: 3.6 mmol/L (ref 3.5–5.1)
Sodium: 139 mmol/L (ref 135–145)

## 2017-09-25 LAB — CBC
HCT: 43.5 % (ref 40.0–52.0)
Hemoglobin: 15 g/dL (ref 13.0–18.0)
MCH: 33.1 pg (ref 26.0–34.0)
MCHC: 34.5 g/dL (ref 32.0–36.0)
MCV: 96.1 fL (ref 80.0–100.0)
PLATELETS: 250 10*3/uL (ref 150–440)
RBC: 4.53 MIL/uL (ref 4.40–5.90)
RDW: 13 % (ref 11.5–14.5)
WBC: 6.4 10*3/uL (ref 3.8–10.6)

## 2017-09-25 LAB — CARBAMAZEPINE LEVEL, TOTAL

## 2017-09-25 NOTE — ED Notes (Signed)
Patient states to this RN he is leaving due to an important appointment with a psychiatrist at 3pm.  This RN strongly encouraged patient to ED after seeing his psychiatrist.  This RN reminded patient he should not be driving with recent seizures.  Patient states he will not drive and he will come back to the ED.

## 2017-09-25 NOTE — ED Triage Notes (Signed)
Pt comes into the ED via POV where [facility] for having a witnessed seizure.  Patient was there with someone else who had a doctors appt and then he began seizing.  The patient is unsure of how long it was and no family is present at this time.  Patient is alert and oriented x4 and in NAD with even and unlabored respirations.  Patient has h/o seizures and he has bene taking his seizure medication like normal.

## 2017-09-25 NOTE — ED Notes (Signed)
Rounded with patient.  Discussed wait times.

## 2017-09-28 ENCOUNTER — Telehealth: Payer: Self-pay | Admitting: Emergency Medicine

## 2017-09-28 NOTE — Telephone Encounter (Signed)
Called patient due to lwot to inquire about condition and follow up plans. No answer on cell and home phone number is aplace he no longer lives.

## 2017-09-29 NOTE — Telephone Encounter (Signed)
Due to lwot on Friday, called guardian--which has changed to vince mcknight at [phone].  I explained about the lwot after a witnessed seizure.  I explained that his tegretol level was low, and he may or may not be taking--or not taking correctly.  He will notify patients worker about need for follow up for seizures.

## 2017-10-04 ENCOUNTER — Other Ambulatory Visit: Payer: Self-pay

## 2017-10-04 ENCOUNTER — Emergency Department
Admission: EM | Admit: 2017-10-04 | Discharge: 2017-10-04 | Disposition: A | Discharge status: Home / Self Care | Payer: Medicaid Other | Attending: Emergency Medicine | Admitting: Emergency Medicine

## 2017-10-04 DIAGNOSIS — F17210 Nicotine dependence, cigarettes, uncomplicated: Secondary | ICD-10-CM | POA: Diagnosis not present

## 2017-10-04 DIAGNOSIS — R569 Unspecified convulsions: Secondary | ICD-10-CM

## 2017-10-04 DIAGNOSIS — Z79899 Other long term (current) drug therapy: Secondary | ICD-10-CM | POA: Insufficient documentation

## 2017-10-04 DIAGNOSIS — F445 Conversion disorder with seizures or convulsions: Secondary | ICD-10-CM | POA: Diagnosis not present

## 2017-10-04 DIAGNOSIS — J45909 Unspecified asthma, uncomplicated: Secondary | ICD-10-CM | POA: Diagnosis not present

## 2017-10-04 LAB — CBC WITH DIFFERENTIAL/PLATELET
BASOS ABS: 0.1 10*3/uL (ref 0–0.1)
Basophils Relative: 1 %
Eosinophils Absolute: 0.5 10*3/uL (ref 0–0.7)
Eosinophils Relative: 6 %
HEMATOCRIT: 42.5 % (ref 40.0–52.0)
Hemoglobin: 14.9 g/dL (ref 13.0–18.0)
LYMPHS ABS: 2.4 10*3/uL (ref 1.0–3.6)
LYMPHS PCT: 26 %
MCH: 33.5 pg (ref 26.0–34.0)
MCHC: 34.9 g/dL (ref 32.0–36.0)
MCV: 96 fL (ref 80.0–100.0)
Monocytes Absolute: 1 10*3/uL (ref 0.2–1.0)
Monocytes Relative: 10 %
NEUTROS ABS: 5.2 10*3/uL (ref 1.4–6.5)
Neutrophils Relative %: 57 %
Platelets: 250 10*3/uL (ref 150–440)
RBC: 4.43 MIL/uL (ref 4.40–5.90)
RDW: 13.5 % (ref 11.5–14.5)
WBC: 9.3 10*3/uL (ref 3.8–10.6)

## 2017-10-04 LAB — URINALYSIS, ROUTINE W REFLEX MICROSCOPIC
Bilirubin Urine: NEGATIVE
GLUCOSE, UA: NEGATIVE mg/dL
Hgb urine dipstick: NEGATIVE
KETONES UR: NEGATIVE mg/dL
LEUKOCYTES UA: NEGATIVE
Nitrite: NEGATIVE
PH: 6 (ref 5.0–8.0)
Protein, ur: NEGATIVE mg/dL
SPECIFIC GRAVITY, URINE: 1.027 (ref 1.005–1.030)

## 2017-10-04 LAB — BASIC METABOLIC PANEL
ANION GAP: 8 (ref 5–15)
BUN: 14 mg/dL (ref 6–20)
CO2: 26 mmol/L (ref 22–32)
Calcium: 9.1 mg/dL (ref 8.9–10.3)
Chloride: 106 mmol/L (ref 98–111)
Creatinine, Ser: 0.88 mg/dL (ref 0.61–1.24)
GFR calc Af Amer: >60 mL/min (ref >60)
GFR calc non Af Amer: >60 mL/min (ref >60)
GLUCOSE: 99 mg/dL (ref 70–99)
POTASSIUM: 3.9 mmol/L (ref 3.5–5.1)
Sodium: 140 mmol/L (ref 135–145)

## 2017-10-04 NOTE — ED Provider Notes (Signed)
[facility] Emergency Department Provider Note   ____________________________________________   None    (approximate)  I have reviewed the triage vital signs and the nursing notes.   HISTORY  Chief Complaint Seizures    HPI Joel Arroyo is a 22 y.o. male who presents to the ED from home with a chief complaint of 3 seizure-like episodes today.  Patient has a history of bipolar disorder and pseudoseizures who is well-known to the emergency department.  States he is no longer living in a group home and is staying with his stepmother.  States he was upset this evening thinking about his girlfriend as he does not see her as much as he would like.  Denies active SI/HI/AH/VH.  Reports he is feeling much better now without seizure-like activity.  Denies recent fever, chills, chest pain, shortness of breath, abdominal pain, nausea, vomiting, diarrhea.  Denies recent travel or trauma.   Past Medical History:  Diagnosis Date  . Anxiety   . Asthma   . Bipolar 1 disorder (HCC)   . Depression   . Seizures [facility](HCC)     Patient Active Problem List   Diagnosis Date Noted  . Seizure-like activity (HCC)   . Psychosis (HCC)   . Seizures (HCC) 09/06/2016  . Bipolar 2 disorder, major depressive episode (HCC) 09/15/2015  . Suicidal ideation 09/15/2015  . Homicidal ideation 09/15/2015  . ADHD (attention deficit hyperactivity disorder) 09/14/2015  . Involuntary commitment 09/14/2015    Past Surgical History:  Procedure Laterality Date  . HERNIA REPAIR      Prior to Admission medications   Medication Sig Start Date End Date Taking? Authorizing Provider  Albuterol Sulfate 108 (90 Base) MCG/ACT AEPB Inhale 1 puff into the lungs every 4 (four) hours as needed.    [provider]  amoxicillin (AMOXIL) 500 MG capsule Take 1 capsule (500 mg total) by mouth 3 (three) times daily. Patient not taking: Reported on 04/24/2017 03/30/17   Irean HongSung, Zania Kalisz J, MD  diazepam  (DIASTAT ACUDIAL) 10 MG GEL Place 10 mg rectally once. Patient not taking: Reported on 04/24/2017 09/14/16 10/11/16  Sharman CheekStafford, Phillip, MD  fluvoxaMINE (LUVOX) 100 MG tablet Take 1 tablet (100 mg total) by mouth at bedtime. 09/18/15   Pucilowska, Jolanta B, MD  ibuprofen (ADVIL,MOTRIN) 800 MG tablet Take 1 tablet by mouth every 6 (six) hours as needed.    [provider]  lisdexamfetamine (VYVANSE) 50 MG capsule Take 50 mg by mouth daily.    [provider]  lurasidone (LATUDA) 20 MG TABS tablet Take 20 mg by mouth daily.    [provider]  Oxcarbazepine (TRILEPTAL) 300 MG tablet Take 1 tablet (300 mg total) by mouth 2 (two) times daily. 09/18/15   Pucilowska, Ellin GoodieJolanta B, MD    Allergies Patient has no known allergies.  Family History  Problem Relation Age of Onset  . Kidney cancer Neg Hx   . Kidney disease Neg Hx   . Prostate cancer Neg Hx     Social History Social History   Tobacco Use  . Smoking status: Current Every Day Smoker    Packs/day: 0.50    Types: Cigarettes  . Smokeless tobacco: Never Used  Substance Use Topics  . Alcohol use: No  . Drug use: No    Review of Systems  Constitutional: No fever/chills Eyes: No visual changes. ENT: No sore throat. Cardiovascular: Denies chest pain. Respiratory: Denies shortness of breath. Gastrointestinal: No abdominal pain.  No nausea, no vomiting.  No diarrhea.  No constipation. Genitourinary: Negative for dysuria. Musculoskeletal: Negative for back pain. Skin: Negative for rash. Neurological: Negative for headaches, focal weakness or numbness. Psychiatric:Positive for anxiety.  ____________________________________________   PHYSICAL EXAM:  VITAL SIGNS: ED Triage Vitals [10/04/17 0019]  Enc Vitals Group     BP 116/75     Pulse Rate 66     Resp 18     Temp 98.4 F (36.9 C)     Temp Source Oral     SpO2 97 %     Weight      Height      Head Circumference      Peak Flow      Pain Score 8       Pain Loc      Pain Edu?      Excl. in GC?     Constitutional: Asleep, awakened for exam.  Alert and oriented. Well appearing and in no acute distress. Eyes: Conjunctivae are normal. PERRL. EOMI. Head: Atraumatic. Nose: No congestion/rhinnorhea. Mouth/Throat: Mucous membranes are moist.  Oropharynx non-erythematous. Neck: No stridor.  No cervical spine tenderness to palpation.  Supple neck without meningismus. Cardiovascular: Normal rate, regular rhythm. Grossly normal heart sounds.  Good peripheral circulation. Respiratory: Normal respiratory effort.  No retractions. Lungs CTAB. Gastrointestinal: Soft and nontender. No distention. No abdominal bruits. No CVA tenderness. Musculoskeletal: No lower extremity tenderness nor edema.  No joint effusions. Neurologic: Alert and oriented x3.  CN II to XII grossly intact.  Normal speech and language. No gross focal neurologic deficits are appreciated. No gait instability. Skin:  Skin is warm, dry and intact. No rash noted.  No petechiae. Psychiatric: Mood and affect are normal. Speech and behavior are normal.  ____________________________________________   LABS (all labs ordered are listed, but only abnormal results are displayed)  Labs Reviewed  URINALYSIS, ROUTINE W REFLEX MICROSCOPIC - Abnormal; Notable for the following components:      Result Value   Color, Urine YELLOW (*)    APPearance CLEAR (*)    All other components within normal limits  CBC WITH DIFFERENTIAL/PLATELET  BASIC METABOLIC PANEL   ____________________________________________  EKG  None ____________________________________________  RADIOLOGY  ED MD interpretation: None  Official radiology report(s): No results found.  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  Denies active SI/HI/AH/VH.  Denies trauma or head injury.  Laboratory and urinalysis results unremarkable.  Will discharge home to follow-up with a psychiatrist.  Strict return precautions given.  Patient verbalizes understanding agrees with plan of care.      ____________________________________________   FINAL CLINICAL IMPRESSION(S) / ED DIAGNOSES  Final diagnoses:  Pseudoseizure  Seizure-like activity [facility])     ED Discharge Orders    None       Note:  This document was prepared using Dragon voice recognition software and may include unintentional dictation errors.    Irean Hong, MD 10/04/17 [phone]

## 2017-10-04 NOTE — ED Notes (Signed)
Pt remaining in room until [facility] can pick him up

## 2017-10-04 NOTE — ED Notes (Signed)
Received call from Mother Harlow MaresBrenda Moore whom states they will be here within 30 mins.  RN will monitor.

## 2017-10-04 NOTE — ED Notes (Signed)
Pt's visitor states he had three seizures yesterday "back to back". Visitor describes "he fell to the floor and started foaming at the mouth." visitor states pt "was shaking". Visitor states "after a while he came back to himself". 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 Pt arouses easily from sleep and has no complaints currently. Visitor states pt did not have incontinence with seizure like activity.

## 2017-10-04 NOTE — ED Notes (Signed)
This Rn attempted to contact Joel Arroyo for transport follow up. Unable to reach at this time.

## 2017-10-04 NOTE — ED Notes (Signed)
This RN spoke with Cathlean CowerVince McKnight and was instructed to contact Harlow MaresBrenda Moore as they are closer for pickup. This RN at this time called Harlow MaresBrenda Moore at [phone], she stated she was on the way to pick pt up. RN will monitor.

## 2017-10-04 NOTE — ED Notes (Signed)
Pt is resting awaiting transportation at this time. RN will continue to monitor.

## 2017-10-04 NOTE — ED Notes (Signed)
This RN called Harlow MaresBrenda Moore again to follow up with transportation. No answer RN left message. As it has been 1.5 hour

## 2017-10-04 NOTE — ED Triage Notes (Addendum)
Are give reports patient has had 3 seizures today.  Last approximately 1 hour prior to arrival.

## 2017-10-04 NOTE — ED Notes (Signed)
Report to cassie, rn.  

## 2017-10-04 NOTE — ED Notes (Signed)
Pt waiting patiently in [facility] for treatment room; awake and alert; no seizure like activity witnessed since arrival;

## 2017-10-04 NOTE — ED Notes (Addendum)
PT'S CONTACT INFORMATION FOR RIDE HOME AND GROUP HOME: Arrie AranBRENDA MOORE OR ROY PETTIFORD AT [phone]

## 2017-10-04 NOTE — Discharge Instructions (Addendum)
Take all your medications as directed by your doctor.  Return to the ER for worsening symptoms, persistent vomiting, lethargy, difficulty breathing or other concerns.

## 2017-10-04 NOTE — ED Notes (Signed)
Joel CowerVince McKnight was notified of pt D/C at 0706 by April RN.

## 2017-10-07 ENCOUNTER — Encounter: Payer: Self-pay | Admitting: Emergency Medicine

## 2017-10-07 ENCOUNTER — Emergency Department
Admission: EM | Admit: 2017-10-07 | Discharge: 2017-10-07 | Disposition: A | Discharge status: Home / Self Care | Payer: Medicaid Other | Attending: Emergency Medicine | Admitting: Emergency Medicine

## 2017-10-07 DIAGNOSIS — F319 Bipolar disorder, unspecified: Secondary | ICD-10-CM | POA: Diagnosis not present

## 2017-10-07 DIAGNOSIS — J45909 Unspecified asthma, uncomplicated: Secondary | ICD-10-CM | POA: Insufficient documentation

## 2017-10-07 DIAGNOSIS — S61519A Laceration without foreign body of unspecified wrist, initial encounter: Secondary | ICD-10-CM

## 2017-10-07 DIAGNOSIS — R569 Unspecified convulsions: Secondary | ICD-10-CM

## 2017-10-07 DIAGNOSIS — Z79899 Other long term (current) drug therapy: Secondary | ICD-10-CM | POA: Insufficient documentation

## 2017-10-07 DIAGNOSIS — R45851 Suicidal ideations: Secondary | ICD-10-CM | POA: Diagnosis not present

## 2017-10-07 DIAGNOSIS — F1210 Cannabis abuse, uncomplicated: Secondary | ICD-10-CM

## 2017-10-07 DIAGNOSIS — F17210 Nicotine dependence, cigarettes, uncomplicated: Secondary | ICD-10-CM | POA: Insufficient documentation

## 2017-10-07 DIAGNOSIS — R55 Syncope and collapse: Secondary | ICD-10-CM | POA: Diagnosis not present

## 2017-10-07 DIAGNOSIS — R42 Dizziness and giddiness: Secondary | ICD-10-CM | POA: Insufficient documentation

## 2017-10-07 DIAGNOSIS — X789XXA Intentional self-harm by unspecified sharp object, initial encounter: Secondary | ICD-10-CM

## 2017-10-07 DIAGNOSIS — F909 Attention-deficit hyperactivity disorder, unspecified type: Secondary | ICD-10-CM | POA: Diagnosis present

## 2017-10-07 DIAGNOSIS — F3181 Bipolar II disorder: Secondary | ICD-10-CM | POA: Diagnosis not present

## 2017-10-07 LAB — CBC
HCT: 43.6 % (ref 40.0–52.0)
HEMOGLOBIN: 14.8 g/dL (ref 13.0–18.0)
MCH: 32.9 pg (ref 26.0–34.0)
MCHC: 34 g/dL (ref 32.0–36.0)
MCV: 96.6 fL (ref 80.0–100.0)
Platelets: 256 10*3/uL (ref 150–440)
RBC: 4.52 MIL/uL (ref 4.40–5.90)
RDW: 13.6 % (ref 11.5–14.5)
WBC: 10.3 10*3/uL (ref 3.8–10.6)

## 2017-10-07 LAB — COMPREHENSIVE METABOLIC PANEL
ALT: 22 U/L (ref 0–44)
ANION GAP: 7 (ref 5–15)
AST: 32 U/L (ref 15–41)
Albumin: 4.6 g/dL (ref 3.5–5.0)
Alkaline Phosphatase: 90 U/L (ref 38–126)
BUN: 11 mg/dL (ref 6–20)
CHLORIDE: 102 mmol/L (ref 98–111)
CO2: 27 mmol/L (ref 22–32)
Calcium: 8.8 mg/dL — ABNORMAL LOW (ref 8.9–10.3)
Creatinine, Ser: 0.86 mg/dL (ref 0.61–1.24)
Glucose, Bld: 111 mg/dL — ABNORMAL HIGH (ref 70–99)
POTASSIUM: 3.6 mmol/L (ref 3.5–5.1)
SODIUM: 136 mmol/L (ref 135–145)
Total Bilirubin: 0.6 mg/dL (ref 0.3–1.2)
Total Protein: 7.6 g/dL (ref 6.5–8.1)

## 2017-10-07 LAB — URINE DRUG SCREEN, QUALITATIVE (ARMC ONLY)
AMPHETAMINES, UR SCREEN: POSITIVE — AB
Benzodiazepine, Ur Scrn: NOT DETECTED
CANNABINOID 50 NG, UR ~~LOC~~: POSITIVE — AB
COCAINE METABOLITE, UR ~~LOC~~: NOT DETECTED
MDMA (ECSTASY) UR SCREEN: NOT DETECTED
Methadone Scn, Ur: NOT DETECTED
Opiate, Ur Screen: NOT DETECTED
Phencyclidine (PCP) Ur S: NOT DETECTED
Tricyclic, Ur Screen: NOT DETECTED

## 2017-10-07 MED ORDER — NICOTINE 14 MG/24HR TD PT24
14.0000 mg | MEDICATED_PATCH | Freq: Once | TRANSDERMAL | Status: DC
  Administered 2017-10-07: 14 mg via TRANSDERMAL
  Filled 2017-10-07: qty 1

## 2017-10-07 MED ORDER — OXCARBAZEPINE 300 MG PO TABS
300.0000 mg | ORAL_TABLET | Freq: Two times a day (BID) | ORAL | Status: DC
  Administered 2017-10-07: 300 mg via ORAL
  Filled 2017-10-07: qty 1

## 2017-10-07 MED ORDER — IBUPROFEN 800 MG PO TABS
800.0000 mg | ORAL_TABLET | ORAL | Status: AC
  Administered 2017-10-07: 800 mg via ORAL
  Filled 2017-10-07: qty 1

## 2017-10-07 NOTE — ED Notes (Signed)
Informed legal guardian Cathlean CowerVince McKnight that patient is being discharged

## 2017-10-07 NOTE — ED Notes (Signed)
Patient discharged, patient received discharge papers. Patient received belongings and verbalized he has received all of his belongings. Patient appropriate and cooperative, Denies SI/HI AVH. Vital signs taken. NAD noted.

## 2017-10-07 NOTE — ED Notes (Signed)
Patient eating lunch.

## 2017-10-07 NOTE — Discharge Instructions (Addendum)
Please seek medical attention and help for any thoughts about wanting to harm yourself, harm others, any concerning change in behavior, severe depression, inappropriate drug use or any other new or concerning symptoms. ° °

## 2017-10-07 NOTE — ED Notes (Signed)
Spoke to OmnicareVince McKnight patients legal guardian [phone]) to inform him that patient is here and to give him an update on patient status.
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
## 2017-10-07 NOTE — ED Triage Notes (Signed)
Pt states that he thinks he is feeling this way from the weed. He reports he did not get it from the same person.

## 2017-10-07 NOTE — ED Triage Notes (Signed)
Pt reports he took a xanax this am when he smoked weed. Pt appears drowsy in triage.

## 2017-10-07 NOTE — ED Notes (Signed)
Pt belongings given to pt per RN approval  

## 2017-10-07 NOTE — ED Provider Notes (Signed)
Dr. Toni Amendlapacs has seen patient. Feels he is safe for discharge. Rescinded IVC.   Joel Arroyo, Joel Laspina, MD 10/07/17 [phone]

## 2017-10-07 NOTE — Consult Note (Signed)
[facility] Consult   Reason for Consult: Consult for 22 year old man with a history of bipolar disorder brought in by law enforcement after being found passed out this morning Referring Physician: Archie Balboa Patient Identification: KEIJUAN SCHELLHASE MRN:  [id number] Principal Diagnosis: Bipolar 2 disorder, major depressive episode [facility]) Diagnosis:   Patient Active Problem List   Diagnosis Date Noted  . Self-inflicted laceration of wrist [S61.519A] 10/07/2017  . Cannabis abuse [F12.10] 10/07/2017  . Seizure-like activity ([place]) [R56.9]   . Psychosis ([place]) [F29]   . Seizures ([place]) [R56.9] 09/06/2016  . Bipolar 2 disorder, major depressive episode ([place]) [F31.81] 09/15/2015  . Suicidal ideation [R45.851] 09/15/2015  . Homicidal ideation [R45.850] 09/15/2015  . ADHD (attention deficit hyperactivity disorder) [F90.9] 09/14/2015  . Involuntary commitment [Z04.6] 09/14/2015    Total Time spent with patient: 1 hour  Subjective:   WOODROW DRAB is a 22 y.o. male patient admitted with "I was just feeling bad".  HPI: Patient interviewed chart reviewed.  Patient was found passed out or semi-passed out in public this morning.  Patient tells me that he was walking to a convenience store to get something to eat when he fell out.  He had been feeling tired and sick for a while.  Had not been sleeping well for the last couple days.  He admits that last night he cut himself superficially on the wrist and had made some suicidal statements but denies having any actual wish to die.  Patient says his girlfriend broke up with him 2 days ago and at that time he ran away from home and has been out in the streets not sleeping well not taking care of himself for a couple days.  He denies having any wish to die or any wish to kill himself.  Denies any psychotic symptoms denies any wish to harm anyone else.  Currently calm and cooperative and agreeable to his medication.  Medical history: Superficial  cuts to the wrist needing no intervention  Substance abuse history: Patient admits to recent use of cannabis which seems to have worsened his condition  Social history: He is legally incompetent and his stepmother is his guardian.  He lives with her.  Past Psychiatric History: Patient has had a previous hospitalization here about 2 years ago.  Has a diagnosis of bipolar type II.  Has a history of self-mutilation.  Has been maintained on antidepressants and medicines for ADHD.  No history of violence to others.  Risk to Self:   Risk to Others:   Prior Inpatient Therapy:   Prior Outpatient Therapy:    Past Medical History:  Past Medical History:  Diagnosis Date  . Anxiety   . Asthma   . Bipolar 1 disorder ([place])   . Depression   . Seizures ([place])     Past Surgical History:  Procedure Laterality Date  . HERNIA REPAIR     Family History:  Family History  Problem Relation Age of Onset  . Kidney cancer Neg Hx   . Kidney disease Neg Hx   . Prostate cancer Neg Hx    Family Psychiatric  History: He denies any Social History:  Social History   Substance and Sexual Activity  Alcohol Use No     Social History   Substance and Sexual Activity  Drug Use No    Social History   Socioeconomic History  . Marital status: Single    Spouse name: Not on file  . Number of children: Not on file  .  Years of education: Not on file  . Highest education level: Not on file  Occupational History  . Not on file  Social Needs  . Financial resource strain: Not on file  . Food insecurity:    Worry: Not on file    Inability: Not on file  . Transportation needs:    Medical: Not on file    Non-medical: Not on file  Tobacco Use  . Smoking status: Current Every Day Smoker    Packs/day: 0.50    Types: Cigarettes  . Smokeless tobacco: Never Used  Substance and Sexual Activity  . Alcohol use: No  . Drug use: No  . Sexual activity: Not on file  Lifestyle  . Physical activity:    Days per  week: Not on file    Minutes per session: Not on file  . Stress: Not on file  Relationships  . Social connections:    Talks on phone: Not on file    Gets together: Not on file    Attends religious service: Not on file    Active member of club or organization: Not on file    Attends meetings of clubs or organizations: Not on file    Relationship status: Not on file  Other Topics Concern  . Not on file  Social History Narrative  . Not on file   Additional Social History:    Allergies:  No Known Allergies  Labs:  Results for orders placed or performed during the hospital encounter of 10/07/17 (from the past 48 hour(s))  Comprehensive metabolic panel     Status: Abnormal   Collection Time: 10/07/17  7:39 AM  Result Value Ref Range   Sodium 136 135 - 145 mmol/L   Potassium 3.6 3.5 - 5.1 mmol/L   Chloride 102 98 - 111 mmol/L    Comment: Please note change in reference range.   CO2 27 22 - 32 mmol/L   Glucose, Bld 111 (H) 70 - 99 mg/dL    Comment: Please note change in reference range.   BUN 11 6 - 20 mg/dL    Comment: Please note change in reference range.   Creatinine, Ser 0.86 0.61 - 1.24 mg/dL   Calcium 8.8 (L) 8.9 - 10.3 mg/dL   Total Protein 7.6 6.5 - 8.1 g/dL   Albumin 4.6 3.5 - 5.0 g/dL   AST 32 15 - 41 U/L   ALT 22 0 - 44 U/L    Comment: Please note change in reference range.   Alkaline Phosphatase 90 38 - 126 U/L   Total Bilirubin 0.6 0.3 - 1.2 mg/dL   GFR calc non Af Amer >60 >60 mL/min   GFR calc Af Amer >60 >60 mL/min    Comment: (NOTE) The eGFR has been calculated using the CKD EPI equation. This calculation has not been validated in all clinical situations. eGFR's persistently <60 mL/min signify possible Chronic Kidney Disease.    Anion gap 7 5 - 15    Comment: Performed at [facility], [address] [place], [place] [address]  cbc     Status: None   Collection Time: 10/07/17  7:39 AM  Result Value Ref Range   WBC 10.3 3.8 - 10.6 K/uL    RBC 4.52 4.40 - 5.90 MIL/uL   Hemoglobin 14.8 13.0 - 18.0 g/dL   HCT 43.6 40.0 - 52.0 %   MCV 96.6 80.0 - 100.0 fL   MCH 32.9 26.0 - 34.0 pg   MCHC 34.0 32.0 -  36.0 g/dL   RDW 13.6 11.5 - 14.5 %   Platelets 256 150 - 440 K/uL    Comment: Performed at [facility], [address] [place], [place] [address]  Urine Drug Screen, Qualitative     Status: Abnormal   Collection Time: 10/07/17  7:39 AM  Result Value Ref Range   Tricyclic, Ur Screen NONE DETECTED NONE DETECTED   Amphetamines, Ur Screen POSITIVE (A) NONE DETECTED   MDMA (Ecstasy)Ur Screen NONE DETECTED NONE DETECTED   Cocaine Metabolite,Ur [place] NONE DETECTED NONE DETECTED   Opiate, Ur Screen NONE DETECTED NONE DETECTED   Phencyclidine (PCP) Ur S NONE DETECTED NONE DETECTED   Cannabinoid 50 Ng, Ur [place] POSITIVE (A) NONE DETECTED   Barbiturates, Ur Screen (A) NONE DETECTED    Result not available. Reagent lot number recalled by manufacturer.   Benzodiazepine, Ur Scrn NONE DETECTED NONE DETECTED   Methadone Scn, Ur NONE DETECTED NONE DETECTED    Comment: (NOTE) Tricyclics + metabolites, urine    Cutoff 1000 ng/mL Amphetamines + metabolites, urine  Cutoff 1000 ng/mL MDMA (Ecstasy), urine              Cutoff 500 ng/mL Cocaine Metabolite, urine          Cutoff 300 ng/mL Opiate + metabolites, urine        Cutoff 300 ng/mL Phencyclidine (PCP), urine         Cutoff 25 ng/mL Cannabinoid, urine                 Cutoff 50 ng/mL Barbiturates + metabolites, urine  Cutoff 200 ng/mL Benzodiazepine, urine              Cutoff 200 ng/mL Methadone, urine                   Cutoff 300 ng/mL The urine drug screen provides only a preliminary, unconfirmed analytical test result and should not be used for non-medical purposes. Clinical consideration and professional judgment should be applied to any positive drug screen result due to possible interfering substances. A more specific alternate chemical method must be used in order to obtain a  confirmed analytical result. Gas chromatography / mass spectrometry (GC/MS) is the preferred confirmat ory method. Performed at [facility], [address] [place], [place] [address]     Current Facility-Administered Medications  Medication Dose Route Frequency Provider Last Rate Last Dose  . nicotine (NICODERM CQ - dosed in mg/24 hours) patch 14 mg  14 mg Transdermal Once Delman Kitten, MD   14 mg at 10/07/17 1356  . Oxcarbazepine (TRILEPTAL) tablet 300 mg  300 mg Oral BID Delman Kitten, MD   300 mg at 10/07/17 1040   Current Outpatient Medications  Medication Sig Dispense Refill  . Albuterol Sulfate 108 (90 Base) MCG/ACT AEPB Inhale 1 puff into the lungs every 4 (four) hours as needed.    Marland Kitchen amoxicillin (AMOXIL) 500 MG capsule Take 1 capsule (500 mg total) by mouth 3 (three) times daily. (Patient not taking: Reported on 04/24/2017) 21 capsule 0  . diazepam (DIASTAT ACUDIAL) 10 MG GEL Place 10 mg rectally once. (Patient not taking: Reported on 04/24/2017) 1 Package 0  . fluvoxaMINE (LUVOX) 100 MG tablet Take 1 tablet (100 mg total) by mouth at bedtime. 30 tablet 0  . ibuprofen (ADVIL,MOTRIN) 800 MG tablet Take 1 tablet by mouth every 6 (six) hours as needed.    Marland Kitchen lisdexamfetamine (VYVANSE) 50 MG capsule Take 50 mg by mouth  daily.    . lurasidone (LATUDA) 20 MG TABS tablet Take 20 mg by mouth daily.    . Oxcarbazepine (TRILEPTAL) 300 MG tablet Take 1 tablet (300 mg total) by mouth 2 (two) times daily. 60 tablet 0    Musculoskeletal: Strength & Muscle Tone: within normal limits Gait & Station: normal Patient leans: N/A  Psychiatric Specialty Exam: Physical Exam  Nursing note and vitals reviewed. Constitutional: He appears well-developed and well-nourished.  HENT:  Head: Normocephalic and atraumatic.  Eyes: Pupils are equal, round, and reactive to light. Conjunctivae are normal.  Neck: Normal range of motion.  Cardiovascular: Regular rhythm and normal heart sounds.    Respiratory: Effort normal. No respiratory distress.  GI: Soft.  Musculoskeletal: Normal range of motion.  Neurological: He is alert.  Skin: Skin is warm and dry.     Psychiatric: His affect is blunt. His speech is delayed. He is slowed. Cognition and memory are normal. He expresses impulsivity. He expresses no homicidal and no suicidal ideation.    Review of Systems  Constitutional: Negative.   HENT: Negative.   Eyes: Negative.   Respiratory: Negative.   Cardiovascular: Negative.   Gastrointestinal: Negative.   Musculoskeletal: Negative.   Skin: Negative.   Neurological: Negative.   Psychiatric/Behavioral: Positive for depression and substance abuse. Negative for hallucinations, memory loss and suicidal ideas. The patient is not nervous/anxious and does not have insomnia.     Blood pressure 112/64, pulse (!) 59, temperature 98.1 F (36.7 C), temperature source Oral, resp. rate 20, height 6' 1"  (1.854 m), weight 79.4 kg (175 lb), SpO2 97 %.Body mass index is 23.09 kg/m.  General Appearance: Fairly Groomed  Eye Contact:  Fair  Speech:  Clear and Coherent  Volume:  Decreased  Mood:  Dysphoric  Affect:  Congruent  Thought Process:  Goal Directed  Orientation:  Full (Time, Place, and Person)  Thought Content:  Logical  Suicidal Thoughts:  No  Homicidal Thoughts:  No  Memory:  Immediate;   Fair Recent;   Fair Remote;   Fair  Judgement:  Fair  Insight:  Fair  Psychomotor Activity:  Decreased  Concentration:  Concentration: Fair  Recall:  [employer] of Knowledge:  Fair  Language:  Fair  Akathisia:  No  Handed:  Right  AIMS (if indicated):     Assets:  Desire for Improvement Housing Resilience Social Support  ADL's:  Intact  Cognition:  WNL  Sleep:        Treatment Plan Summary: Plan 22 year old man with a history of chronic mood and behavior symptoms.  Broke up with his girlfriend a couple days ago.  Ran away from home and has been agitated and not sleeping well.   Made some cuts to his wrist.  This afternoon he is lucid and calm and appropriate.  Denies any suicidal wish at all.  Articulates a desire to go back home to his stepmother's house.  Cooperative with medication.  Patient is not currently committable.  Discontinue IVC.  Brief counseling completed and review of treatment plan.  Case reviewed with emergency room doctor and TTS.  Disposition: No evidence of imminent risk to self or others at present.   Patient does not meet criteria for psychiatric inpatient admission. Supportive therapy provided about ongoing stressors. Discussed crisis plan, support from social network, calling 911, coming to the Emergency Department, and calling Suicide Hotline.  Alethia Berthold, MD 10/07/2017 4:30 PM

## 2017-10-07 NOTE — ED Triage Notes (Signed)
Pt in via EMS from laying on the ground in the parking lot. EMS reports pt c/o dizziness and headache. EMS reports pt told them last pm he did smoke weed and  he attempted to kill himself and has cuts on his wrist. Pt with hx of seizures. 118/70,  61HR.  Pt reports Pt reports he was walking to the store and he felt dizzy. Pt states that he is unsure of he laid down or what happened. Pt reports he does not take medication for seizures. Pt admits to SI but states its only a little bit not much. Pt with abrasions noted to left wrist.

## 2017-10-07 NOTE — ED Provider Notes (Addendum)
[facility] Emergency Depart ment Provider Note   ____________________________________________   First MD Initiated Contact with Patient 10/07/17 [phone]     (approximate)  I have reviewed the triage vital signs and the nursing notes.   HISTORY  Chief Complaint Headache and Suicidal   HPI Joel Arroyo is a 22 y.o. male reports that he used a Xanax tablet and then smoked marijuana this morning, felt like he wanted something to eat and began walking to the store when he got lightheaded.  Is not quite certain but thinks he sort of laid down or set himself down in a parking lot, then he woke up there.  Denies seizure.  Did not bite his tongue.  Did not urinate on himself.  Also reports she is been having thoughts about hurting himself and feeling a little bit suicidal for about 3 days.  Reports he stopped taking all of his medications for "bipolar" disorder 3 days ago as well.  Does not use alcohol regularly.  Did not use today.  No chest pain or trouble breathing.  Past Medical History:  Diagnosis Date  . Anxiety   . Asthma   . Bipolar 1 disorder ([facility])   . Depression   . Seizures [facility])     Patient Active Problem List   Diagnosis Date Noted  . Seizure-like activity ([facility])   . Psychosis ([facility])   . Seizures ([facility]) 09/06/2016  . Bipolar 2 disorder, major depressive episode ([facility]) 09/15/2015  . Suicidal ideation 09/15/2015  . Homicidal ideation 09/15/2015  . ADHD (attention deficit hyperactivity disorder) 09/14/2015  . Involuntary commitment 09/14/2015    Past Surgical History:  Procedure Laterality Date  . HERNIA REPAIR      Prior to Admission medications   Medication Sig Start Date End Date Taking? Authorizing Provider  Albuterol Sulfate 108 (90 Base) MCG/ACT AEPB Inhale 1 puff into the lungs every 4 (four) hours as needed.    [provider]  amoxicillin (AMOXIL) 500 MG capsule Take 1 capsule (500 mg total) by mouth 3 (three) times  daily. Patient not taking: Reported on 04/24/2017 03/30/17   Irean Hong, MD  diazepam (DIASTAT ACUDIAL) 10 MG GEL Place 10 mg rectally once. Patient not taking: Reported on 04/24/2017 09/14/16 10/11/16  Sharman Cheek, MD  fluvoxaMINE (LUVOX) 100 MG tablet Take 1 tablet (100 mg total) by mouth at bedtime. 09/18/15   Pucilowska, Jolanta B, MD  ibuprofen (ADVIL,MOTRIN) 800 MG tablet Take 1 tablet by mouth every 6 (six) hours as needed.    [provider]  lisdexamfetamine (VYVANSE) 50 MG capsule Take 50 mg by mouth daily.    [provider]  lurasidone (LATUDA) 20 MG TABS tablet Take 20 mg by mouth daily.    [provider]  Oxcarbazepine (TRILEPTAL) 300 MG tablet Take 1 tablet (300 mg total) by mouth 2 (two) times daily. 09/18/15   Pucilowska, Ellin Goodie, MD    Allergies Patient has no known allergies.  Family History  Problem Relation Age of Onset  . Kidney cancer Neg Hx   . Kidney disease Neg Hx   . Prostate cancer Neg Hx     Social History Social History   Tobacco Use  . Smoking status: Current Every Day Smoker    Packs/day: 0.50    Types: Cigarettes  . Smokeless tobacco: Never Used  Substance Use Topics  . Alcohol use: No  . Drug use: No    Review of Systems Constitutional: No fever/chills Eyes:  No visual changes. ENT: No sore throat. Cardiovascular: Denies chest pain. Respiratory: Denies shortness of breath. Gastrointestinal: No abdominal pain.  No nausea, no vomiting.  No diarrhea.  No constipation. Genitourinary: Negative for dysuria. Musculoskeletal: Negative for back pain. Skin: Negative for rash. Neurological: Negative  focal weakness or numbness.  Mild feeling of tenderness across the back of the occiput.    ____________________________________________   PHYSICAL EXAM:  VITAL SIGNS: ED Triage Vitals  Enc Vitals Group     BP 10/07/17 0720 112/64     Pulse Rate 10/07/17 0720 (!) 59     Resp 10/07/17 0720 20     Temp 10/07/17 0720  98.1 F (36.7 C)     Temp Source 10/07/17 0720 Oral     SpO2 10/07/17 0720 97 %     Weight 10/07/17 0721 175 lb (79.4 kg)     Height 10/07/17 0721 6\' 1"  (1.854 m)     Head Circumference --      Peak Flow --      Pain Score 10/07/17 0721 10     Pain Loc --      Pain Edu? --      Excl. in GC? --     Constitutional: Alert and oriented. Well appearing and in no acute distress.  He is pleasant.  Somewhat flattened affect. Eyes: Conjunctivae are normal. Head: Atraumatic.  Reports soreness across the occiput, but no hematoma injury or contusion to noted.  No cervical spine tenderness. Nose: No congestion/rhinnorhea. Mouth/Throat: Mucous membranes are moist. Neck: No stridor.   Cardiovascular: Normal rate, regular rhythm. Grossly normal heart sounds.  Good peripheral circulation. Respiratory: Normal respiratory effort.  No retractions. Lungs CTAB. Gastrointestinal: Soft and nontender. No distention. Musculoskeletal: No lower extremity tenderness nor edema. Neurologic:  Normal speech and language. No gross focal neurologic deficits are appreciated.  Skin:  Skin is warm, dry and intact. No rash noted. Psychiatric: Mood and affect are n flat.  Overall pleasant, but does endorse some suicidal thoughts but no active plan.  Denies that he would want to harm himself or make an attempt to harm himself while in the emergency room.  He would like to see a psychiatrist.  ____________________________________________   LABS (all labs ordered are listed, but only abnormal results are displayed)  Labs Reviewed  COMPREHENSIVE METABOLIC PANEL - Abnormal; Notable for the following components:      Result Value   Glucose, Bld 111 (*)    Calcium 8.8 (*)    All other components within normal limits  URINE DRUG SCREEN, QUALITATIVE ([facility] ONLY) - Abnormal; Notable for the following components:   Amphetamines, Ur Screen POSITIVE (*)    Cannabinoid 50 Ng, Ur [place] POSITIVE (*)    Barbiturates, Ur Screen   (*)      Value: Result not available. Reagent lot number recalled by manufacturer.   All other components within normal limits  CBC   ____________________________________________  EKG  Reviewed and entered by me at 7:40 AM Heart rate 50 QRS 90 QTc 400 Sinus bradycardia, slight right axis.  No evidence of acute ischemia ____________________________________________  RADIOLOGY    ____________________________________________   PROCEDURES  Procedure(s) performed: None  Procedures  Critical Care performed: No  ____________________________________________   INITIAL IMPRESSION / ASSESSMENT AND PLAN / ED COURSE  Pertinent labs & imaging results that were available during my care of the patient were reviewed by me and considered in my medical decision making (see chart for details).  1) near syncope.  Patient reports he took a Xanax this morning, then uses marijuana, and that without having anything to eat or drink he decided to walk to a nearby store.  He got lightheaded, then is not sure but thinks he either laid down or fell to the ground but does not feel like he fell hard.  He does report he feels just slightly sore over the back of his head but there is no bruising or evidence of bleeding.  No contusion.  He is not anticoagulated.  Observe him, plan to give ibuprofen probable contusion.  EKG reassuring.  No preceding symptoms other than reporting feeling lightheaded.  Now fully awake and alert with normal vital signs.  2) passive suicidal ideation.  Reports suicidal ideation for 2 days, but denies a plan and at the present time does not want to harm himself but does have thoughts about hurting himself over the last couple of days and would like to see his psychiatrist.  Because of his presentation today and ongoing endorsement of suicidal thoughts, but no plan I will place the patient under IVC and request psychiatry consult.       [place] CT Head Rule   CT head is recommended  if yes to ANY of the following:   Major Criteria ("high risk" for an injury requiring neurosurgical intervention, sensitivity 100%):   No.   GCS < 15 at 2 hours post-injury No.   Suspected open or depressed skull fracture No.   Any sign of basilar skull fracture? (Hemotympanum, racoon eyes, battle's sign, CSF oto/rhinorrhea) No.   ? 2 episodes of vomiting No.   Age ? 65   Minor Criteria ("medium" risk for an intracranial traumatic finding, sensitivity 83-100%):   No.   Retrograde Amnesia to the Event ? 30 minutes No.   "Dangerous" Mechanism? (Pedestrian struck by motor vehicle, occupant ejected from motor vehicle, fall from >3 ft or >5 stairs.)   Based on my evaluation of the patient, including application of this decision instrument, CT head to evaluate for traumatic intracranial injury is not indicated at this time.  Patient denies any symptoms that sound like a seizure.  Does have a history of possible seizure disorder though also consider potential pseudoseizure.  Denies seizure-like activity today.  ----------------------------------------- 3:23 PM on 10/07/2017 -----------------------------------------  Ongoing care assigned to Dr. Derrill Kay, follow-up on psychiatric consultation recommendations.  Patiently currently under IVC. ____________________________________________   FINAL CLINICAL IMPRESSION(S) / ED DIAGNOSES  Final diagnoses:  Near syncope  Suicidal ideation      NEW MEDICATIONS STARTED DURING THIS VISIT:  New Prescriptions   No medications on file     Note:  This document was prepared using Dragon voice recognition software and may include unintentional dictation errors.     Sharyn Creamer, MD 10/07/17 1521    Sharyn Creamer, MD 10/07/17 [phone]

## 2017-10-08 ENCOUNTER — Encounter: Payer: Self-pay | Admitting: Emergency Medicine

## 2017-10-08 ENCOUNTER — Emergency Department
Admission: EM | Admit: 2017-10-08 | Discharge: 2017-10-08 | Disposition: A | Discharge status: Home / Self Care | Payer: Medicaid Other | Attending: Emergency Medicine | Admitting: Emergency Medicine

## 2017-10-08 DIAGNOSIS — R569 Unspecified convulsions: Secondary | ICD-10-CM | POA: Diagnosis present

## 2017-10-08 DIAGNOSIS — J45909 Unspecified asthma, uncomplicated: Secondary | ICD-10-CM | POA: Insufficient documentation

## 2017-10-08 DIAGNOSIS — F1210 Cannabis abuse, uncomplicated: Secondary | ICD-10-CM | POA: Diagnosis not present

## 2017-10-08 DIAGNOSIS — F17210 Nicotine dependence, cigarettes, uncomplicated: Secondary | ICD-10-CM | POA: Insufficient documentation

## 2017-10-08 DIAGNOSIS — F909 Attention-deficit hyperactivity disorder, unspecified type: Secondary | ICD-10-CM | POA: Diagnosis not present

## 2017-10-08 DIAGNOSIS — F319 Bipolar disorder, unspecified: Secondary | ICD-10-CM | POA: Diagnosis not present

## 2017-10-08 DIAGNOSIS — Z79899 Other long term (current) drug therapy: Secondary | ICD-10-CM | POA: Diagnosis not present

## 2017-10-08 DIAGNOSIS — F419 Anxiety disorder, unspecified: Secondary | ICD-10-CM | POA: Insufficient documentation

## 2017-10-08 LAB — BASIC METABOLIC PANEL
Anion gap: 9 (ref 5–15)
BUN: 10 mg/dL (ref 6–20)
CALCIUM: 8.9 mg/dL (ref 8.9–10.3)
CO2: 25 mmol/L (ref 22–32)
Chloride: 104 mmol/L (ref 98–111)
Creatinine, Ser: 0.98 mg/dL (ref 0.61–1.24)
GFR calc Af Amer: >60 mL/min (ref >60)
GFR calc non Af Amer: >60 mL/min (ref >60)
GLUCOSE: 111 mg/dL — AB (ref 70–99)
Potassium: 3.3 mmol/L — ABNORMAL LOW (ref 3.5–5.1)
Sodium: 138 mmol/L (ref 135–145)

## 2017-10-08 LAB — CBC
HCT: 41.6 % (ref 40.0–52.0)
Hemoglobin: 14.3 g/dL (ref 13.0–18.0)
MCH: 33.2 pg (ref 26.0–34.0)
MCHC: 34.4 g/dL (ref 32.0–36.0)
MCV: 96.5 fL (ref 80.0–100.0)
PLATELETS: 245 10*3/uL (ref 150–440)
RBC: 4.32 MIL/uL — ABNORMAL LOW (ref 4.40–5.90)
RDW: 13.4 % (ref 11.5–14.5)
WBC: 11.3 10*3/uL — ABNORMAL HIGH (ref 3.8–10.6)

## 2017-10-08 NOTE — ED Triage Notes (Signed)
Pt arrived via EMS from [facility] where pt had seizure like activity post arrest. Pt to ED A&O x4. Pt able to answer all questions. Pt has urine on pants from incident at [facility]. MD at bedside.

## 2017-10-08 NOTE — ED Provider Notes (Signed)
[facility] Emergency Department Provider Note    First MD Initiated Contact with Patient 10/08/17 [phone]     (approximate)  I have reviewed the triage vital signs and the nursing notes.   HISTORY  Chief Complaint Seizures    HPI Joel Arroyo is a 22 y.o. male with below list of chronic medical conditions presents to the emergency department in police custody following concern for possible seizure-like activity while in custody to jail.  Police officer states that the patient was rigid and urinated on himself.  No tonic-clonic like activity.  Patient denies any complaints at present.  Patient was seen the emergency department yesterday secondary to using Xanax and smoking marijuana and suicidal ideation.  Patient was cleared by psychiatry for discharge patient denies any SI at present  Past Medical History:  Diagnosis Date  . Anxiety   . Asthma   . Bipolar 1 disorder (HCC)   . Depression   . Seizures [facility](HCC)     Patient Active Problem List   Diagnosis Date Noted  . Self-inflicted laceration of wrist 10/07/2017  . Cannabis abuse 10/07/2017  . Seizure-like activity (HCC)   . Psychosis (HCC)   . Seizures (HCC) 09/06/2016  . Bipolar 2 disorder, major depressive episode (HCC) 09/15/2015  . Suicidal ideation 09/15/2015  . Homicidal ideation 09/15/2015  . ADHD (attention deficit hyperactivity disorder) 09/14/2015  . Involuntary commitment 09/14/2015    Past Surgical History:  Procedure Laterality Date  . HERNIA REPAIR      Prior to Admission medications   Medication Sig Start Date End Date Taking? Authorizing Provider  Albuterol Sulfate 108 (90 Base) MCG/ACT AEPB Inhale 1 puff into the lungs every 4 (four) hours as needed.    [provider]  amoxicillin (AMOXIL) 500 MG capsule Take 1 capsule (500 mg total) by mouth 3 (three) times daily. Patient not taking: Reported on 04/24/2017 03/30/17   Irean HongSung, Jade J, MD  diazepam (DIASTAT ACUDIAL) 10  MG GEL Place 10 mg rectally once. Patient not taking: Reported on 04/24/2017 09/14/16 10/11/16  Sharman CheekStafford, Phillip, MD  fluvoxaMINE (LUVOX) 100 MG tablet Take 1 tablet (100 mg total) by mouth at bedtime. 09/18/15   Pucilowska, Jolanta B, MD  ibuprofen (ADVIL,MOTRIN) 800 MG tablet Take 1 tablet by mouth every 6 (six) hours as needed.    [provider]  lisdexamfetamine (VYVANSE) 50 MG capsule Take 50 mg by mouth daily.    [provider]  lurasidone (LATUDA) 20 MG TABS tablet Take 20 mg by mouth daily.    [provider]  Oxcarbazepine (TRILEPTAL) 300 MG tablet Take 1 tablet (300 mg total) by mouth 2 (two) times daily. 09/18/15   Pucilowska, Ellin GoodieJolanta B, MD    Allergies Patient has no known allergies.  Family History  Problem Relation Age of Onset  . Kidney cancer Neg Hx   . Kidney disease Neg Hx   . Prostate cancer Neg Hx     Social History Social History   Tobacco Use  . Smoking status: Current Every Day Smoker    Packs/day: 0.50    Types: Cigarettes  . Smokeless tobacco: Never Used  Substance Use Topics  . Alcohol use: No  . Drug use: No    Review of Systems Constitutional: No fever/chills Eyes: No visual changes. ENT: No sore throat. Cardiovascular: Denies chest pain. Respiratory: Denies shortness of breath. Gastrointestinal: No abdominal pain.  No nausea, no vomiting.  No diarrhea.  No constipation. Genitourinary: Negative for dysuria.  Musculoskeletal: Negative for neck pain.  Negative for back pain. Integumentary: Negative for rash. Neurological: Negative for headaches, focal weakness or numbness. Psychiatric:Negative for suicidal or homicidal ideation   ____________________________________________   PHYSICAL EXAM:  VITAL SIGNS: ED Triage Vitals  Enc Vitals Group     BP 10/08/17 0504 109/77     Pulse Rate 10/08/17 0504 66     Resp --      Temp --      Temp src --      SpO2 10/08/17 0504 98 %     Weight 10/08/17 0516 79.4 kg (175 lb)      Height --      Head Circumference --      Peak Flow --      Pain Score 10/08/17 0610 0     Pain Loc --      Pain Edu? --      Excl. in GC? --     Constitutional: Alert and oriented. Well appearing and in no acute distress. Eyes: Conjunctivae are normal. PERRL. EOMI. Head: Atraumatic. Mouth/Throat: Mucous membranes are moist.  Oropharynx non-erythematous. Neck: No stridor.   Cardiovascular: Normal rate, regular rhythm. Good peripheral circulation. Grossly normal heart sounds. Respiratory: Normal respiratory effort.  No retractions. Lungs CTAB. Gastrointestinal: Soft and nontender. No distention.  Musculoskeletal: No lower extremity tenderness nor edema. No gross deformities of extremities. Neurologic:  Normal speech and language. No gross focal neurologic deficits are appreciated.  Skin:  Skin is warm, dry and intact. No rash noted. Psychiatric: Mood and affect are normal. Speech and behavior are normal.  ____________________________________________   LABS (all labs ordered are listed, but only abnormal results are displayed)  Labs Reviewed  CBC - Abnormal; Notable for the following components:      Result Value   WBC 11.3 (*)    RBC 4.32 (*)    All other components within normal limits  BASIC METABOLIC PANEL  URINE DRUG SCREEN, QUALITATIVE ([facility] ONLY)    Procedures   ____________________________________________   INITIAL IMPRESSION / ASSESSMENT AND PLAN / ED COURSE  As part of my medical decision making, I reviewed the following data within the electronic MEDICAL RECORD NUMBER   22 year old male presenting to the emergency department secondary to above-stated history and physical exam of seizure-like activity.  Patient had no further episodes while in the emergency department.  Review of the patient's chart revealed a EEG was performed 2018 which revealed no epileptiform activity.  Review of the patient's medication reveals no antiepileptic medications prescribed.   Pseudoseizure noted in history as well.  ___________________________________  FINAL CLINICAL IMPRESSION(S) / ED DIAGNOSES  Final diagnoses:  Seizure-like activity (HCC)     MEDICATIONS GIVEN DURING THIS VISIT:  Medications - No data to display   ED Discharge Orders    None       Note:  This document was prepared using Dragon voice recognition software and may include unintentional dictation errors.    Darci Current, MD 10/08/17 [phone]

## 2018-08-17 ENCOUNTER — Encounter: Payer: Self-pay | Admitting: Behavioral Health

## 2018-08-17 ENCOUNTER — Inpatient Hospital Stay
Admission: RE | Admit: 2018-08-17 | Discharge: 2018-08-23 | DRG: 885 | Disposition: A | Discharge status: Home / Self Care | Payer: Medicaid Other | Source: Intra-hospital | Attending: Psychiatry | Admitting: Psychiatry

## 2018-08-17 ENCOUNTER — Emergency Department: Payer: Medicaid Other

## 2018-08-17 ENCOUNTER — Emergency Department
Admission: EM | Admit: 2018-08-17 | Discharge: 2018-08-17 | Disposition: A | Discharge status: Other Acute Inpatient Hospital | Payer: Medicaid Other | Attending: Emergency Medicine | Admitting: Emergency Medicine

## 2018-08-17 ENCOUNTER — Encounter: Payer: Self-pay | Admitting: Emergency Medicine

## 2018-08-17 ENCOUNTER — Other Ambulatory Visit: Payer: Self-pay

## 2018-08-17 DIAGNOSIS — Z1159 Encounter for screening for other viral diseases: Secondary | ICD-10-CM | POA: Insufficient documentation

## 2018-08-17 DIAGNOSIS — Z79899 Other long term (current) drug therapy: Secondary | ICD-10-CM | POA: Insufficient documentation

## 2018-08-17 DIAGNOSIS — F17210 Nicotine dependence, cigarettes, uncomplicated: Secondary | ICD-10-CM | POA: Insufficient documentation

## 2018-08-17 DIAGNOSIS — F322 Major depressive disorder, single episode, severe without psychotic features: Secondary | ICD-10-CM | POA: Diagnosis present

## 2018-08-17 DIAGNOSIS — F329 Major depressive disorder, single episode, unspecified: Secondary | ICD-10-CM | POA: Insufficient documentation

## 2018-08-17 DIAGNOSIS — R45851 Suicidal ideations: Secondary | ICD-10-CM | POA: Insufficient documentation

## 2018-08-17 DIAGNOSIS — X789XXA Intentional self-harm by unspecified sharp object, initial encounter: Secondary | ICD-10-CM | POA: Diagnosis present

## 2018-08-17 DIAGNOSIS — Z046 Encounter for general psychiatric examination, requested by authority: Secondary | ICD-10-CM | POA: Insufficient documentation

## 2018-08-17 DIAGNOSIS — J45909 Unspecified asthma, uncomplicated: Secondary | ICD-10-CM | POA: Insufficient documentation

## 2018-08-17 DIAGNOSIS — F909 Attention-deficit hyperactivity disorder, unspecified type: Secondary | ICD-10-CM | POA: Diagnosis present

## 2018-08-17 DIAGNOSIS — S61519A Laceration without foreign body of unspecified wrist, initial encounter: Secondary | ICD-10-CM | POA: Diagnosis present

## 2018-08-17 DIAGNOSIS — R740 Nonspecific elevation of levels of transaminase and lactic acid dehydrogenase [LDH]: Secondary | ICD-10-CM | POA: Diagnosis not present

## 2018-08-17 DIAGNOSIS — F319 Bipolar disorder, unspecified: Principal | ICD-10-CM | POA: Diagnosis present

## 2018-08-17 DIAGNOSIS — F32A Depression, unspecified: Secondary | ICD-10-CM

## 2018-08-17 DIAGNOSIS — R7401 Elevation of levels of liver transaminase levels: Secondary | ICD-10-CM

## 2018-08-17 LAB — CBC
HCT: 44.7 % (ref 39.0–52.0)
Hemoglobin: 15.2 g/dL (ref 13.0–17.0)
MCH: 31.9 pg (ref 26.0–34.0)
MCHC: 34 g/dL (ref 30.0–36.0)
MCV: 93.9 fL (ref 80.0–100.0)
Platelets: 244 10*3/uL (ref 150–400)
RBC: 4.76 MIL/uL (ref 4.22–5.81)
RDW: 11.8 % (ref 11.5–15.5)
WBC: 8 10*3/uL (ref 4.0–10.5)
nRBC: 0 % (ref 0.0–0.2)

## 2018-08-17 LAB — URINE DRUG SCREEN, QUALITATIVE (ARMC ONLY)
Amphetamines, Ur Screen: POSITIVE — AB
Barbiturates, Ur Screen: NOT DETECTED
Benzodiazepine, Ur Scrn: NOT DETECTED
Cannabinoid 50 Ng, Ur ~~LOC~~: NOT DETECTED
Cocaine Metabolite,Ur ~~LOC~~: NOT DETECTED
MDMA (Ecstasy)Ur Screen: NOT DETECTED
Methadone Scn, Ur: NOT DETECTED
Opiate, Ur Screen: NOT DETECTED
Phencyclidine (PCP) Ur S: NOT DETECTED
Tricyclic, Ur Screen: NOT DETECTED

## 2018-08-17 LAB — COMPREHENSIVE METABOLIC PANEL
ALT: 84 U/L — ABNORMAL HIGH (ref 0–44)
AST: 335 U/L — ABNORMAL HIGH (ref 15–41)
Albumin: 4.7 g/dL (ref 3.5–5.0)
Alkaline Phosphatase: 72 U/L (ref 38–126)
Anion gap: 9 (ref 5–15)
BUN: 21 mg/dL — ABNORMAL HIGH (ref 6–20)
CO2: 24 mmol/L (ref 22–32)
Calcium: 9.1 mg/dL (ref 8.9–10.3)
Chloride: 106 mmol/L (ref 98–111)
Creatinine, Ser: 0.87 mg/dL (ref 0.61–1.24)
GFR calc Af Amer: >60 mL/min (ref >60)
GFR calc non Af Amer: >60 mL/min (ref >60)
Glucose, Bld: 114 mg/dL — ABNORMAL HIGH (ref 70–99)
Potassium: 3.6 mmol/L (ref 3.5–5.1)
Sodium: 139 mmol/L (ref 135–145)
Total Bilirubin: 0.5 mg/dL (ref 0.3–1.2)
Total Protein: 7.5 g/dL (ref 6.5–8.1)

## 2018-08-17 LAB — ETHANOL: Alcohol, Ethyl (B): <10 mg/dL (ref <10)

## 2018-08-17 LAB — SALICYLATE LEVEL: Salicylate Lvl: <7 mg/dL (ref 2.8–30.0)

## 2018-08-17 LAB — SARS CORONAVIRUS 2 BY RT PCR (HOSPITAL ORDER, PERFORMED IN ~~LOC~~ HOSPITAL LAB): SARS Coronavirus 2: NEGATIVE

## 2018-08-17 LAB — ACETAMINOPHEN LEVEL: Acetaminophen (Tylenol), Serum: <10 ug/mL — ABNORMAL LOW (ref 10–30)

## 2018-08-17 MED ORDER — FLUVOXAMINE MALEATE 50 MG PO TABS
100.0000 mg | ORAL_TABLET | Freq: Every day | ORAL | Status: DC
  Administered 2018-08-17 – 2018-08-22 (×6): 100 mg via ORAL
  Filled 2018-08-17 (×6): qty 2

## 2018-08-17 MED ORDER — LURASIDONE HCL 40 MG PO TABS
40.0000 mg | ORAL_TABLET | Freq: Every day | ORAL | Status: DC
  Administered 2018-08-18: 40 mg via ORAL
  Filled 2018-08-17: qty 1

## 2018-08-17 MED ORDER — ALBUTEROL SULFATE HFA 108 (90 BASE) MCG/ACT IN AERS
1.0000 | INHALATION_SPRAY | RESPIRATORY_TRACT | Status: DC | PRN
  Filled 2018-08-17: qty 6.7

## 2018-08-17 MED ORDER — ACETAMINOPHEN 325 MG PO TABS
650.0000 mg | ORAL_TABLET | Freq: Four times a day (QID) | ORAL | Status: DC | PRN
  Administered 2018-08-18 – 2018-08-23 (×5): 650 mg via ORAL
  Filled 2018-08-17 (×5): qty 2

## 2018-08-17 MED ORDER — OXCARBAZEPINE 300 MG PO TABS
600.0000 mg | ORAL_TABLET | Freq: Two times a day (BID) | ORAL | Status: DC
  Administered 2018-08-17 – 2018-08-23 (×12): 600 mg via ORAL
  Filled 2018-08-17 (×12): qty 2

## 2018-08-17 MED ORDER — LISDEXAMFETAMINE DIMESYLATE 70 MG PO CAPS
70.0000 mg | ORAL_CAPSULE | Freq: Every day | ORAL | Status: DC
  Administered 2018-08-18 – 2018-08-23 (×6): 70 mg via ORAL
  Filled 2018-08-17 (×6): qty 1

## 2018-08-17 NOTE — BH Assessment (Signed)
Assessment Note  Joel Arroyo is an 23 y.o. male who presents to ED endorsing suicidal ideas with a plan "to cut my wrist when no one is looking". Pt has hx of cutting to his forearms - pt noted "you can still see them on my arms". He reports last cutting "a month ago". Pt is currently receiving outpatient mental health treatment with [facility] where he receives therapy and medication management. He reports being med compliant. Pt is currently on unsupervised probation for past legal charges. Pt's reports having an inconsistent appetite "some days I want to eat and some days I don't"; however, he reports getting an adequate amount of sleep "8 hours". Pt denied HI/AVH. Pt was cooperative during assessment, while alert and oriented to person, place, and situation.    Diagnosis: Major Depressive Disorder  Past Medical History:  Past Medical History:  Diagnosis Date  . Anxiety   . Asthma   . Bipolar 1 disorder (HCC)   . Depression   . Seizures (HCC)     Past Surgical History:  Procedure Laterality Date  . HERNIA REPAIR      Family History:  Family History  Problem Relation Age of Onset  . Kidney cancer Neg Hx   . Kidney disease Neg Hx   . Prostate cancer Neg Hx     Social History:  reports that he has been smoking cigarettes. He has been smoking about 0.50 packs per day. He has never used smokeless tobacco. He reports that he does not drink alcohol or use drugs.  Additional Social History:  Alcohol / Drug Use Pain Medications: See PTA Prescriptions: See PTA Over the Counter: See PTA History of alcohol / drug use?: No history of alcohol / drug abuse Longest period of sobriety (when/how long): Reports of no past use Negative Consequences of Use: (Reports of no past use) Withdrawal Symptoms: (Reports of no past use)  CIWA: CIWA-Ar BP: 138/88 Pulse Rate: 78 COWS:    Allergies: No Known Allergies  Home Medications: (Not in a hospital admission)   OB/GYN Status:   No LMP for male patient.  General Assessment Data Location of Assessment: [facility] ED TTS Assessment: In system Is this a Tele or Face-to-Face Assessment?: Face-to-Face Is this an Initial Assessment or a Re-assessment for this encounter?: Initial Assessment Patient Accompanied by:: N/A Language Other than English: No Living Arrangements: Other (Comment)(Private Residence) What gender do you identify as?: Male Marital status: Single Maiden name: N/A Living Arrangements: Parent Can pt return to current living arrangement?: Yes Admission Status: Voluntary Is patient capable of signing voluntary admission?: No Referral Source: Self/Family/Friend Insurance type: [place] Medicaid  Medical Screening Exam [facility] Walk-in ONLY) Medical Exam completed: Yes  Crisis Care Plan Living Arrangements: Parent Legal Guardian: Other relative, Other:(Stepmother) Name of Psychiatrist: [facility] Name of Therapist: [facility]  Education Status Is patient currently in school?: No Is the patient employed, unemployed or receiving disability?: Receiving disability income  Risk to self with the past 6 months Suicidal Ideation: Yes-Currently Present Has patient been a risk to self within the past 6 months prior to admission? : Yes Suicidal Intent: Yes-Currently Present Has patient had any suicidal intent within the past 6 months prior to admission? : Yes Is patient at risk for suicide?: Yes Suicidal Plan?: Yes-Currently Present Has patient had any suicidal plan within the past 6 months prior to admission? : Yes Specify Current Suicidal Plan: "To cut myself" Access to Means: Yes Specify Access to Suicidal Means: Pt has  access to sharp objects What has been your use of drugs/alcohol within the last 12 months?: None Reported Previous Attempts/Gestures: Yes How many times?: 2 Other Self Harm Risks: Cutting Triggers for Past Attempts: Family contact Intentional Self Injurious Behavior:  Cutting Comment - Self Injurious Behavior: Pt reports cutting his arms Family Suicide History: Unknown Recent stressful life event(s): Conflict (Comment) Persecutory voices/beliefs?: No Depression: Yes Depression Symptoms: Despondent, Insomnia, Isolating, Feeling worthless/self pity, Feeling angry/irritable Substance abuse history and/or treatment for substance abuse?: No Suicide prevention information given to non-admitted patients: Not applicable  Risk to Others within the past 6 months Homicidal Ideation: No Does patient have any lifetime risk of violence toward others beyond the six months prior to admission? : No Thoughts of Harm to Others: No Current Homicidal Intent: No Current Homicidal Plan: No Access to Homicidal Means: No Identified Victim: None History of harm to others?: No Assessment of Violence: None Noted Violent Behavior Description: None Does patient have access to weapons?: No Criminal Charges Pending?: No Does patient have a court date: No Is patient on probation?: Yes(Unsupervised probation)  Psychosis Hallucinations: None noted Delusions: None noted  Mental Status Report Appearance/Hygiene: In scrubs Eye Contact: Good Motor Activity: Freedom of movement Speech: Logical/coherent Level of Consciousness: Alert Mood: Anxious Affect: Appropriate to circumstance Anxiety Level: Minimal Thought Processes: Coherent, Relevant Judgement: Unimpaired Orientation: Person, Place, Time, Situation Obsessive Compulsive Thoughts/Behaviors: None  Cognitive Functioning Concentration: Normal Memory: Recent Intact, Remote Intact Is patient IDD: No Insight: Poor Impulse Control: Poor Appetite: Fair Have you had any weight changes? : No Change Sleep: No Change Total Hours of Sleep: 8 Vegetative Symptoms: None  ADLScreening [facility] Assessment Services) Patient's cognitive ability adequate to safely complete daily activities?: Yes Patient able to express need for  assistance with ADLs?: Yes Independently performs ADLs?: Yes (appropriate for developmental age)  Prior Inpatient Therapy Prior Inpatient Therapy: Yes Prior Therapy Dates: Unable to recall [facility] Reason for Treatment: SI; Depression  Prior Outpatient Therapy Prior Outpatient Therapy: Yes Prior Therapy Dates: Current Prior Therapy Facilty/Provider(s): [facility] Reason for Treatment: SI; Medication Management Does patient have an ACCT team?: No Does patient have Intensive In-House Services?  : No Does patient have [facility] services? : No Does patient have P4CC services?: No  ADL Screening (condition at time of admission) Patient's cognitive ability adequate to safely complete daily activities?: Yes Patient able to express need for assistance with ADLs?: Yes Independently performs ADLs?: Yes (appropriate for developmental age)       Abuse/Neglect Assessment (Assessment to be complete while patient is alone) Abuse/Neglect Assessment Can Be Completed: Yes Physical Abuse: Denies Verbal Abuse: Denies Sexual Abuse: Denies Exploitation of patient/patient's resources: Denies Self-Neglect: Denies Values / Beliefs Cultural Requests During Hospitalization: None Spiritual Requests During Hospitalization: None Consults Spiritual Care Consult Needed: No Social Work Consult Needed: No   Nutrition Screen- [facility] Adult/WL/AP Patient's home diet: Regular     Child/Adolescent Assessment Running Away Risk: (Patient is an adult)  Disposition:  Disposition Initial Assessment Completed for this Encounter: Yes Disposition of Patient: Admit Type of inpatient treatment program: Adult Patient refused recommended treatment: No Mode of transportation if patient is discharged/movement?: N/A Patient referred to: Other (Comment)([facility] BMU)  On Site Evaluation by:   Reviewed with Physician:    Wilmon ArmsSTEVENSON,  08/17/2018 2:51 PM

## 2018-08-17 NOTE — ED Triage Notes (Signed)
Pt arrived to ED with legal guardian. Per legal guardian, pt has escalated with anger and lashed out at parents. Tonight, pt expressed SI. Pt is calm and cooperative. Pt reports SI but denies HI.

## 2018-08-17 NOTE — ED Notes (Signed)
Patient went to do US abdomen

## 2018-08-17 NOTE — ED Notes (Signed)
Per [place] from lab patient is negative for COVID-19. Lab is working on having the results appear on patient's chart.

## 2018-08-17 NOTE — ED Notes (Signed)
Hourly rounding reveals patient in room. No complaints, stable, in no acute distress. Q15 minute rounds and monitoring via Rover and Officer to continue.   

## 2018-08-17 NOTE — ED Provider Notes (Signed)
[facility] Emergency Department Provider Note   ____________________________________________   First MD Initiated Contact with Patient 08/17/18 [phone]     (approximate)  I have reviewed the triage vital signs and the nursing notes.   HISTORY  Chief Complaint Mental Health Problem    HPI Joel Arroyo is a 23 y.o. male patient got angry tonight per his legal guardian lashed out appearance.  He is now telling me he is thinking he might hurt himself by cutting his wrists.  He denies any homicidal ideation.  He has a history of bipolar illness.         Past Medical History:  Diagnosis Date  . Anxiety   . Asthma   . Bipolar 1 disorder (HCC)   . Depression   . Seizures [facility])     Patient Active Problem List   Diagnosis Date Noted  . Self-inflicted laceration of wrist 10/07/2017  . Cannabis abuse 10/07/2017  . Seizure-like activity (HCC)   . Psychosis (HCC)   . Seizures (HCC) 09/06/2016  . Bipolar 2 disorder, major depressive episode (HCC) 09/15/2015  . Suicidal ideation 09/15/2015  . Homicidal ideation 09/15/2015  . ADHD (attention deficit hyperactivity disorder) 09/14/2015  . Involuntary commitment 09/14/2015    Past Surgical History:  Procedure Laterality Date  . HERNIA REPAIR      Prior to Admission medications   Medication Sig Start Date End Date Taking? Authorizing Provider  Albuterol Sulfate 108 (90 Base) MCG/ACT AEPB Inhale 1 puff into the lungs every 4 (four) hours as needed.    [provider]  amoxicillin (AMOXIL) 500 MG capsule Take 1 capsule (500 mg total) by mouth 3 (three) times daily. Patient not taking: Reported on 04/24/2017 03/30/17   Irean Hong, MD  diazepam (DIASTAT ACUDIAL) 10 MG GEL Place 10 mg rectally once. Patient not taking: Reported on 04/24/2017 09/14/16 10/11/16  Sharman Cheek, MD  fluvoxaMINE (LUVOX) 100 MG tablet Take 1 tablet (100 mg total) by mouth at bedtime. 09/18/15   Pucilowska, Jolanta B, MD   ibuprofen (ADVIL,MOTRIN) 800 MG tablet Take 1 tablet by mouth every 6 (six) hours as needed.    [provider]  lisdexamfetamine (VYVANSE) 50 MG capsule Take 50 mg by mouth daily.    [provider]  lurasidone (LATUDA) 20 MG TABS tablet Take 20 mg by mouth daily.    [provider]  Oxcarbazepine (TRILEPTAL) 300 MG tablet Take 1 tablet (300 mg total) by mouth 2 (two) times daily. 09/18/15   Pucilowska, Ellin Goodie, MD    Allergies Patient has no known allergies.  Family History  Problem Relation Age of Onset  . Kidney cancer Neg Hx   . Kidney disease Neg Hx   . Prostate cancer Neg Hx     Social History Social History   Tobacco Use  . Smoking status: Current Every Day Smoker    Packs/day: 0.50    Types: Cigarettes  . Smokeless tobacco: Never Used  Substance Use Topics  . Alcohol use: No  . Drug use: No    Review of Systems  Constitutional: No fever/chills Eyes: No visual changes. ENT: No sore throat. Cardiovascular: Denies chest pain. Respiratory: Denies shortness of breath. Gastrointestinal: No abdominal pain.  No nausea, no vomiting.  No diarrhea.  No constipation. Genitourinary: Negative for dysuria. Musculoskeletal: Negative for back pain. Skin: Negative for rash. Neurological: Negative for headaches, focal weakness   ____________________________________________   PHYSICAL EXAM:  VITAL SIGNS: ED Triage Vitals  Enc Vitals Group     BP 08/17/18 0028 138/88     Pulse Rate 08/17/18 0028 78     Resp 08/17/18 0028 18     Temp 08/17/18 0028 98 F (36.7 C)     Temp Source 08/17/18 0028 Oral     SpO2 08/17/18 0028 98 %     Weight --      Height --      Head Circumference --      Peak Flow --      Pain Score 08/17/18 0058 0     Pain Loc --      Pain Edu? --      Excl. in GC? --    Constitutional: Alert and oriented. Well appearing and in no acute distress. Eyes: Conjunctivae are normal. PERRL. EOMI. Head: Atraumatic. Nose: No  congestion/rhinnorhea. Mouth/Throat: Mucous membranes are moist.  Oropharynx non-erythematous. Neck: No stridor Cardiovascular: Normal rate, regular rhythm. Grossly normal heart sounds.  Good peripheral circulation. Respiratory: Normal respiratory effort.  No retractions. Lungs CTAB. Gastrointestinal: Soft and nontender. No distention. No abdominal bruits. No CVA tenderness. }Musculoskeletal: No lower extremity tenderness nor edema.  Neurologic:  Normal speech and language. No gross focal neurologic deficits are appreciated. No gait instability. Skin:  Skin is warm, dry and intact. No rash noted.   ____________________________________________   LABS (all labs ordered are listed, but only abnormal results are displayed)  Labs Reviewed  COMPREHENSIVE METABOLIC PANEL - Abnormal; Notable for the following components:      Result Value   Glucose, Bld 114 (*)    BUN 21 (*)    AST 335 (*)    ALT 84 (*)    All other components within normal limits  ACETAMINOPHEN LEVEL - Abnormal; Notable for the following components:   Acetaminophen (Tylenol), Serum <10 (*)    All other components within normal limits  URINE DRUG SCREEN, QUALITATIVE ([facility] ONLY) - Abnormal; Notable for the following components:   Amphetamines, Ur Screen POSITIVE (*)    All other components within normal limits  ETHANOL  SALICYLATE LEVEL  CBC  HEPATITIS PANEL, ACUTE   ____________________________________________  EKG   ____________________________________________  RADIOLOGY  ED MD interpretation: Ultrasound read by radiology is negative  Official radiology report(s): [place]as Abdomen Limited Ruq  Result Date: 08/17/2018 CLINICAL DATA:  Elevated ALT EXAM: ULTRASOUND ABDOMEN LIMITED RIGHT UPPER QUADRANT COMPARISON:  None recent FINDINGS: Gallbladder: No gallstones or wall thickening visualized. No sonographic Murphy sign noted by sonographer. There is a 4.6 mm gallbladder polyp. Additional smaller gallbladder polyps  are noted. Per consensus statement, gallbladder polyps less than 6 mm are usually benign not requiring follow-up Common bile duct: Diameter: 0.5 cm Liver: No focal lesion identified. Within normal limits in parenchymal echogenicity. Portal vein is patent on color Doppler imaging with normal direction of blood flow towards the liver. IMPRESSION: No acute sonographic abnormality detected. Electronically Signed   By: Katherine Mantlehristopher  Green M.D.   On: 08/17/2018 03:00    ____________________________________________   PROCEDURES  Procedure(s) performed (including Critical Care):  Procedures   ____________________________________________   INITIAL IMPRESSION / ASSESSMENT AND PLAN / ED COURSE   Patient denies taking Tylenol.  Tylenol levels negative.  Ultrasound is negative.  Hepatitis panel still pending.  He is clear for psych evaluation.  His LFTs were have to be followed though.             ____________________________________________   FINAL CLINICAL IMPRESSION(S) / ED DIAGNOSES  Final diagnoses:  Elevated  ALT measurement  Depression, unspecified depression type     ED Discharge Orders    None       Note:  This document was prepared using Dragon voice recognition software and may include unintentional dictation errors.    Arnaldo Natal, MD 08/17/18 [phone]
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
## 2018-08-17 NOTE — ED Notes (Signed)
Pt discharged to BMU. Voluntary consent form signed by legal guardian. VS stable. Report given to [place], Charity fundraiser. Pt accepting of disposition. All belongings sent with patient.

## 2018-08-17 NOTE — ED Notes (Signed)
TTS stated the patient's step-mother (legal guardian) is coming to [facility] to sign consent for patient to receive inpatient treatment.

## 2018-08-17 NOTE — ED Notes (Signed)
Pt. Transferred from Triage to [facility] after dressing out and screening for contraband. Report to include Situation, Background, Assessment and Recommendations from triage RN. Pt. Oriented to Quad including Q15 minute rounds as well as Psychologist, counselling for their protection. Patient is alert and oriented, warm and dry in no acute distress. Patient reported SI without specific plan. Denied HI, and AVH. He has a hx of bipolar. Pt. Encouraged to let me know if needs arise.

## 2018-08-17 NOTE — ED Notes (Signed)
Snack and beverage given. 
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
## 2018-08-17 NOTE — BH Assessment (Addendum)
Patient is to be admitted to [facility] by Elenore Paddy, NP.  Attending Physician will be Dr. Toni Amend.   Patient has been assigned to room 307, by [facility] Charge Nurse Gwen.   Intake Paper Work has been signed and placed on patient chart.  ER staff is aware of the admission:  Glenda, ER Secretary    Dr. Mayford Knife, ER MD   Rhea/Amy B., Patient's Nurse   Ethelene Browns, Patient Access.   Patient's legal guardian Truett Perna: [phone]) came to ED to sign voluntary consent forms. Forms given to pt's nurse and placed on pt's ED chart.

## 2018-08-17 NOTE — Plan of Care (Signed)
  Problem: Education: Goal: Knowledge of [facility] Education information/materials will improve Note:  Instructed  patient  on [place] Educations , and unit programing ,verbalize understanding .

## 2018-08-18 DIAGNOSIS — F322 Major depressive disorder, single episode, severe without psychotic features: Secondary | ICD-10-CM

## 2018-08-18 MED ORDER — LURASIDONE HCL 40 MG PO TABS
40.0000 mg | ORAL_TABLET | Freq: Every day | ORAL | Status: DC
  Administered 2018-08-19 – 2018-08-22 (×4): 40 mg via ORAL
  Filled 2018-08-18 (×5): qty 1

## 2018-08-18 MED ORDER — NICOTINE 21 MG/24HR TD PT24
21.0000 mg | MEDICATED_PATCH | Freq: Every day | TRANSDERMAL | Status: DC
  Administered 2018-08-18 – 2018-08-23 (×6): 21 mg via TRANSDERMAL
  Filled 2018-08-18 (×6): qty 1

## 2018-08-18 NOTE — Tx Team (Addendum)
Interdisciplinary Treatment and Diagnostic Plan Update  08/18/2018 Time of Session: 230PM Ila Mcgillustin N Nodal MRN: [id number]  Principal Diagnosis: <principal problem not specified>  Secondary Diagnoses: Active Problems:   MDD (major depressive disorder), severe (HCC)   Current Medications:  Current Facility-Administered Medications  Medication Dose Route Frequency Provider Last Rate Last Dose  . acetaminophen (TYLENOL) tablet 650 mg  650 mg Oral Q6H PRN Catalina Gravelhomspon, Jacqueline, NP   650 mg at 08/18/18 0920  . albuterol (VENTOLIN HFA) 108 (90 Base) MCG/ACT inhaler 1 puff  1 puff Inhalation Q4H PRN Thomspon, Adela LankJacqueline, NP      . fluvoxaMINE (LUVOX) tablet 100 mg  100 mg Oral QHS Thomspon, Adela LankJacqueline, NP   100 mg at 08/17/18 2202  . lisdexamfetamine (VYVANSE) capsule 70 mg  70 mg Oral Daily Catalina Gravelhomspon, Jacqueline, NP   70 mg at 08/18/18 0920  . [START ON 08/19/2018] lurasidone (LATUDA) tablet 40 mg  40 mg Oral Q supper Clapacs, John T, MD      . nicotine (NICODERM CQ - dosed in mg/24 hours) patch 21 mg  21 mg Transdermal Daily Clapacs, Jackquline DenmarkJohn T, MD   21 mg at 08/18/18 0920  . Oxcarbazepine (TRILEPTAL) tablet 600 mg  600 mg Oral BID Catalina Gravelhomspon, Jacqueline, NP   600 mg at 08/18/18 0920   PTA Medications: Medications Prior to Admission  Medication Sig Dispense Refill Last Dose  . Albuterol Sulfate 108 (90 Base) MCG/ACT AEPB Inhale 1 puff into the lungs every 4 (four) hours as needed.   prn at prn  . fluvoxaMINE (LUVOX) 100 MG tablet Take 1 tablet (100 mg total) by mouth at bedtime. 30 tablet 0 unknown at unknown  . LATUDA 40 MG TABS tablet Take 40 mg by mouth daily with breakfast.    unknown at unknown  . oxcarbazepine (TRILEPTAL) 600 MG tablet Take 600 mg by mouth 2 (two) times daily.    unknown at unknown  . VYVANSE 70 MG capsule Take 70 mg by mouth daily.    unknown at unknown    Patient Stressors: Educational concerns Legal issue Medication change or noncompliance Traumatic event  Patient  Strengths: Ability for insight Physical Health Supportive family/friends  Treatment Modalities: Medication Management, Group therapy, Case management,  1 to 1 session with clinician, Psychoeducation, Recreational therapy.   Physician Treatment Plan for Primary Diagnosis: <principal problem not specified> Long Term Goal(s):     Short Term Goals:    Medication Management: Evaluate patient's response, side effects, and tolerance of medication regimen.  Therapeutic Interventions: 1 to 1 sessions, Unit Group sessions and Medication administration.  Evaluation of Outcomes: Progressing  Physician Treatment Plan for Secondary Diagnosis: Active Problems:   MDD (major depressive disorder), severe (HCC)  Long Term Goal(s):     Short Term Goals:       Medication Management: Evaluate patient's response, side effects, and tolerance of medication regimen.  Therapeutic Interventions: 1 to 1 sessions, Unit Group sessions and Medication administration.  Evaluation of Outcomes: Progressing   RN Treatment Plan for Primary Diagnosis: <principal problem not specified> Long Term Goal(s): Knowledge of disease and therapeutic regimen to maintain health will improve  Short Term Goals: Ability to remain free from injury will improve, Ability to verbalize frustration and anger appropriately will improve, Ability to demonstrate self-control, Ability to participate in decision making will improve and Ability to identify and develop effective coping behaviors will improve  Medication Management: RN will administer medications as ordered by provider, will assess and evaluate patient's response and provide  education to patient for prescribed medication. RN will report any adverse and/or side effects to prescribing provider.  Therapeutic Interventions: 1 on 1 counseling sessions, Psychoeducation, Medication administration, Evaluate responses to treatment, Monitor vital signs and CBGs as ordered,  Perform/monitor CIWA, COWS, AIMS and Fall Risk screenings as ordered, Perform wound care treatments as ordered.  Evaluation of Outcomes: Progressing   LCSW Treatment Plan for Primary Diagnosis: <principal problem not specified> Long Term Goal(s): Safe transition to appropriate next level of care at discharge, Engage patient in therapeutic group addressing interpersonal concerns.  Short Term Goals: Engage patient in aftercare planning with referrals and resources, Increase social support, Increase emotional regulation and Increase skills for wellness and recovery  Therapeutic Interventions: Assess for all discharge needs, 1 to 1 time with Social worker, Explore available resources and support systems, Assess for adequacy in community support network, Educate family and significant other(s) on suicide prevention, Complete Psychosocial Assessment, Interpersonal group therapy.  Evaluation of Outcomes: Progressing   Progress in Treatment: Attending groups: Yes. Participating in groups: Yes. Taking medication as prescribed: Yes. Toleration medication: Yes. Family/Significant other contact made: Yes, individual(s) contacted:  Pts step-mother and legal guardian Patient understands diagnosis: Yes. Discussing patient identified problems/goals with staff: Yes. Medical problems stabilized or resolved: Yes. Denies suicidal/homicidal ideation: Yes. Issues/concerns per patient self-inventory: No. Other: N/A  New problem(s) identified: No, Describe:  none  New Short Term/Long Term Goal(s):  medication management for mood stabilization; elimination of SI thoughts; development of comprehensive mental wellness/sobriety plan.   Patient Goals:  "Help my mom get around the house and get the help I need to go home to my family"  Discharge Plan or Barriers: SPE pamphlet, Mobile Crisis information, and AA/NA information provided to patient for additional community support and resources. Pt will continue to  follow up with [facility] for peer support and therapy, and has an appointment with [facility] in [place] for psychiatry on 08/27/2018 at 3:00PM.  Reason for Continuation of Hospitalization: Medication stabilization  Estimated Length of Stay: 5-7 days  Recreational Therapy: Patient Stressors: Family  Patient Goal: Patient will identify 3 positive coping skills strategies to use post d/c within 5 recreation therapy group sessions  Attendees: Patient: Joel Arroyo 08/18/2018 3:00 PM  Physician: Dr Toni Amend MD 08/18/2018 3:00 PM  Nursing: Doyce Para RN 08/18/2018 3:00 PM  RN Care Manager: 08/18/2018 3:00 PM  Social Worker:  Zollie Scale Moton LCSW 08/18/2018 3:00 PM  Recreational Therapist: Danella Deis Alexza Norbeck CTRS LRT 08/18/2018 3:00 PM  Other: Lowella Dandy LCSW 08/18/2018 3:00 PM  Other: Penni Homans LCSW 08/18/2018 3:00 PM  Other: 08/18/2018 3:00 PM    Scribe for Treatment Team: Charlann Lange Moton, LCSW 08/18/2018 3:00 PM
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
## 2018-08-18 NOTE — Progress Notes (Signed)
Recreation Therapy Notes  INPATIENT RECREATION THERAPY ASSESSMENT  Patient Details Name: Joel Arroyo MRN: [id number] DOB: 05/09/1995 Today's Date: 08/18/2018       Information Obtained From: Patient  Able to Participate in Assessment/Interview: Yes  Patient Presentation: Responsive  Reason for Admission (Per Patient): Active Symptoms, Suicidal Ideation, Aggressive/Threatening  Patient Stressors: Family  Coping Skills:   Other (Comment)(None)  Leisure Interests (2+):  Sports - Basketball, Games - Cards, Games - [employer], Games - Video games(Smoking weed)  Frequency of Recreation/Participation: Marketing executive Resources:  Yes  Community Resources:  Other (Comment)([employer] )  Current Use: Yes  If no, Barriers?: Other (Comment)(Patient feel like he can not trust his therapist. He feels she going back telling his family every thing)  Expressed Interest in [employer] Information:    [place] of Residence:  [employer]  Patient Main Form of Transportation: Other (Comment)(Peer support)  Patient Strengths:  Counselling psychologist, crafty, art  Patient Identified Areas of Improvement:   Understanding myself  Patient Goal for Hospitalization:  Not being suicidal anymore  Current SI (including self-harm):  No  Current HI:  No  Current AVH: No  Staff Intervention Plan: Group Attendance, Collaborate with Interdisciplinary Treatment Team  Consent to Intern Participation: N/A  Joel Arroyo 08/18/2018, 11:40 AM
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
## 2018-08-18 NOTE — [facility] Suicide Risk Assessment (Signed)
[facility]   Nursing information obtained from:  Patient Demographic factors:  Male, Adolescent or young adult, Unemployed, Caucasian Current Mental Status:  Self-harm thoughts, Self-harm behaviors Loss Factors:  NA Historical Factors:  Impulsivity Risk Reduction Factors:  Positive social support, Living with another person, especially a relative  Total Time spent with patient: 1 hour Principal Problem: <principal problem not specified> Diagnosis:  Active Problems:   MDD (major depressive disorder), severe (HCC)  Subjective Data: Patient seen and chart reviewed.  Patient with a known history of chronic depression and impulsivity was reporting suicidal ideation and threatening to cut himself with a knife.  Continues to report depression anxiety and irritability but no plan of harming himself in the hospital.  Denies any current psychotic symptoms.  No specific physical complaints.  Continued Clinical Symptoms:  Alcohol Use Disorder Identification Test Final Score (AUDIT): 0 The "Alcohol Use Disorders Identification Test", Guidelines for Use in Primary Care, Second Edition.  World Science writerHealth Organization [facility](WHO). Score between 0-7:  no or low risk or alcohol related problems. Score between 8-15:  moderate risk of alcohol related problems. Score between 16-19:  high risk of alcohol related problems. Score 20 or above:  warrants further diagnostic evaluation for alcohol dependence and treatment.   CLINICAL FACTORS:   Depression:   Impulsivity   Musculoskeletal: Strength & Muscle Tone: within normal limits Gait & Station: normal Patient leans: N/A  Psychiatric Specialty Exam: Physical Exam  Nursing note and vitals reviewed. Constitutional: He appears well-developed and well-nourished.  HENT:  Head: Normocephalic and atraumatic.  Eyes: Pupils are equal, round, and reactive to light. Conjunctivae are normal.  Neck: Normal range of motion.  Cardiovascular:  Regular rhythm and normal heart sounds.  Respiratory: Effort normal.  GI: Soft.  Musculoskeletal: Normal range of motion.  Neurological: He is alert.  Skin: Skin is warm and dry.  Psychiatric: Judgment normal. His affect is blunt. His speech is delayed. He is slowed. Cognition and memory are normal. He expresses suicidal ideation. He expresses no suicidal plans.    Review of Systems  Constitutional: Negative.   HENT: Negative.   Eyes: Negative.   Respiratory: Negative.   Cardiovascular: Negative.   Gastrointestinal: Negative.   Musculoskeletal: Negative.   Skin: Negative.   Neurological: Negative.   Psychiatric/Behavioral: Positive for depression and suicidal ideas. Negative for hallucinations, memory loss and substance abuse. The patient is nervous/anxious and has insomnia.     Blood pressure 124/88, pulse 70, temperature 98.5 F (36.9 C), temperature source Oral, resp. rate 18, height 6\' 1"  (1.854 m), weight 79.4 kg, SpO2 98 %.Body mass index is 23.09 kg/m.  General Appearance: Casual  Eye Contact:  Fair  Speech:  Slow  Volume:  Decreased  Mood:  Depressed  Affect:  Congruent  Thought Process:  Coherent  Orientation:  Full (Time, Place, and Person)  Thought Content:  Logical  Suicidal Thoughts:  Yes.  without intent/plan  Homicidal Thoughts:  No  Memory:  Immediate;   Fair Recent;   Fair Remote;   Fair  Judgement:  Fair  Insight:  Fair  Psychomotor Activity:  Decreased  Concentration:  Concentration: Fair  Recall:  [employer]Fair  Fund of Knowledge:  Fair  Language:  Fair  Akathisia:  No  Handed:  Right  AIMS (if indicated):     Assets:  Desire for Improvement Housing Physical Health  ADL's:  Intact  Cognition:  WNL  Sleep:  Number of Hours: 7.5      COGNITIVE  FEATURES THAT CONTRIBUTE TO RISK:  Thought constriction (tunnel vision)    SUICIDE RISK:   Mild:  Suicidal ideation of limited frequency, intensity, duration, and specificity.  There are no identifiable  plans, no associated intent, mild dysphoria and related symptoms, good self-control (both objective and subjective assessment), few other risk factors, and identifiable protective factors, including available and accessible social support.  PLAN OF CARE: Patient admitted to the hospital.  15-minute check precautions.  Medication management for bipolar depression.  Engagement in individual and group therapy along with ongoing assessment of suicidality before arranging for a discharge plan  I certify that inpatient services furnished can reasonably be expected to improve the patient's condition.   Mordecai Rasmussen, MD 08/18/2018, 3:45 PM

## 2018-08-18 NOTE — Progress Notes (Signed)
Patient alert and oriented x 4, affect is flat but she brightens upon approach, no distress noted, thoughts are organized and coherent, interacting appropriately with peers and staff, compliant with prescribed medication regimen. 15 minutes safety checks maintained will continue to monitor

## 2018-08-18 NOTE — Progress Notes (Signed)
Recreation Therapy Notes    Date: 08/18/2018  Time: 9:30 am  Location: Craft room  Behavioral response: Appropriate   Intervention Topic: Stress  Discussion/Intervention:  Group content on today was focused on stress. The group defined stress and way to cope with stress. Participants expressed how they know when they are stresses out. Individuals described the different ways they have to cope with stress. The group stated reasons why it is important to cope with stress. Patient explained what good stress is and some examples. The group participated in the intervention "Exploring Stress Management". Individuals were separated into two group and answered questions related to stress.   Clinical Observations/Feedback:  Patient came to group stated he thinks of arguments when he hears the word stress. He defined stress as how you are feeling. Participant stated that he normally isolated and suppress things when he is stressed. Patient explained that he talks fast when he is stress and that is a warning sign he is stressed. Individual was social with staff while participating in the intervention. Arlone Lenhardt LRT/CTRS         Joel Arroyo 08/18/2018 11:28 AM
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
## 2018-08-18 NOTE — B[facility] Group Notes (Signed)
LCSW Group Therapy Note  08/18/2018 1:00 PM  Type of Therapy/Topic:  Group Therapy:  Emotion Regulation  Participation Level:  Active   Description of Group:   The purpose of this group is to assist patients in learning to regulate negative emotions and experience positive emotions. Patients will be guided to discuss ways in which they have been vulnerable to their negative emotions. These vulnerabilities will be juxtaposed with experiences of positive emotions or situations, and patients will be challenged to use positive emotions to combat negative ones. Special emphasis will be placed on coping with negative emotions in conflict situations, and patients will process healthy conflict resolution skills.  Therapeutic Goals: 1. Patient will identify two positive emotions or experiences to reflect on in order to balance out negative emotions 2. Patient will label two or more emotions that they find the most difficult to experience 3. Patient will demonstrate positive conflict resolution skills through discussion and/or role plays  Summary of Patient Progress: Patient was active and attentive in group. Patient actively engaged in group discussions. Patient required some redirection as he would often interrupt.  Patient created his own analogies and metaphors to support himself in the discussion and increase understanding.  CSW did not understand some of the analogies, however, patient was able to reframe in a way that later provided some clarification.    Therapeutic Modalities:   Cognitive Behavioral Therapy Feelings Identification Dialectical Behavioral Therapy  Penni Homans, MSW, LCSW 08/18/2018 11:32 AM

## 2018-08-18 NOTE — Plan of Care (Signed)
  Problem: Medication: Goal: Compliance with prescribed medication regimen will improve Outcome: Progressing  Patient compliant with medication regimen this shift.

## 2018-08-19 NOTE — [facility] Group Notes (Signed)
Balance In Life 08/19/2018 1PM  Type of Therapy/Topic:  Group Therapy:  Balance in Life  Participation Level:  Active  Description of Group:   This group will address the concept of balance and how it feels and looks when one is unbalanced. Patients will be encouraged to process areas in their lives that are out of balance and identify reasons for remaining unbalanced. Facilitators will guide patients in utilizing problem-solving interventions to address and correct the stressor making their life unbalanced. Understanding and applying boundaries will be explored and addressed for obtaining and maintaining a balanced life. Patients will be encouraged to explore ways to assertively make their unbalanced needs known to significant others in their lives, using other group members and facilitator for support and feedback.  Therapeutic Goals: 1. Patient will identify two or more emotions or situations they have that consume much of in their lives. 2. Patient will identify signs/triggers that life has become out of balance:  3. Patient will identify two ways to set boundaries in order to achieve balance in their lives:  4. Patient will demonstrate ability to communicate their needs through discussion and/or role plays  Summary of Patient Progress: Actively and appropriately engaged in the group. Patient was able to provide support and validation to other group members. Patient discussed with group family discord but ways in which he will work to improve communication with his family to find balance in life.    Therapeutic Modalities:   Cognitive Behavioral Therapy Solution-Focused Therapy Assertiveness Training  Stacia Feazell Philip Aspen, LCSW
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
## 2018-08-19 NOTE — Plan of Care (Signed)
Patient aware of information  with [facility] , instructed on unit programing . Emotional and mental status  improvement  noted . Staff continue to redirect information and given in concrete formate for better understanding Able to attend activities  on the unit . No anger or frustration noted by patient .  Voice no concerns around sleep. Working on coping  and decision making .  Problem: Education: Goal: Knowledge of  General Education information/materials will improve Outcome: Progressing Goal: Emotional status will improve Outcome: Progressing Goal: Mental status will improve Outcome: Progressing Goal: Verbalization of understanding the information provided will improve Outcome: Progressing   Problem: Coping: Goal: Coping ability will improve Outcome: Progressing Goal: Will verbalize feelings Outcome: Progressing   Problem: Medication: Goal: Compliance with prescribed medication regimen will improve Outcome: Progressing

## 2018-08-19 NOTE — Progress Notes (Signed)
[facility]  08/19/2018 3:11 PM Joel Arroyo  MRN:  [id number] Subjective: Follow-up for this patient with depression.  Patient is participating in groups.  He is appropriate in his interactions.  This morning he tells me that he was troubled by nightmares last night.  They were not about anything that he could relate to his real life and he has no insight about them.  He says he usually does not have nightmares.  Mood today is still anxious dysphoric mild suicidal thoughts without specific intent.  He is taking care of his basic ADLs and tolerating medicine without difficulty.  No physical complaints Principal Problem: MDD (major depressive disorder), severe (HCC) Diagnosis: Principal Problem:   MDD (major depressive disorder), severe (HCC) Active Problems:   ADHD (attention deficit hyperactivity disorder)   Suicidal ideation   Self-inflicted laceration of wrist  Total Time spent with patient: 30 minutes  Past Psychiatric History: Patient has a history of multiple hospitalizations recurrent mood instability and self injury.  Past Medical History:  Past Medical History:  Diagnosis Date  . Anxiety   . Asthma   . Bipolar 1 disorder (HCC)   . Depression   . Seizures (HCC)     Past Surgical History:  Procedure Laterality Date  . HERNIA REPAIR     Family History:  Family History  Problem Relation Age of Onset  . Kidney cancer Neg Hx   . Kidney disease Neg Hx   . Prostate cancer Neg Hx    Family Psychiatric  History: Little known about biological family Social History:  Social History   Substance and Sexual Activity  Alcohol Use No     Social History   Substance and Sexual Activity  Drug Use No    Social History   Socioeconomic History  . Marital status: Single    Spouse name: Not on file  . Number of children: Not on file  . Years of education: Not on file  . Highest education level: Not on file  Occupational History  . Not on file  Social Needs  .  Financial resource strain: Not on file  . Food insecurity:    Worry: Not on file    Inability: Not on file  . Transportation needs:    Medical: Not on file    Non-medical: Not on file  Tobacco Use  . Smoking status: Current Every Day Smoker    Packs/day: 0.50    Types: Cigarettes  . Smokeless tobacco: Never Used  Substance and Sexual Activity  . Alcohol use: No  . Drug use: No  . Sexual activity: Not on file  Lifestyle  . Physical activity:    Days per week: Not on file    Minutes per session: Not on file  . Stress: Not on file  Relationships  . Social connections:    Talks on phone: Not on file    Gets together: Not on file    Attends religious service: Not on file    Active member of club or organization: Not on file    Attends meetings of clubs or organizations: Not on file    Relationship status: Not on file  Other Topics Concern  . Not on file  Social History Narrative  . Not on file   Additional Social History:                         Sleep: Poor  Appetite:  Fair  Current  Medications: Current Facility-Administered Medications  Medication Dose Route Frequency Provider Last Rate Last Dose  . acetaminophen (TYLENOL) tablet 650 mg  650 mg Oral Q6H PRN Catalina Gravel, NP   650 mg at 08/18/18 0920  . albuterol (VENTOLIN HFA) 108 (90 Base) MCG/ACT inhaler 1 puff  1 puff Inhalation Q4H PRN Thomspon, Adela Lank, NP      . fluvoxaMINE (LUVOX) tablet 100 mg  100 mg Oral QHS Thomspon, Adela Lank, NP   100 mg at 08/18/18 2133  . lisdexamfetamine (VYVANSE) capsule 70 mg  70 mg Oral Daily Catalina Gravel, NP   70 mg at 08/19/18 0816  . lurasidone (LATUDA) tablet 40 mg  40 mg Oral Q supper ,  T, MD      . nicotine (NICODERM CQ - dosed in mg/24 hours) patch 21 mg  21 mg Transdermal Daily , Jackquline Denmark, MD   21 mg at 08/19/18 0818  . Oxcarbazepine (TRILEPTAL) tablet 600 mg  600 mg Oral BID Catalina Gravel, NP   600 mg at 08/19/18 9892     Lab Results: No results found for this or any previous visit (from the past 48 hour(s)).  Blood Alcohol level:  Lab Results  Component Value Date   ETH <10 08/17/2018   ETH <10 08/10/2017    Metabolic Disorder Labs: Lab Results  Component Value Date   HGBA1C 5.3 09/05/2016   MPG 105 09/05/2016   No results found for: PROLACTIN No results found for: CHOL, TRIG, HDL, CHOLHDL, VLDL, LDLCALC  Physical Findings: AIMS:  , ,  ,  ,    CIWA:    COWS:     Musculoskeletal: Strength & Muscle Tone: within normal limits Gait & Station: normal Patient leans: N/A  Psychiatric Specialty Exam: Physical Exam  Nursing note and vitals reviewed. Constitutional: He appears well-developed and well-nourished.  HENT:  Head: Normocephalic and atraumatic.  Eyes: Pupils are equal, round, and reactive to light. Conjunctivae are normal.  Neck: Normal range of motion.  Cardiovascular: Regular rhythm and normal heart sounds.  Respiratory: Effort normal.  GI: Soft.  Musculoskeletal: Normal range of motion.  Neurological: He is alert.  Skin: Skin is warm and dry.  Psychiatric: His affect is blunt. His speech is delayed. He is slowed. He expresses impulsivity. He expresses no homicidal and no suicidal ideation. He exhibits abnormal recent memory.    Review of Systems  Constitutional: Negative.   HENT: Negative.   Eyes: Negative.   Respiratory: Negative.   Cardiovascular: Negative.   Gastrointestinal: Negative.   Musculoskeletal: Negative.   Skin: Negative.   Neurological: Negative.   Psychiatric/Behavioral: Positive for depression. Negative for hallucinations, substance abuse and suicidal ideas. The patient is nervous/anxious and has insomnia.     Blood pressure 125/83, pulse 78, temperature 97.9 F (36.6 C), temperature source Oral, resp. rate 17, height 6\' 1"  (1.854 m), weight 79.4 kg, SpO2 97 %.Body mass index is 23.09 kg/m.  General Appearance: Casual  Eye Contact:  Fair  Speech:   Clear and Coherent  Volume:  Normal  Mood:  Anxious and Depressed  Affect:  Constricted  Thought Process:  Goal Directed  Orientation:  Full (Time, Place, and Person)  Thought Content:  Logical  Suicidal Thoughts:  Yes.  without intent/plan  Homicidal Thoughts:  No  Memory:  Immediate;   Fair Recent;   Fair Remote;   Fair  Judgement:  Impaired  Insight:  Shallow  Psychomotor Activity:  Decreased  Concentration:  Concentration: Fair  Recall:  Fair  Fund of Knowledge:  Fair  Language:  Fair  Akathisia:  No  Handed:  Right  AIMS (if indicated):     Assets:  Desire for Improvement Housing Physical Health Resilience Social Support  ADL's:  Intact  Cognition:  Impaired,  Mild  Sleep:  Number of Hours: 7     Treatment Plan Summary: Daily contact with patient to assess and evaluate symptoms and progress in treatment, Medication management and Plan We talked about his nightmares and I tried to see if we could relate them to anything currently worrying him but he could not.  Patient is not terribly troubled by them.  I told him that if they were not a common occurrence I would not jump into adding another medicine for them at this point.  Continue current psychiatric medicine as previously prescribed.  Praised and encourage his attendance at groups.  Work with him on establishing goals for controlling his mood symptoms in the future.  Mordecai Rasmussen, MD 08/19/2018, 3:11 PM

## 2018-08-19 NOTE — Progress Notes (Signed)
D: Patient stated slept poor last night .Stated appetite is good and energy level  Is normal. Stated concentration is good . Stated on Depression scale 5, hopeless 0 and anxiety 5 .( low 0-10 high) Denies suicidal  homicidal ideations  .  No auditory hallucinations  No pain concerns . Appropriate ADL'S. Interacting with peers and staff. Patient aware of information  with Felida , instructed on unit programing . Emotional and mental status  improvement  noted . Staff continue to redirect information and given in concrete formate for better understanding Able to attend activities  on the unit . No anger or frustration noted by patient .  Voice no concerns around sleep. Working on coping  and decision making .  A: Encourage patient participation with unit programming . Instruction  Given on  Medication , verbalize understanding.  R: Voice no other concerns. Staff continue to monitor

## 2018-08-19 NOTE — Plan of Care (Signed)
  Problem: Medication: Goal: Compliance with prescribed medication regimen will improve Outcome: Progressing  Patient compliant with prescribed medication regimen.  

## 2018-08-19 NOTE — Progress Notes (Signed)
Recreation Therapy Notes  Date: 08/19/2018  Time: 9:30 am  Location: Craft room  Behavioral response: Appropriate   Intervention Topic: Time Management   Discussion/Intervention:  Group content today was focused on time management. The group defined time management and identified healthy ways to manage time. Individuals expressed how much of the 24 hours they use in a day. Patients expressed how much time they use just for themselves personally. The group expressed how they have managed their time in the past. Individuals participated in the intervention "Managing Life" where they had a chance to see how much of the 24 hours they use and where it goes.  Clinical Observations/Feedback:  Patient came to group and defined time management as keeping up with what you are doing. He explained he manages his time by taking breaks. Participant expressed that time management reduces stress. Individual was social with staff while participating in the intervention. Raetta Agostinelli LRT/CTRS           Stefana Lodico 08/19/2018 12:34 PM

## 2018-08-19 NOTE — [facility] Group Notes (Signed)
[facility] Group Notes:  (Nursing/MHT/Case Management/Adjunct)  Date:  08/19/2018  Time:  8:53 PM  Type of Therapy:  Group Therapy  Participation Level:  Active  Participation Quality:  Sharing  Affect:  Excited  Cognitive:  Alert  Insight:  Good  Engagement in Group:  Engaged  Modes of Intervention:  Support  Summary of Progress/Problems:  Mayra Neer 08/19/2018, 8:53 PM

## 2018-08-20 NOTE — [facility] Group Notes (Signed)
[facility] Group Notes:  (Nursing/MHT/Case Management/Adjunct)  Date:  08/20/2018  Time:  3:24 PM  Type of Therapy:  Psychoeducational Skills  Participation Level:  Active  Participation Quality:  Appropriate, Attentive and Sharing  Affect:  Appropriate  Cognitive:  Alert and Appropriate  Insight:  Appropriate  Engagement in Group:  Engaged  Modes of Intervention:  Discussion, Education and Support  Summary of Progress/Problems:  Lynelle Smoke Gust Eugene 08/20/2018, 3:24 PM

## 2018-08-20 NOTE — Progress Notes (Signed)
Recreation Therapy Notes   Date: 08/20/2018  Time: 9:30 am  Location: Craft room  Behavioral response: Appropriate   Intervention Topic: Leisure   Discussion/Intervention:  Group content today was focused on leisure. The group defined what leisure is and some positive leisure activities they participate in. Individuals identified the difference between good and bad leisure. Participants expressed how they feel after participating in the leisure of their choice. The group discussed how they go about picking a leisure activity and if others are involved in their leisure activities. The patient stated how many leisure activities they too choose from and reasons why it is important to have leisure time. Individuals participated in the intervention "Exploration of Leisure" where they had a chance to identify new leisure activities as well as benefits of leisure.  Clinical Observations/Feedback:  Patient came to group and defined leisure as what you like to do in your free time.She identified the leisure activities she enjoys are watching TV and reading.  Individual was social with staff and peers while participating in the intervention. Christain Mcraney LRT/CTRS         Corderius Saraceni 08/20/2018 1:05 PM

## 2018-08-20 NOTE — Plan of Care (Signed)
Patient stated that he feels better today.Denies SI,HI and AVH.Appropriate in the unit.Attended groups.Compliant with medications.Appetite and energy level good.Support and encouragement given.

## 2018-08-20 NOTE — [facility] Group Notes (Signed)
LCSW Group Therapy Note  08/20/2018 12:31 PM  Type of Therapy and Topic:  Group Therapy:  Feelings around Relapse and Recovery  Participation Level:  Active   Description of Group:    Patients in this group will discuss emotions they experience before and after a relapse. They will process how experiencing these feelings, or avoidance of experiencing them, relates to having a relapse. Facilitator will guide patients to explore emotions they have related to recovery. Patients will be encouraged to process which emotions are more powerful. They will be guided to discuss the emotional reaction significant others in their lives may have to their relapse or recovery. Patients will be assisted in exploring ways to respond to the emotions of others without this contributing to a relapse.  Therapeutic Goals: 1. Patient will identify two or more emotions that lead to a relapse for them 2. Patient will identify two emotions that result when they relapse 3. Patient will identify two emotions related to recovery 4. Patient will demonstrate ability to communicate their needs through discussion and/or role plays   Summary of Patient Progress: Pt was appropriate and respectful in group. Pt was able to identify a relapse as "back tracking" and "screwing up". Pt reported relapse is a normal part of recovery because it helps you learn and recognize how you messed up and get back to how you were before. Pt related relapse to a movie named Wonder and reported how a Editor, commissioning on the movie had relapsed from bullying.    Therapeutic Modalities:   Cognitive Behavioral Therapy Solution-Focused Therapy Assertiveness Training Relapse Prevention Therapy   Iris Pert, MSW, LCSW Clinical Social Work 08/20/2018 12:31 PM

## 2018-08-20 NOTE — Plan of Care (Signed)
Patient is stable and adjusting comfortably, maintaining unit safety guide lines, takes his medications and no noticeable side effects , patient denies any SI/HI/AVH and no signs of delusions or depressions , patient denies any physical concerns, socialize well with peers and attends groups with active participation, patient express no concerns only requiring 15 minutes safety checks for safety.   Problem: Education: Goal: Knowledge of [facility] Education information/materials will improve Outcome: Progressing Goal: Emotional status will improve Outcome: Progressing Goal: Mental status will improve Outcome: Progressing Goal: Verbalization of understanding the information provided will improve Outcome: Progressing   Problem: Coping: Goal: Coping ability will improve Outcome: Progressing Goal: Will verbalize feelings Outcome: Progressing   Problem: Medication: Goal: Compliance with prescribed medication regimen will improve Outcome: Progressing

## 2018-08-20 NOTE — Progress Notes (Signed)
[facility] MD Progress Note  08/20/2018 3:44 PM Joel Arroyo  MRN:  [id number] Subjective: Follow-up patient with chronic depression.  He reports he is feeling better.  He is participating in groups and activities.  His attention is improved.  He is not acting out or feeling agitated or impulsive.  Denies suicidal thoughts.  No thoughts about hurting himself. Principal Problem: MDD (major depressive disorder), severe (HCC) Diagnosis: Principal Problem:   MDD (major depressive disorder), severe (HCC) Active Problems:   ADHD (attention deficit hyperactivity disorder)   Suicidal ideation   Self-inflicted laceration of wrist  Total Time spent with patient: 30 minutes  Past Psychiatric History: Patient has a history of recurrent episodes of impulsive emotional acting out  Past Medical History:  Past Medical History:  Diagnosis Date  . Anxiety   . Asthma   . Bipolar 1 disorder (HCC)   . Depression   . Seizures (HCC)     Past Surgical History:  Procedure Laterality Date  . HERNIA REPAIR     Family History:  Family History  Problem Relation Age of Onset  . Kidney cancer Neg Hx   . Kidney disease Neg Hx   . Prostate cancer Neg Hx    Family Psychiatric  History: See previous.  Adopted with not a lot of information about birth family Social History:  Social History   Substance and Sexual Activity  Alcohol Use No     Social History   Substance and Sexual Activity  Drug Use No    Social History   Socioeconomic History  . Marital status: Single    Spouse name: Not on file  . Number of children: Not on file  . Years of education: Not on file  . Highest education level: Not on file  Occupational History  . Not on file  Social Needs  . Financial resource strain: Not on file  . Food insecurity:    Worry: Not on file    Inability: Not on file  . Transportation needs:    Medical: Not on file    Non-medical: Not on file  Tobacco Use  . Smoking status: Current Every Day  Smoker    Packs/day: 0.50    Types: Cigarettes  . Smokeless tobacco: Never Used  Substance and Sexual Activity  . Alcohol use: No  . Drug use: No  . Sexual activity: Not on file  Lifestyle  . Physical activity:    Days per week: Not on file    Minutes per session: Not on file  . Stress: Not on file  Relationships  . Social connections:    Talks on phone: Not on file    Gets together: Not on file    Attends religious service: Not on file    Active member of club or organization: Not on file    Attends meetings of clubs or organizations: Not on file    Relationship status: Not on file  Other Topics Concern  . Not on file  Social History Narrative  . Not on file   Additional Social History:                         Sleep: Fair  Appetite:  Fair  Current Medications: Current Facility-Administered Medications  Medication Dose Route Frequency Provider Last Rate Last Dose  . acetaminophen (TYLENOL) tablet 650 mg  650 mg Oral Q6H PRN Catalina Gravel, NP   650 mg at 08/19/18 1622  . albuterol (VENTOLIN  HFA) 108 (90 Base) MCG/ACT inhaler 1 puff  1 puff Inhalation Q4H PRN Catalina Gravelhomspon, Jacqueline, NP      . fluvoxaMINE (LUVOX) tablet 100 mg  100 mg Oral QHS Thomspon, Adela LankJacqueline, NP   100 mg at 08/19/18 2138  . lisdexamfetamine (VYVANSE) capsule 70 mg  70 mg Oral Daily Catalina Gravelhomspon, Jacqueline, NP   70 mg at 08/20/18 0801  . lurasidone (LATUDA) tablet 40 mg  40 mg Oral Q supper Mazey Mantell T, MD   40 mg at 08/19/18 1623  . nicotine (NICODERM CQ - dosed in mg/24 hours) patch 21 mg  21 mg Transdermal Daily Verania Salberg, Jackquline DenmarkJohn T, MD   21 mg at 08/20/18 0801  . Oxcarbazepine (TRILEPTAL) tablet 600 mg  600 mg Oral BID Catalina Gravelhomspon, Jacqueline, NP   600 mg at 08/20/18 [id number]    Lab Results: No results found for this or any previous visit (from the past 48 hour(s)).  Blood Alcohol level:  Lab Results  Component Value Date   ETH <10 08/17/2018   ETH <10 08/10/2017    Metabolic Disorder  Labs: Lab Results  Component Value Date   HGBA1C 5.3 09/05/2016   MPG 105 09/05/2016   No results found for: PROLACTIN No results found for: CHOL, TRIG, HDL, CHOLHDL, VLDL, LDLCALC  Physical Findings: AIMS:  , ,  ,  ,    CIWA:    COWS:     Musculoskeletal: Strength & Muscle Tone: within normal limits Gait & Station: normal Patient leans: N/A  Psychiatric Specialty Exam: Physical Exam  Nursing note and vitals reviewed. Constitutional: He appears well-developed and well-nourished.  HENT:  Head: Normocephalic and atraumatic.  Eyes: Pupils are equal, round, and reactive to light. Conjunctivae are normal.  Neck: Normal range of motion.  Cardiovascular: Regular rhythm and normal heart sounds.  Respiratory: Effort normal. No respiratory distress.  GI: Soft.  Musculoskeletal: Normal range of motion.  Neurological: He is alert.  Skin: Skin is warm and dry.  Psychiatric: He has a normal mood and affect. His speech is delayed. He is slowed. Cognition and memory are impaired. He expresses impulsivity. He expresses no suicidal ideation.    Review of Systems  Constitutional: Negative.   HENT: Negative.   Eyes: Negative.   Respiratory: Negative.   Cardiovascular: Negative.   Gastrointestinal: Negative.   Musculoskeletal: Negative.   Skin: Negative.   Neurological: Negative.   Psychiatric/Behavioral: Negative for depression, hallucinations, memory loss, substance abuse and suicidal ideas. The patient is nervous/anxious and has insomnia.     Blood pressure 137/79, pulse 86, temperature 97.8 F (36.6 C), temperature source Oral, resp. rate 16, height 6\' 1"  (1.854 m), weight 79.4 kg, SpO2 98 %.Body mass index is 23.09 kg/m.  General Appearance: Casual  Eye Contact:  Fair  Speech:  Clear and Coherent  Volume:  Normal  Mood:  Euthymic  Affect:  Constricted  Thought Process:  Goal Directed  Orientation:  Full (Time, Place, and Person)  Thought Content:  Logical  Suicidal  Thoughts:  No  Homicidal Thoughts:  No  Memory:  Immediate;   Fair Recent;   Fair Remote;   Fair  Judgement:  Impaired  Insight:  Fair  Psychomotor Activity:  Normal  Concentration:  Concentration: Fair  Recall:  [employer]Fair  Fund of Knowledge:  Fair  Language:  Fair  Akathisia:  No  Handed:  Right  AIMS (if indicated):     Assets:  Desire for Improvement Housing Physical Health Social Support  ADL's:  Intact  Cognition:  WNL  Sleep:  Number of Hours: 6.5     Treatment Plan Summary: Daily contact with patient to assess and evaluate symptoms and progress in treatment, Medication management and Plan Tolerating medication.  Cooperative with treatment.  Insight is good.  Attends groups and activities appropriately.  Affect upbeat.  Denies suicidal thoughts.  Patient is consolidating his gains and will likely be ready for discharge at the end of the weekend.  Continue supportive counseling.  Mordecai Rasmussen, MD 08/20/2018, 3:44 PM

## 2018-08-21 NOTE — [facility] Group Notes (Signed)
LCSW Group Therapy Note  08/21/2018 1:15pm  Type of Therapy and Topic:  Group Therapy:  Cognitive Distortions  Participation Level:  Active   Description of Group:    Patients in this group will be introduced to the topic of cognitive distortions.  Patients will identify and describe cognitive distortions, describe the feelings these distortions create for them.  Patients will identify one or more situations in their personal life where they have cognitively distorted thinking and will verbalize challenging this cognitive distortion through positive thinking skills.  Patients will practice the skill of using positive affirmations to challenge cognitive distortions using affirmation cards.    Therapeutic Goals:  1. Patient will identify two or more cognitive distortions they have used 2. Patient will identify one or more emotions that stem from use of a cognitive distortion 3. Patient will demonstrate use of a positive affirmation to counter a cognitive distortion through discussion and/or role play. 4. Patient will describe one way cognitive distortions can be detrimental to wellness   Summary of Patient Progress: The patient reported that he feels "good". Patients were introduced to the topic of cognitive distortions. The patient was able to identify and describe cognitive distortions and the feelings these distortions create for him. Patient identified a situation in his personal life where he has cognitively distorted thinking and verbalized and challenged this cognitive distortion through positive thinking skills. Patient was able to provide support and validation to other group members      Therapeutic Modalities:   Cognitive Behavioral Therapy Motivational Interviewing   Johnnye Sima, LCSW 08/21/2018 12:39 PM

## 2018-08-21 NOTE — Progress Notes (Signed)
Patient alert and oriented x 4, affect is blunted , he denies SI/HI/AVH, he  appears  hyperactive, restless and anxious, speech is pressured and sometimes tangential, not  interacting with peers inappropriately. Patient currently denies SI/HI/AVH, he was offered support and encouragement, and was compliant with prescribed medication regimen. . 15 minutes safety checks maintained will continue to monitor.

## 2018-08-21 NOTE — Progress Notes (Signed)
[facility]  08/21/2018 12:05 PM Joel Arroyo  MRN:  [id number] Subjective: Patient seen and chart reviewed.  Young man with depression.  He tells me that yesterday evening he had a "episode" at night in which he lost his temper with another patient during a card game and threw some cards across the table.  He regrets this now.  Knows that he needs to work on some of his anger.  Overall mood today remains improved.  Denies hallucinations.  Denies suicidal ideation.  He still reports that he is having nightmares frequently at night but seems to be clocking in a reasonable amount of sleep and not sleepy during the day.  Still has no idea what these dreams might be about. Principal Problem: MDD (major depressive disorder), severe (HCC) Diagnosis: Principal Problem:   MDD (major depressive disorder), severe (HCC) Active Problems:   ADHD (attention deficit hyperactivity disorder)   Suicidal ideation   Self-inflicted laceration of wrist  Total Time spent with patient: 30 minutes  Past Psychiatric History: Patient has a history of multiple hospitalizations depression mood instability ADHD  Past Medical History:  Past Medical History:  Diagnosis Date  . Anxiety   . Asthma   . Bipolar 1 disorder (HCC)   . Depression   . Seizures (HCC)     Past Surgical History:  Procedure Laterality Date  . HERNIA REPAIR     Family History:  Family History  Problem Relation Age of Onset  . Kidney cancer Neg Hx   . Kidney disease Neg Hx   . Prostate cancer Neg Hx    Family Psychiatric  History: None reported Social History:  Social History   Substance and Sexual Activity  Alcohol Use No     Social History   Substance and Sexual Activity  Drug Use No    Social History   Socioeconomic History  . Marital status: Single    Spouse name: Not on file  . Number of children: Not on file  . Years of education: Not on file  . Highest education level: Not on file  Occupational History   . Not on file  Social Needs  . Financial resource strain: Not on file  . Food insecurity:    Worry: Not on file    Inability: Not on file  . Transportation needs:    Medical: Not on file    Non-medical: Not on file  Tobacco Use  . Smoking status: Current Every Day Smoker    Packs/day: 0.50    Types: Cigarettes  . Smokeless tobacco: Never Used  Substance and Sexual Activity  . Alcohol use: No  . Drug use: No  . Sexual activity: Not on file  Lifestyle  . Physical activity:    Days per week: Not on file    Minutes per session: Not on file  . Stress: Not on file  Relationships  . Social connections:    Talks on phone: Not on file    Gets together: Not on file    Attends religious service: Not on file    Active member of club or organization: Not on file    Attends meetings of clubs or organizations: Not on file    Relationship status: Not on file  Other Topics Concern  . Not on file  Social History Narrative  . Not on file   Additional Social History:  Sleep: Fair  Appetite:  Fair  Current Medications: Current Facility-Administered Medications  Medication Dose Route Frequency Provider Last Rate Last Dose  . acetaminophen (TYLENOL) tablet 650 mg  650 mg Oral Q6H PRN Catalina Gravel, NP   650 mg at 08/19/18 1622  . albuterol (VENTOLIN HFA) 108 (90 Base) MCG/ACT inhaler 1 puff  1 puff Inhalation Q4H PRN Thomspon, Adela Lank, NP      . fluvoxaMINE (LUVOX) tablet 100 mg  100 mg Oral QHS Thomspon, Adela Lank, NP   100 mg at 08/20/18 2156  . lisdexamfetamine (VYVANSE) capsule 70 mg  70 mg Oral Daily Catalina Gravel, NP   70 mg at 08/21/18 0757  . lurasidone (LATUDA) tablet 40 mg  40 mg Oral Q supper Clapacs, John T, MD   40 mg at 08/20/18 1657  . nicotine (NICODERM CQ - dosed in mg/24 hours) patch 21 mg  21 mg Transdermal Daily Clapacs, Jackquline Denmark, MD   21 mg at 08/21/18 0759  . Oxcarbazepine (TRILEPTAL) tablet 600 mg  600 mg Oral BID  Catalina Gravel, NP   600 mg at 08/21/18 9833    Lab Results: No results found for this or any previous visit (from the past 48 hour(s)).  Blood Alcohol level:  Lab Results  Component Value Date   ETH <10 08/17/2018   ETH <10 08/10/2017    Metabolic Disorder Labs: Lab Results  Component Value Date   HGBA1C 5.3 09/05/2016   MPG 105 09/05/2016   No results found for: PROLACTIN No results found for: CHOL, TRIG, HDL, CHOLHDL, VLDL, LDLCALC  Physical Findings: AIMS:  , ,  ,  ,    CIWA:    COWS:     Musculoskeletal: Strength & Muscle Tone: within normal limits Gait & Station: normal Patient leans: N/A  Psychiatric Specialty Exam: Physical Exam  Nursing note and vitals reviewed. Constitutional: He appears well-developed and well-nourished.  HENT:  Head: Normocephalic and atraumatic.  Eyes: Pupils are equal, round, and reactive to light. Conjunctivae are normal.  Neck: Normal range of motion.  Cardiovascular: Regular rhythm and normal heart sounds.  Respiratory: Effort normal.  GI: Soft.  Musculoskeletal: Normal range of motion.  Neurological: He is alert.  Skin: Skin is warm and dry.  Psychiatric: He has a normal mood and affect. His behavior is normal. Judgment and thought content normal.    Review of Systems  Constitutional: Negative.   HENT: Negative.   Eyes: Negative.   Respiratory: Negative.   Cardiovascular: Negative.   Gastrointestinal: Negative.   Musculoskeletal: Negative.   Skin: Negative.   Neurological: Negative.   Psychiatric/Behavioral: Negative.     Blood pressure 108/76, pulse 81, temperature 98.2 F (36.8 C), temperature source Oral, resp. rate 18, height 6\' 1"  (1.854 m), weight 79.4 kg, SpO2 98 %.Body mass index is 23.09 kg/m.  General Appearance: Casual  Eye Contact:  Fair  Speech:  Normal Rate  Volume:  Normal  Mood:  Euthymic  Affect:  Constricted  Thought Process:  Coherent  Orientation:  Full (Time, Place, and Person)  Thought  Content:  Logical  Suicidal Thoughts:  No  Homicidal Thoughts:  No  Memory:  Immediate;   Fair Recent;   Fair Remote;   Fair  Judgement:  Fair  Insight:  Fair  Psychomotor Activity:  Decreased  Concentration:  Concentration: Fair  Recall:  [employer] of Knowledge:  Fair  Language:  Fair  Akathisia:  No  Handed:  Right  AIMS (if indicated):  Assets:  Housing Physical Health  ADL's:  Intact  Cognition:  WNL  Sleep:  Number of Hours: 4.5     Treatment Plan Summary: Daily contact with patient to assess and evaluate symptoms and progress in treatment, Medication management and Plan Patient seems calm and very controlled during the day.  Seems to get a little more irritable at night.  I wonder whether it could be the Vyvanse wearing off at night but if that is the case there is probably not much we can do about it except help him to be aware of it and adapt to it.  Educational and cognitive therapy done.  No change to medicine for today.  Still plan for likely discharge by Monday  Joel Rasmussen, MD 08/21/2018, 12:05 PM

## 2018-08-21 NOTE — Progress Notes (Addendum)
D: Patient stated slept good last night .Stated appetite is good and energy level  Is normal. Stated concentration is good . Stated on Depression scale 5 , hopeless 0 and anxiety 2.( low 0-10 high) Denies suicidal  homicidal ideations  .  No auditory hallucinations  No pain concerns . Appropriate ADL'S. Interacting with peers and staff.  Emotional and mental status  improvement  noted . Staff continue to redirect information and given in concrete formate for better understanding Able to attend activities  on the unit . No anger or frustration noted by patient .  Voice no concerns around sleep. Working on coping  and decision making .Patient aware of information  with [place] , instructed on unit programing .  Goal today " Talk things out" Interacting  With peers and staff   A: Encourage patient participation with unit programming . Instruction  Given on  Medication , verbalize understanding.  R: Voice no other concerns. Staff continue to monitor

## 2018-08-21 NOTE — Plan of Care (Signed)
  Problem: Medication: Goal: Compliance with prescribed medication regimen will improve Outcome: Progressing  Patient complaint with prescribed medication.

## 2018-08-21 NOTE — Plan of Care (Signed)
Emotional and mental status  improvement  noted . Staff continue to redirect information and given in concrete formate for better understanding Able to attend activities  on the unit . No anger or frustration noted by patient .  Voice no concerns around sleep. Working on coping  and decision making .Patient aware of information  with [place] , instructed on unit programing .    Problem: Education: Goal: Knowledge of [facility] Education information/materials will improve Outcome: Progressing Goal: Emotional status will improve Outcome: Progressing Goal: Mental status will improve Outcome: Progressing Goal: Verbalization of understanding the information provided will improve Outcome: Progressing  Problem: Coping: Goal: Coping ability will improve Outcome: Progressing Goal: Will verbalize feelings Outcome: Progressing   Problem: Medication: Goal: Compliance with prescribed medication regimen will improve Outcome: Progressing

## 2018-08-22 MED ORDER — LURASIDONE HCL 40 MG PO TABS: 40.0000 mg | ORAL_TABLET | Freq: Every day | ORAL | 1 refills | Status: DC

## 2018-08-22 MED ORDER — ALBUTEROL SULFATE HFA 108 (90 BASE) MCG/ACT IN AERS: 1.0000 | INHALATION_SPRAY | RESPIRATORY_TRACT | 1 refills | Status: DC | PRN

## 2018-08-22 MED ORDER — FLUVOXAMINE MALEATE 100 MG PO TABS: 100.0000 mg | ORAL_TABLET | Freq: Every day | ORAL | 1 refills | Status: DC

## 2018-08-22 MED ORDER — OXCARBAZEPINE 600 MG PO TABS: 600.0000 mg | ORAL_TABLET | Freq: Two times a day (BID) | ORAL | 1 refills | Status: DC

## 2018-08-22 NOTE — Progress Notes (Signed)
While playing outside patient got his right ankle twisted.Ankle looks red and noted mild swelling.Patient C/O pain 6/10.Ice pack applied and told patient to keep elevate the feet.Tylenol given for pain.MD paged twice to notify.Patient stated that the pain is better now.

## 2018-08-22 NOTE — [facility] Group Notes (Signed)
LCSW Group Therapy Note 08/22/2018 1:15pm  Type of Therapy and Topic: Group Therapy: Feelings Around Returning Home & Establishing a Supportive Framework and Supporting Oneself When Supports Not Available  Participation Level: Active  Description of Group:  Patients first processed thoughts and feelings about upcoming discharge. These included fears of upcoming changes, lack of change, new living environments, judgements and expectations from others and overall stigma of mental health issues. The group then discussed the definition of a supportive framework, what that looks and feels like, and how do to discern it from an unhealthy non-supportive network. The group identified different types of supports as well as what to do when your family/friends are less than helpful or unavailable  Therapeutic Goals  1. Patient will identify one healthy supportive network that they can use at discharge. 2. Patient will identify one factor of a supportive framework and how to tell it from an unhealthy network. 3. Patient able to identify one coping skill to use when they do not have positive supports from others. 4. Patient will demonstrate ability to communicate their needs through discussion and/or role plays.  Summary of Patient Progress:  The patient reported he feels "happy." Pt engaged during group session. As patients processed their anxiety about discharge and described healthy supports patient shared Patients identified at least one self-care tool they were willing to use after discharge.   Therapeutic Modalities Cognitive Behavioral Therapy Motivational Interviewing   Dhanya Bogle  CUEBAS-COLON, LCSW 08/22/2018 10:31 AM

## 2018-08-22 NOTE — Plan of Care (Signed)
Patient is active and appropriate in the milieu.Rated his depression 4/10 and anxiety 5/10.Denies SI,HI and AVH.Patient states his goal for today is "letting people know how I feel."Compliant with medications.Attended groups.Appetite and energy level good.Support and encouragement given.

## 2018-08-22 NOTE — Plan of Care (Signed)
  Problem: Coping: Goal: Coping ability will improve Outcome: Progressing Goal: Will verbalize feelings Outcome: Progressing  D: Patient is pleasant and cooperative on the unit. Denies SI, HI and AV hallucinations. Affect is bright. Medication compliant. Denies any complaints. A: Continue to monitor for safety. R: Safety maintained.

## 2018-08-22 NOTE — Progress Notes (Signed)
[facility] MD Progress Note  08/22/2018 9:54 AM Joel Arroyo  MRN:  [id number] Subjective: Patient seen for follow-up.  He reports that his mood feels better.  He feels less depressed today.  More awake and alert.  He is active on the unit and interacting with others attending groups.  Seems to be making an effort to be outgoing in his behavior.  No sign however of mania.  Denies suicidal or homicidal ideation.  No evidence of current psychosis.  Tolerating medicine fine.  Remains agreeable to the idea of discharge after tomorrow Principal Problem: MDD (major depressive disorder), severe (HCC) Diagnosis: Principal Problem:   MDD (major depressive disorder), severe (HCC) Active Problems:   ADHD (attention deficit hyperactivity disorder)   Suicidal ideation   Self-inflicted laceration of wrist  Total Time spent with patient: 30 minutes  Past Psychiatric History: Chronic mood instability behavior problems various diagnoses.  History of self-inflicted injuries.  Past Medical History:  Past Medical History:  Diagnosis Date  . Anxiety   . Asthma   . Bipolar 1 disorder (HCC)   . Depression   . Seizures (HCC)     Past Surgical History:  Procedure Laterality Date  . HERNIA REPAIR     Family History:  Family History  Problem Relation Age of Onset  . Kidney cancer Neg Hx   . Kidney disease Neg Hx   . Prostate cancer Neg Hx    Family Psychiatric  History: None known Social History:  Social History   Substance and Sexual Activity  Alcohol Use No     Social History   Substance and Sexual Activity  Drug Use No    Social History   Socioeconomic History  . Marital status: Single    Spouse name: Not on file  . Number of children: Not on file  . Years of education: Not on file  . Highest education level: Not on file  Occupational History  . Not on file  Social Needs  . Financial resource strain: Not on file  . Food insecurity:    Worry: Not on file    Inability: Not on file   . Transportation needs:    Medical: Not on file    Non-medical: Not on file  Tobacco Use  . Smoking status: Current Every Day Smoker    Packs/day: 0.50    Types: Cigarettes  . Smokeless tobacco: Never Used  Substance and Sexual Activity  . Alcohol use: No  . Drug use: No  . Sexual activity: Not on file  Lifestyle  . Physical activity:    Days per week: Not on file    Minutes per session: Not on file  . Stress: Not on file  Relationships  . Social connections:    Talks on phone: Not on file    Gets together: Not on file    Attends religious service: Not on file    Active member of club or organization: Not on file    Attends meetings of clubs or organizations: Not on file    Relationship status: Not on file  Other Topics Concern  . Not on file  Social History Narrative  . Not on file   Additional Social History:                         Sleep: Fair  Appetite:  Fair  Current Medications: Current Facility-Administered Medications  Medication Dose Route Frequency Provider Last Rate Last Dose  . acetaminophen (  TYLENOL) tablet 650 mg  650 mg Oral Q6H PRN Catalina Gravel, NP   650 mg at 08/19/18 1622  . albuterol (VENTOLIN HFA) 108 (90 Base) MCG/ACT inhaler 1 puff  1 puff Inhalation Q4H PRN Catalina Gravel, NP      . fluvoxaMINE (LUVOX) tablet 100 mg  100 mg Oral QHS Thomspon, Adela Lank, NP   100 mg at 08/21/18 2133  . lisdexamfetamine (VYVANSE) capsule 70 mg  70 mg Oral Daily Catalina Gravel, NP   70 mg at 08/22/18 0753  . lurasidone (LATUDA) tablet 40 mg  40 mg Oral Q supper Clapacs, Jackquline Denmark, MD   40 mg at 08/21/18 1705  . nicotine (NICODERM CQ - dosed in mg/24 hours) patch 21 mg  21 mg Transdermal Daily Clapacs, Jackquline Denmark, MD   21 mg at 08/22/18 0753  . Oxcarbazepine (TRILEPTAL) tablet 600 mg  600 mg Oral BID Catalina Gravel, NP   600 mg at 08/22/18 1610    Lab Results: No results found for this or any previous visit (from the past 48  hour(s)).  Blood Alcohol level:  Lab Results  Component Value Date   ETH <10 08/17/2018   ETH <10 08/10/2017    Metabolic Disorder Labs: Lab Results  Component Value Date   HGBA1C 5.3 09/05/2016   MPG 105 09/05/2016   No results found for: PROLACTIN No results found for: CHOL, TRIG, HDL, CHOLHDL, VLDL, LDLCALC  Physical Findings: AIMS:  , ,  ,  ,    CIWA:    COWS:     Musculoskeletal: Strength & Muscle Tone: within normal limits Gait & Station: normal Patient leans: N/A  Psychiatric Specialty Exam: Physical Exam  Nursing note and vitals reviewed. Constitutional: He appears well-developed and well-nourished.  HENT:  Head: Normocephalic and atraumatic.  Eyes: Pupils are equal, round, and reactive to light. Conjunctivae are normal.  Neck: Normal range of motion.  Cardiovascular: Regular rhythm and normal heart sounds.  Respiratory: Effort normal. No respiratory distress.  GI: Soft.  Musculoskeletal: Normal range of motion.  Neurological: He is alert.  Skin: Skin is warm and dry.  Psychiatric: He has a normal mood and affect. His behavior is normal. Judgment and thought content normal.    Review of Systems  Constitutional: Negative.   HENT: Negative.   Eyes: Negative.   Respiratory: Negative.   Cardiovascular: Negative.   Gastrointestinal: Negative.   Musculoskeletal: Negative.   Skin: Negative.   Neurological: Negative.   Psychiatric/Behavioral: Negative.     Blood pressure 105/72, pulse 68, temperature 97.7 F (36.5 C), temperature source Oral, resp. rate 18, height  (1.854 m), weight 79.4 kg, SpO2 98 %.Body mass index is 23.09 kg/m.  General Appearance: Casual  Eye Contact:  Fair  Speech:  Clear and Coherent  Volume:  Normal  Mood:  Euthymic  Affect:  Congruent  Thought Process:  Goal Directed  Orientation:  Full (Time, Place, and Person)  Thought Content:  Logical  Suicidal Thoughts:  No  Homicidal Thoughts:  No  Memory:  Immediate;    Fair Recent;   Fair Remote;   Fair  Judgement:  Fair  Insight:  Fair  Psychomotor Activity:  Decreased  Concentration:  Concentration: Fair  Recall:  [employer] of Knowledge:  Fair  Language:  Fair  Akathisia:  No  Handed:  Right  AIMS (if indicated):     Assets:  Desire for Improvement Housing Physical Health Resilience Social Support  ADL's:  Intact  Cognition:  WNL  Sleep:  Number of Hours: 6.5     Treatment Plan Summary: Daily contact with patient to assess and evaluate symptoms and progress in treatment, Medication management and Plan He seems to do be doing well.  Has clearly gotten some benefit from hospitalization.  Tolerating medicines well.  No new physical complaints.  We spoke about his plans for discharge and we will go ahead and make preparations for likely discharge tomorrow [facility].  Mordecai RasmussenJohn Clapacs, MD 08/22/2018, 9:54 AM

## 2018-08-22 NOTE — Progress Notes (Signed)
D: Patient has been cooperative and pleasant. Affect is bright. Interacting appropriately with staff and peers. Denies SI, HI and AVH. Patient reportedly twisted his ankle while playing outside. Patients ankle has no edema. He complained of his little toe being sore. It is reddened but not edematous. Patient ambulating without difficulty. Refused pain medication. Continuing to apply ice. No other complaints. A: Continue to monitor for safety. R: Safety maintained

## 2018-08-22 NOTE — Progress Notes (Signed)
D: Patient is pleasant and cooperative on the unit. Denies SI, HI and AV hallucinations. Affect is bright. Medication compliant. Denies any complaints. A: Continue to monitor for safety. R: Safety maintained.

## 2018-08-22 NOTE — Plan of Care (Signed)
  Problem: Coping: Goal: Coping ability will improve Outcome: Adequate for Discharge Goal: Will verbalize feelings Outcome: Adequate for Discharge  D: Patient has been cooperative and pleasant. Affect is bright. Interacting appropriately with staff and peers. Denies SI, HI and AVH. A: Continue to monitor for safety. R: Safety maintained

## 2018-08-22 NOTE — [facility] Group Notes (Signed)
[facility] Group Notes:  (Nursing/MHT/Case Management/Adjunct)  Date:  08/22/2018  Time:  9:42 PM  Type of Therapy:  Group Therapy  Participation Level:  Did Not Attend  Summary of Progress/Problems:  Sherry Blackard L Demetrios Byron 08/22/2018, 9:42 PM 

## 2018-08-22 NOTE — [facility] Suicide Risk Assessment (Signed)
[facility] Discharge Suicide Risk Assessment   Principal Problem: MDD (major depressive disorder), severe (HCC) Discharge Diagnoses: Principal Problem:   MDD (major depressive disorder), severe (HCC) Active Problems:   ADHD (attention deficit hyperactivity disorder)   Suicidal ideation   Self-inflicted laceration of wrist   Total Time spent with patient: 45 minutes  Musculoskeletal: Strength & Muscle Tone: within normal limits Gait & Station: normal Patient leans: N/A  Psychiatric Specialty Exam: Review of Systems  Constitutional: Negative.   HENT: Negative.   Eyes: Negative.   Respiratory: Negative.   Cardiovascular: Negative.   Gastrointestinal: Negative.   Musculoskeletal: Negative.   Skin: Negative.   Neurological: Negative.   Psychiatric/Behavioral: Negative.     Blood pressure 105/72, pulse 68, temperature 97.7 F (36.5 C), temperature source Oral, resp. rate 18, height 6\' 1"  (1.854 m), weight 79.4 kg, SpO2 98 %.Body mass index is 23.09 kg/m.  General Appearance: Casual  Eye Contact::  Good  Speech:  Clear and Coherent409  Volume:  Normal  Mood:  Euthymic  Affect:  Congruent  Thought Process:  Goal Directed  Orientation:  Full (Time, Place, and Person)  Thought Content:  Logical  Suicidal Thoughts:  No  Homicidal Thoughts:  No  Memory:  Immediate;   Fair Recent;   Fair Remote;   Fair  Judgement:  Fair  Insight:  Fair  Psychomotor Activity:  Normal  Concentration:  Fair  Recall:  [employer] of Knowledge:Fair  Language: Fair  Akathisia:  No  Handed:  Right  AIMS (if indicated):     Assets:  Desire for Improvement Housing Physical Health Resilience Social Support  Sleep:  Number of Hours: 6.5  Cognition: WNL  ADL's:  Intact   Mental Status Per Nursing Assessment::   On Admission:  Self-harm thoughts, Self-harm behaviors  Demographic Factors:  Male, Adolescent or young adult, Caucasian and Unemployed  Loss Factors: A lot of loneliness and losses  related to the current coronavirus quarantine situation  Historical Factors: Prior suicide attempts and Impulsivity  Risk Reduction Factors:   Sense of responsibility to family, Religious beliefs about death, Positive social support, Positive therapeutic relationship and Positive coping skills or problem solving skills  Continued Clinical Symptoms:  Schizophrenia:   Less than 67 years old  Cognitive Features That Contribute To Risk:  None    Suicide Risk:  Minimal: No identifiable suicidal ideation.  Patients presenting with no risk factors but with morbid ruminations; may be classified as minimal risk based on the severity of the depressive symptoms  Follow-up Information    [facility] Follow up on 08/27/2018.   Why:  You have an appointment scheduled for Friday 08/27/18 at 3:00PM for an assessment. It will be over the telephone. Please mention interest in their day program called [facility] during your assessment. Thank you! Contact information: [address] [place] [address] [phone]        [facility] Follow up.   Why:  Please continue peer support services with Britta Mccreedy and therapy with Elnita Maxwell as normal upon discharge. Thank you! Contact information: [address] [place] [address] [phone]           Plan Of Care/Follow-up recommendations:  Activity:  Activity as tolerated Diet:  Regular diet Other:  He has follow-up in [place].  Continue current medicines.  Prescriptions provided at discharge.  Patient is agreeable to making plans to manage his mood going forward to minimize impulsive self injury  Mordecai Rasmussen, MD 08/22/2018,  9:57 AM

## 2018-08-23 NOTE — Progress Notes (Signed)
  [facility] Adult Case Management Discharge Plan :  Will you be returning to the same living situation after discharge:  Yes,  home At discharge, do you have transportation home?: Yes,  step-mother will pick pt up  Do you have the ability to pay for your medications: Yes,  medicaid insurance  Release of information consent forms completed and in the chart;    Patient to Follow up at: Follow-up Information    [facility] Follow up on 08/27/2018.   Why:  You have an appointment scheduled for Friday 08/27/18 at 3:00PM for an assessment. It will be over the telephone. Please mention interest in their day program called [facility] during your assessment. Thank you! Contact information: [address] [place] [address] [phone]        [facility] Follow up.   Why:  Please continue peer support services with Britta Mccreedy and therapy with Elnita Maxwell as normal upon discharge. Thank you! Contact information: [address] [place] [address] [phone]           Next level of care provider has access to [facility] Link:no  Safety Planning and Suicide Prevention discussed: Yes,  SPE completed with pts step-mother/legal guardian  Have you used any form of tobacco in the last 30 days? (Cigarettes, Smokeless Tobacco, Cigars, and/or Pipes): Yes  Has patient been referred to the Quitline?: Patient refused referral  Patient has been referred for addiction treatment: N/A  Mechele Dawley, LCSW 08/23/2018, 9:41 AM

## 2018-08-23 NOTE — Progress Notes (Signed)
Patient alert and oriented x 4. Ambulates unit with steady gait. Verbally denies SI/HI/AVH and pain. Patient discharged on above date and time. Verbalized understanding the discharge information provided to patient upon discharge. Patient departed unit with discharge paperwork, prescriptions and personal belongings. Picked up by his mother, no distress noted.

## 2018-08-23 NOTE — Tx Team (Signed)
Interdisciplinary Treatment and Diagnostic Plan Update  08/23/2018 Time of Session: 830AM Joel Arroyo MRN: [id number]  Principal Diagnosis: MDD (major depressive disorder), severe (HCC)  Secondary Diagnoses: Principal Problem:   MDD (major depressive disorder), severe (HCC) Active Problems:   ADHD (attention deficit hyperactivity disorder)   Suicidal ideation   Self-inflicted laceration of wrist   Current Medications:  Current Facility-Administered Medications  Medication Dose Route Frequency Provider Last Rate Last Dose  . acetaminophen (TYLENOL) tablet 650 mg  650 mg Oral Q6H PRN Catalina Gravel, NP   650 mg at 08/23/18 0801  . albuterol (VENTOLIN HFA) 108 (90 Base) MCG/ACT inhaler 1 puff  1 puff Inhalation Q4H PRN Catalina Gravel, NP      . fluvoxaMINE (LUVOX) tablet 100 mg  100 mg Oral QHS [place], Adela Lank, NP   100 mg at 08/22/18 2136  . lisdexamfetamine (VYVANSE) capsule 70 mg  70 mg Oral Daily Catalina Gravel, NP   70 mg at 08/23/18 0801  . lurasidone (LATUDA) tablet 40 mg  40 mg Oral Q supper Clapacs, Jackquline Denmark, MD   40 mg at 08/22/18 1652  . nicotine (NICODERM CQ - dosed in mg/24 hours) patch 21 mg  21 mg Transdermal Daily Clapacs, Jackquline Denmark, MD   21 mg at 08/23/18 0750  . Oxcarbazepine (TRILEPTAL) tablet 600 mg  600 mg Oral BID Catalina Gravel, NP   600 mg at 08/23/18 0801   PTA Medications: Medications Prior to Admission  Medication Sig Dispense Refill Last Dose  . Albuterol Sulfate 108 (90 Base) MCG/ACT AEPB Inhale 1 puff into the lungs every 4 (four) hours as needed.   prn at prn  . LATUDA 40 MG TABS tablet Take 40 mg by mouth daily with breakfast.    unknown at unknown  . VYVANSE 70 MG capsule Take 70 mg by mouth daily.    unknown at unknown  . [DISCONTINUED] fluvoxaMINE (LUVOX) 100 MG tablet Take 1 tablet (100 mg total) by mouth at bedtime. 30 tablet 0 unknown at unknown  . [DISCONTINUED] oxcarbazepine (TRILEPTAL) 600 MG tablet Take 600 mg by  mouth 2 (two) times daily.    unknown at unknown    Patient Stressors: Educational concerns Legal issue Medication change or noncompliance Traumatic event  Patient Strengths: Ability for insight Physical Health Supportive family/friends  Treatment Modalities: Medication Management, Group therapy, Case management,  1 to 1 session with clinician, Psychoeducation, Recreational therapy.   Physician Treatment Plan for Primary Diagnosis: MDD (major depressive disorder), severe (HCC) Long Term Goal(s): Improvement in symptoms so as ready for discharge Improvement in symptoms so as ready for discharge   Short Term Goals: Ability to verbalize feelings will improve Ability to disclose and discuss suicidal ideas Ability to demonstrate self-control will improve Compliance with prescribed medications will improve Ability to identify triggers associated with substance abuse/mental health issues will improve  Medication Management: Evaluate patient's response, side effects, and tolerance of medication regimen.  Therapeutic Interventions: 1 to 1 sessions, Unit Group sessions and Medication administration.  Evaluation of Outcomes: Adequate for Discharge  Physician Treatment Plan for Secondary Diagnosis: Principal Problem:   MDD (major depressive disorder), severe (HCC) Active Problems:   ADHD (attention deficit hyperactivity disorder)   Suicidal ideation   Self-inflicted laceration of wrist  Long Term Goal(s): Improvement in symptoms so as ready for discharge Improvement in symptoms so as ready for discharge   Short Term Goals: Ability to verbalize feelings will improve Ability to disclose and discuss suicidal ideas Ability to demonstrate  self-control will improve Compliance with prescribed medications will improve Ability to identify triggers associated with substance abuse/mental health issues will improve     Medication Management: Evaluate patient's response, side effects, and  tolerance of medication regimen.  Therapeutic Interventions: 1 to 1 sessions, Unit Group sessions and Medication administration.  Evaluation of Outcomes: Adequate for Discharge   RN Treatment Plan for Primary Diagnosis: MDD (major depressive disorder), severe (HCC) Long Term Goal(s): Knowledge of disease and therapeutic regimen to maintain health will improve  Short Term Goals: Ability to remain free from injury will improve, Ability to verbalize frustration and anger appropriately will improve, Ability to demonstrate self-control, Ability to participate in decision making will improve and Ability to identify and develop effective coping behaviors will improve  Medication Management: RN will administer medications as ordered by provider, will assess and evaluate patient's response and provide education to patient for prescribed medication. RN will report any adverse and/or side effects to prescribing provider.  Therapeutic Interventions: 1 on 1 counseling sessions, Psychoeducation, Medication administration, Evaluate responses to treatment, Monitor vital signs and CBGs as ordered, Perform/monitor CIWA, COWS, AIMS and Fall Risk screenings as ordered, Perform wound care treatments as ordered.  Evaluation of Outcomes: Adequate for Discharge   LCSW Treatment Plan for Primary Diagnosis: MDD (major depressive disorder), severe (HCC) Long Term Goal(s): Safe transition to appropriate next level of care at discharge, Engage patient in therapeutic group addressing interpersonal concerns.  Short Term Goals: Engage patient in aftercare planning with referrals and resources, Increase social support, Increase emotional regulation and Increase skills for wellness and recovery  Therapeutic Interventions: Assess for all discharge needs, 1 to 1 time with Social worker, Explore available resources and support systems, Assess for adequacy in community support network, Educate family and significant other(s) on  suicide prevention, Complete Psychosocial Assessment, Interpersonal group therapy.  Evaluation of Outcomes: Adequate for Discharge   Progress in Treatment: Attending groups: Yes. Participating in groups: Yes. Taking medication as prescribed: Yes. Toleration medication: Yes. Family/Significant other contact made: Yes, individual(s) contacted:  Pts step-mother and legal guardian Patient understands diagnosis: Yes. Discussing patient identified problems/goals with staff: Yes. Medical problems stabilized or resolved: Yes. Denies suicidal/homicidal ideation: Yes. Issues/concerns per patient self-inventory: No. Other: N/A  New problem(s) identified: No, Describe:  none  New Short Term/Long Term Goal(s):  medication management for mood stabilization; elimination of SI thoughts; development of comprehensive mental wellness/sobriety plan.   Patient Goals:  "Get the help I need to go home to my family"  Discharge Plan or Barriers: SPE pamphlet, Mobile Crisis information, and AA/NA information provided to patient for additional community support and resources. Pt will continue to follow up with [facility] for peer support and therapy, and has an appointment with [facility] in [place] for psychiatry on 08/27/2018 at 3:00PM.  Reason for Continuation of Hospitalization: none  Estimated Length of Stay: Today 08/23/2018  Recreational Therapy: Patient Stressors: Family  Patient Goal: Patient will identify 3 positive coping skills strategies to use post d/c within 5 recreation therapy group sessions  Attendees: Patient: Joel Arroyo 08/23/2018 11:40 AM  Physician:  08/23/2018 11:40 AM  Nursing:  08/23/2018 11:40 AM  RN Care Manager: 08/23/2018 11:40 AM  Social Worker:  Zollie Scalelivia Desirea Mizrahi LCSW 08/23/2018 11:40 AM  Recreational Therapist:  08/23/2018 11:40 AM  Other:  08/23/2018 11:40 AM  Other:  08/23/2018 11:40 AM  Other: 08/23/2018 11:40 AM    Scribe for Treatment Team: Charlann Langelivia K Jalien Weakland,  LCSW 08/23/2018 11:40 AM

## 2018-08-23 NOTE — Discharge Summary (Signed)
Physician Discharge Summary Note  Patient:  Joel Arroyo is an 23 y.o., male MRN:  [id number] DOB:  1995/04/01 Patient phone:  [phone] (home)  Patient address:   [address] [place] [address],  Total Time spent with patient: 45 minutes  Date of Admission:  08/17/2018 Date of Discharge: August 23, 2018  Reason for Admission: Admitted to the hospital because of reports of suicidal thinking depression and irritability some self injury and cutting.  Principal Problem: MDD (major depressive disorder), severe ([place]) Discharge Diagnoses: Principal Problem:   MDD (major depressive disorder), severe ([place]) Active Problems:   ADHD (attention deficit hyperactivity disorder)   Suicidal ideation   Self-inflicted laceration of wrist   Past Psychiatric History: Patient has a history of depression self injury ADHD chronic behavior problems  Past Medical History:  Past Medical History:  Diagnosis Date  . Anxiety   . Asthma   . Bipolar 1 disorder ([place])   . Depression   . Seizures ([place])     Past Surgical History:  Procedure Laterality Date  . HERNIA REPAIR     Family History:  Family History  Problem Relation Age of Onset  . Kidney cancer Neg Hx   . Kidney disease Neg Hx   . Prostate cancer Neg Hx    Family Psychiatric  History: None known Social History:  Social History   Substance and Sexual Activity  Alcohol Use No     Social History   Substance and Sexual Activity  Drug Use No    Social History   Socioeconomic History  . Marital status: Single    Spouse name: Not on file  . Number of children: Not on file  . Years of education: Not on file  . Highest education level: Not on file  Occupational History  . Not on file  Social Needs  . Financial resource strain: Not on file  . Food insecurity:    Worry: Not on file    Inability: Not on file  . Transportation needs:    Medical: Not on file    Non-medical: Not on file  Tobacco Use  . Smoking status: Current  Every Day Smoker    Packs/day: 0.50    Types: Cigarettes  . Smokeless tobacco: Never Used  Substance and Sexual Activity  . Alcohol use: No  . Drug use: No  . Sexual activity: Not on file  Lifestyle  . Physical activity:    Days per week: Not on file    Minutes per session: Not on file  . Stress: Not on file  Relationships  . Social connections:    Talks on phone: Not on file    Gets together: Not on file    Attends religious service: Not on file    Active member of club or organization: Not on file    Attends meetings of clubs or organizations: Not on file    Relationship status: Not on file  Other Topics Concern  . Not on file  Social History Narrative  . Not on file    Hospital Course: Patient admitted to the psychiatric ward.  15-minute checks employed.  Did not display dangerous or violent behavior on the unit.  He was cooperative and appropriate with treatment including groups and individual counseling.  Showed improved insight.  Still at times showed some childish behavior but had insight into it and was gradually getting better.  Medicines were largely unchanged.  He continued the Vyvanse in the hospital.  Alric Seton  in the evening.  Patient by the time of discharge was denying any suicidal thoughts.  He indicated that his mood felt significantly better.  He was agreeable to continuing outpatient treatment in the community.  Psychoeducation completed about the importance of avoiding alcohol and drugs as well.  Physical Findings: AIMS:  , ,  ,  ,    CIWA:    COWS:     Musculoskeletal: Strength & Muscle Tone: within normal limits Gait & Station: normal Patient leans: N/A  Psychiatric Specialty Exam: Physical Exam  Nursing note and vitals reviewed. Constitutional: He appears well-developed and well-nourished.  HENT:  Head: Normocephalic and atraumatic.  Eyes: Pupils are equal, round, and reactive to light. Conjunctivae are normal.  Neck: Normal range of motion.   Cardiovascular: Regular rhythm and normal heart sounds.  Respiratory: Effort normal. No respiratory distress.  GI: Soft.  Musculoskeletal: Normal range of motion.  Neurological: He is alert.  Skin: Skin is warm and dry.  Psychiatric: He has a normal mood and affect. His behavior is normal. Judgment and thought content normal.    Review of Systems  Constitutional: Negative.   HENT: Negative.   Eyes: Negative.   Respiratory: Negative.   Cardiovascular: Negative.   Gastrointestinal: Negative.   Musculoskeletal: Negative.   Skin: Negative.   Neurological: Negative.   Psychiatric/Behavioral: Negative.     Blood pressure 122/76, pulse 64, temperature 97.7 F (36.5 C), temperature source Oral, resp. rate 18, height 6' 1"  (1.854 m), weight 79.4 kg, SpO2 97 %.Body mass index is 23.09 kg/m.  General Appearance: Casual  Eye Contact:  Good  Speech:  Clear and Coherent  Volume:  Decreased  Mood:  Euthymic  Affect:  Congruent  Thought Process:  Goal Directed  Orientation:  Full (Time, Place, and Person)  Thought Content:  Logical  Suicidal Thoughts:  No  Homicidal Thoughts:  No  Memory:  Immediate;   Fair Recent;   Fair Remote;   Fair  Judgement:  Fair  Insight:  Fair  Psychomotor Activity:  Decreased  Concentration:  Concentration: Fair  Recall:  [employer] of Knowledge:  Fair  Language:  Fair  Akathisia:  No  Handed:  Right  AIMS (if indicated):     Assets:  Desire for Improvement Housing Physical Health Resilience Social Support  ADL's:  Intact  Cognition:  WNL  Sleep:  Number of Hours: 6     Have you used any form of tobacco in the last 30 days? (Cigarettes, Smokeless Tobacco, Cigars, and/or Pipes): Yes  Has this patient used any form of tobacco in the last 30 days? (Cigarettes, Smokeless Tobacco, Cigars, and/or Pipes) Yes, Yes, A prescription for an FDA-approved tobacco cessation medication was offered at discharge and the patient refused  Blood Alcohol level:   Lab Results  Component Value Date   ETH <10 08/17/2018   ETH <10 03/50/0938    Metabolic Disorder Labs:  Lab Results  Component Value Date   HGBA1C 5.3 09/05/2016   MPG 105 09/05/2016   No results found for: PROLACTIN No results found for: CHOL, TRIG, HDL, CHOLHDL, VLDL, LDLCALC  See Psychiatric Specialty Exam and Suicide Risk Assessment completed by Attending Physician prior to discharge.  Discharge destination:  Home  Is patient on multiple antipsychotic therapies at discharge:  No   Has Patient had three or more failed trials of antipsychotic monotherapy by history:  No  Recommended Plan for Multiple Antipsychotic Therapies: NA  Discharge Instructions    Diet - low  sodium heart healthy   Complete by:  As directed    Increase activity slowly   Complete by:  As directed      Allergies as of 08/23/2018   No Known Allergies     Medication List    STOP taking these medications   Albuterol Sulfate 108 (90 Base) MCG/ACT Aepb Replaced by:  albuterol 108 (90 Base) MCG/ACT inhaler     TAKE these medications     Indication  albuterol 108 (90 Base) MCG/ACT inhaler Commonly known as:  VENTOLIN HFA Inhale 1 puff into the lungs every 4 (four) hours as needed (SOB). Replaces:  Albuterol Sulfate 108 (90 Base) MCG/ACT Aepb  Indication:  Asthma   fluvoxaMINE 100 MG tablet Commonly known as:  LUVOX Take 1 tablet (100 mg total) by mouth at bedtime.  Indication:  Obsessive Compulsive Disorder   lurasidone 40 MG Tabs tablet Commonly known as:  Latuda Take 1 tablet (40 mg total) by mouth daily with supper. What changed:  when to take this  Indication:  Depressive Phase of Manic-Depression   oxcarbazepine 600 MG tablet Commonly known as:  TRILEPTAL Take 1 tablet (600 mg total) by mouth 2 (two) times daily.  Indication:  Bipolar disorder   Vyvanse 70 MG capsule Generic drug:  lisdexamfetamine Take 70 mg by mouth daily.  Indication:  Attention Deficit Hyperactivity  Disorder      Follow-up Information    [facility] Follow up on 08/27/2018.   Why:  You have an appointment scheduled for Friday 08/27/18 at 3:00PM for an assessment. It will be over the telephone. Please mention interest in their day program called [facility] during your assessment. Thank you! Contact information: [address] [place] [address] [facility] Follow up.   Why:  Please continue peer support services with Pamala Hurry and therapy with Malachy Mood as normal upon discharge. Thank you! Contact information: [place] [address] [phone]           Follow-up recommendations:  Activity:  Activity as tolerated Diet:  Regular diet Other:  [facility] [place]  Comments: Patient no longer met commitment criteria.  He was tolerating medicine well without any side effects and showed improved insight and was much calmer at discharge.  Signed: Alethia Berthold, MD 08/23/2018, 5:32 PM

## 2018-09-02 ENCOUNTER — Other Ambulatory Visit: Payer: Self-pay

## 2018-09-02 ENCOUNTER — Emergency Department
Admission: EM | Admit: 2018-09-02 | Discharge: 2018-09-03 | Disposition: A | Discharge status: Other Acute Inpatient Hospital | Payer: Medicaid Other | Attending: Emergency Medicine | Admitting: Emergency Medicine

## 2018-09-02 ENCOUNTER — Encounter: Payer: Self-pay | Admitting: Emergency Medicine

## 2018-09-02 DIAGNOSIS — F319 Bipolar disorder, unspecified: Secondary | ICD-10-CM | POA: Insufficient documentation

## 2018-09-02 DIAGNOSIS — F17210 Nicotine dependence, cigarettes, uncomplicated: Secondary | ICD-10-CM | POA: Insufficient documentation

## 2018-09-02 DIAGNOSIS — Y929 Unspecified place or not applicable: Secondary | ICD-10-CM | POA: Insufficient documentation

## 2018-09-02 DIAGNOSIS — F329 Major depressive disorder, single episode, unspecified: Secondary | ICD-10-CM | POA: Diagnosis present

## 2018-09-02 DIAGNOSIS — J45909 Unspecified asthma, uncomplicated: Secondary | ICD-10-CM | POA: Insufficient documentation

## 2018-09-02 DIAGNOSIS — Z79899 Other long term (current) drug therapy: Secondary | ICD-10-CM | POA: Diagnosis not present

## 2018-09-02 DIAGNOSIS — W260XXA Contact with knife, initial encounter: Secondary | ICD-10-CM | POA: Insufficient documentation

## 2018-09-02 DIAGNOSIS — Z20828 Contact with and (suspected) exposure to other viral communicable diseases: Secondary | ICD-10-CM | POA: Diagnosis not present

## 2018-09-02 DIAGNOSIS — S61412A Laceration without foreign body of left hand, initial encounter: Secondary | ICD-10-CM | POA: Insufficient documentation

## 2018-09-02 DIAGNOSIS — Y999 Unspecified external cause status: Secondary | ICD-10-CM | POA: Insufficient documentation

## 2018-09-02 DIAGNOSIS — Y939 Activity, unspecified: Secondary | ICD-10-CM | POA: Diagnosis not present

## 2018-09-02 DIAGNOSIS — Y93G1 Activity, food preparation and clean up: Secondary | ICD-10-CM | POA: Diagnosis not present

## 2018-09-02 DIAGNOSIS — F32A Depression, unspecified: Secondary | ICD-10-CM

## 2018-09-02 LAB — CBC
HCT: 49.2 % (ref 39.0–52.0)
Hemoglobin: 16.9 g/dL (ref 13.0–17.0)
MCH: 31.9 pg (ref 26.0–34.0)
MCHC: 34.3 g/dL (ref 30.0–36.0)
MCV: 93 fL (ref 80.0–100.0)
Platelets: 288 10*3/uL (ref 150–400)
RBC: 5.29 MIL/uL (ref 4.22–5.81)
RDW: 12.1 % (ref 11.5–15.5)
WBC: 8.8 10*3/uL (ref 4.0–10.5)
nRBC: 0 % (ref 0.0–0.2)

## 2018-09-02 LAB — COMPREHENSIVE METABOLIC PANEL
ALT: 40 U/L (ref 0–44)
AST: 32 U/L (ref 15–41)
Albumin: 5.1 g/dL — ABNORMAL HIGH (ref 3.5–5.0)
Alkaline Phosphatase: 78 U/L (ref 38–126)
Anion gap: 9 (ref 5–15)
BUN: 19 mg/dL (ref 6–20)
CO2: 29 mmol/L (ref 22–32)
Calcium: 9.6 mg/dL (ref 8.9–10.3)
Chloride: 103 mmol/L (ref 98–111)
Creatinine, Ser: 1.1 mg/dL (ref 0.61–1.24)
GFR calc Af Amer: >60 mL/min (ref >60)
GFR calc non Af Amer: >60 mL/min (ref >60)
Glucose, Bld: 92 mg/dL (ref 70–99)
Potassium: 3.7 mmol/L (ref 3.5–5.1)
Sodium: 141 mmol/L (ref 135–145)
Total Bilirubin: 0.5 mg/dL (ref 0.3–1.2)
Total Protein: 8.4 g/dL — ABNORMAL HIGH (ref 6.5–8.1)

## 2018-09-02 LAB — URINE DRUG SCREEN, QUALITATIVE (ARMC ONLY)
Amphetamines, Ur Screen: POSITIVE — AB
Barbiturates, Ur Screen: NOT DETECTED
Benzodiazepine, Ur Scrn: NOT DETECTED
Cannabinoid 50 Ng, Ur ~~LOC~~: NOT DETECTED
Cocaine Metabolite,Ur ~~LOC~~: NOT DETECTED
MDMA (Ecstasy)Ur Screen: NOT DETECTED
Methadone Scn, Ur: NOT DETECTED
Opiate, Ur Screen: NOT DETECTED
Phencyclidine (PCP) Ur S: NOT DETECTED
Tricyclic, Ur Screen: NOT DETECTED

## 2018-09-02 LAB — ETHANOL: Alcohol, Ethyl (B): <10 mg/dL (ref <10)

## 2018-09-02 LAB — SALICYLATE LEVEL: Salicylate Lvl: <7 mg/dL (ref 2.8–30.0)

## 2018-09-02 LAB — ACETAMINOPHEN LEVEL: Acetaminophen (Tylenol), Serum: <10 ug/mL — ABNORMAL LOW (ref 10–30)

## 2018-09-02 MED ORDER — TEMAZEPAM 7.5 MG PO CAPS
7.5000 mg | ORAL_CAPSULE | Freq: Every evening | ORAL | Status: DC | PRN
  Filled 2018-09-02: qty 1

## 2018-09-02 MED ORDER — LURASIDONE HCL 40 MG PO TABS: 40.0000 mg | ORAL_TABLET | Freq: Every day | ORAL | Status: DC

## 2018-09-02 MED ORDER — LIDOCAINE HCL (PF) 1 % IJ SOLN
5.0000 mL | Freq: Once | INTRAMUSCULAR | Status: AC
  Administered 2018-09-02: 5 mL
  Filled 2018-09-02: qty 5

## 2018-09-02 MED ORDER — CEPHALEXIN 500 MG PO CAPS
500.0000 mg | ORAL_CAPSULE | Freq: Four times a day (QID) | ORAL | Status: DC
  Administered 2018-09-02 – 2018-09-03 (×5): 500 mg via ORAL
  Filled 2018-09-02 (×5): qty 1

## 2018-09-02 MED ORDER — FLUVOXAMINE MALEATE 50 MG PO TABS
100.0000 mg | ORAL_TABLET | Freq: Every day | ORAL | Status: DC
  Administered 2018-09-02: 100 mg via ORAL
  Filled 2018-09-02 (×2): qty 2

## 2018-09-02 MED ORDER — ALBUTEROL SULFATE (2.5 MG/3ML) 0.083% IN NEBU: 2.5000 mg | INHALATION_SOLUTION | RESPIRATORY_TRACT | Status: DC | PRN

## 2018-09-02 MED ORDER — CEPHALEXIN 500 MG PO CAPS: 500.0000 mg | ORAL_CAPSULE | Freq: Four times a day (QID) | ORAL | 0 refills | Status: DC

## 2018-09-02 MED ORDER — LURASIDONE HCL 40 MG PO TABS
40.0000 mg | ORAL_TABLET | Freq: Every day | ORAL | Status: DC
  Administered 2018-09-02 – 2018-09-03 (×2): 40 mg via ORAL
  Filled 2018-09-02 (×3): qty 1

## 2018-09-02 MED ORDER — OXCARBAZEPINE 300 MG PO TABS
600.0000 mg | ORAL_TABLET | Freq: Two times a day (BID) | ORAL | Status: DC
  Administered 2018-09-02 – 2018-09-03 (×2): 600 mg via ORAL
  Filled 2018-09-02 (×2): qty 2

## 2018-09-02 NOTE — ED Triage Notes (Signed)
Having SI with plan of hanging self.  Spoke with mother legal guardian and she is one who brought pt.  Pt denies hallucinations.  Reports got a knife planning to hurt self but did not use it.

## 2018-09-02 NOTE — BH Assessment (Signed)
Assessment Note  Joel Arroyo is an 23 y.o. male. Who presents after having SI with plan of hanging self.  Pt presents with mother who is legal guardian.  Patient well known to the service with multiple admissions, most presentation occurring on 09/02/18. Please refer to most recent assessment for complete psychosocial history. Pt presents today as oriented X4 and with primary complaints of chronic SI. Pt states that he believed that he was release released to soon. The patient initial assessment done which he verbalize suicide ideation by hanging. 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 Pt presents as depressed and hopeless. A thorough psychiatric evaluation has been completed including the evaluation of the patient, reviewing available medical/clinic records, evaluating the pts unique risks and protective factors, and discussing treatment recommendations.    The patient does meet admission criteria at this time. This was explained to the pt, who voiced understanding.    Diagnosis: Bipolar Disorder   Past Medical History:  Past Medical History:  Diagnosis Date  . Anxiety   . Asthma   . Bipolar 1 disorder ([place])   . Depression   . Seizures ([place])     Past Surgical History:  Procedure Laterality Date  . HERNIA REPAIR      Family History:  Family History  Problem Relation Age of Onset  . Kidney cancer Neg Hx   . Kidney disease Neg Hx   . Prostate cancer Neg Hx     Social History:  reports that he has been smoking cigarettes. He has been smoking about 0.50 packs per day. He has never used smokeless tobacco. He reports that he does not drink alcohol or use drugs.  Additional Social History:  Alcohol / Drug Use Pain Medications: SEE PTA Prescriptions: SEE PTA Over the Counter: SEE PTA History of alcohol / drug use?: No history of alcohol / drug abuse  CIWA: CIWA-Ar BP: (!) 127/92 Pulse Rate: 89 COWS:    Allergies: No Known Allergies  Home  Medications: (Not in a hospital admission)   OB/GYN Status:  No LMP for male patient.  General Assessment Data TTS Assessment: In system Is this a Tele or Face-to-Face Assessment?: Tele Assessment Is this an Initial Assessment or a Re-assessment for this encounter?: Initial Assessment Patient Accompanied by:: N/A Language Other than English: No Living Arrangements: Other (Comment) What gender do you identify as?: Male Marital status: Single Living Arrangements: Parent Can pt return to current living arrangement?: Yes Admission Status: Voluntary Is patient capable of signing voluntary admission?: Yes Referral Source: Self/Family/Friend Insurance type: medicaid   Medical Screening Exam ([place]) Medical Exam completed: Yes  Crisis Care Plan Living Arrangements: Parent Legal Guardian: Mother Name of Psychiatrist: [facility] Name of Therapist: [facility]  Education Status Is patient currently in school?: No Is the patient employed, unemployed or receiving disability?: Receiving disability income  Risk to self with the past 6 months Suicidal Ideation: Yes-Currently Present Has patient been a risk to self within the past 6 months prior to admission? : Yes Suicidal Intent: Yes-Currently Present Has patient had any suicidal intent within the past 6 months prior to admission? : Yes Is patient at risk for suicide?: Yes Suicidal Plan?: Yes-Currently Present Has patient had any suicidal plan within the past 6 months prior to admission? : Yes Specify Current Suicidal Plan: Cut self Access to Means: Yes Specify Access to Suicidal Means: Yes  What has been your use of drugs/alcohol within the last 12 months?: No  Previous Attempts/Gestures: Yes How many times?: 1 Other Self Harm Risks: self harms Intentional Self Injurious Behavior: Cutting Comment - Self Injurious Behavior: cutter  Family Suicide History: No Recent stressful life event(s): Conflict  (Comment) Persecutory voices/beliefs?: No Depression: Yes Depression Symptoms: Fatigue, Tearfulness, Loss of interest in usual pleasures Substance abuse history and/or treatment for substance abuse?: No Suicide prevention information given to non-admitted patients: Not applicable  Risk to Others within the past 6 months Homicidal Ideation: No Does patient have any lifetime risk of violence toward others beyond the six months prior to admission? : No Thoughts of Harm to Others: No Current Homicidal Intent: No Current Homicidal Plan: No Identified Victim: no History of harm to others?: No Assessment of Violence: None Noted Violent Behavior Description: none Does patient have access to weapons?: No Criminal Charges Pending?: No Does patient have a court date: No Is patient on probation?: No  Psychosis Hallucinations: None noted Delusions: None noted  Mental Status Report Appearance/Hygiene: In scrubs Eye Contact: Fair Motor Activity: Freedom of movement Speech: Logical/coherent Level of Consciousness: Alert Mood: Anxious Affect: Appropriate to circumstance Anxiety Level: Minimal Thought Processes: Coherent Judgement: Partial Orientation: Person, Place, Time, Situation Obsessive Compulsive Thoughts/Behaviors: None  Cognitive Functioning Concentration: Normal Memory: Recent Intact, Remote Intact Is patient IDD: No Insight: Fair Impulse Control: Fair Appetite: Fair Have you had any weight changes? : No Change Sleep: No Change Total Hours of Sleep: 6 Vegetative Symptoms: None  ADLScreening [facility] Assessment Services) Patient's cognitive ability adequate to safely complete daily activities?: Yes Patient able to express need for assistance with ADLs?: Yes Independently performs ADLs?: Yes (appropriate for developmental age)  Prior Inpatient Therapy Prior Inpatient Therapy: Yes Prior Therapy Dates: Recent  [facility]  Reason for Treatment:  Bipolar Disorder   Prior Outpatient Therapy Prior Outpatient Therapy: Yes Prior Therapy Dates: Current Prior Therapy Facilty/Provider(s): [facility] Reason for Treatment: SI; Medication Management Does patient have an ACCT team?: No Does patient have Intensive In-House Services?  : No Does patient have [facility] services? : No Does patient have P4CC services?: No  ADL Screening (condition at time of admission) Patient's cognitive ability adequate to safely complete daily activities?: Yes Patient able to express need for assistance with ADLs?: Yes Independently performs ADLs?: Yes (appropriate for developmental age)       Abuse/Neglect Assessment (Assessment to be complete while patient is alone) Abuse/Neglect Assessment Can Be Completed: Yes Physical Abuse: Denies Verbal Abuse: Denies Sexual Abuse: Denies Exploitation of patient/patient's resources: Denies Self-Neglect: Denies Values / Beliefs Cultural Requests During Hospitalization: None Spiritual Requests During Hospitalization: None Consults Spiritual Care Consult Needed: No Social Work Consult Needed: No Merchant navy officerAdvance Directives (For Healthcare) Does Patient Have a Medical Advance Directive?: No          Disposition:  Disposition Initial Assessment Completed for this Encounter: Yes  On Site Evaluation by:   Reviewed with Physician:    Asa SaunasShawanna N Seyed Heffley 09/02/2018 10:17 PM

## 2018-09-02 NOTE — ED Notes (Signed)
BEHAVIORAL HEALTH ROUNDING Patient sleeping: No. Patient alert and oriented: yes Behavior appropriate: Yes.  ; If no, describe:  Nutrition and fluids offered: yes Toileting and hygiene offered: Yes  Sitter present: q15 minute observations and security  monitoring Law enforcement present: Yes  ODS  

## 2018-09-02 NOTE — ED Notes (Signed)
VOL/ Consult ordered  

## 2018-09-02 NOTE — ED Notes (Signed)
Offered pt snack and drink, stated did not want anything.AS

## 2018-09-02 NOTE — ED Notes (Signed)

## 2018-09-02 NOTE — ED Provider Notes (Signed)
[facility] Emergency Department Provider Note   ____________________________________________   I have reviewed the triage vital signs and the nursing notes.   HISTORY  Chief Complaint Suicidal   History limited by: Not Limited   HPI Joel Arroyo is a 23 y.o. male who presents to the emergency department today because of concern for suicidal thoughts. The patient has been having these thoughts for a long time. States that he was recently admitted because of the thoughts but thinks he might have been released too soon. Continues to have SI. Tells me that he has not thought of a plan directly but was starting to wonder if he would do it. Only medical complaint at this time is for a laceration to the top of his left hand. States that he cut himself on a knife when he was unloading the dishwasher. Denies any intentional injury.    Records reviewed. Per medical record review patient has a history of depression, recent admission for depression.  Past Medical History:  Diagnosis Date  . Anxiety   . Asthma   . Bipolar 1 disorder ([facility])   . Depression   . Seizures [name](HCC)     Patient Active Problem List   Diagnosis Date Noted  . MDD (major depressive disorder), severe ([facility]) 08/17/2018  . Self-inflicted laceration of wrist 10/07/2017  . Cannabis abuse 10/07/2017  . Seizure-like activity ([facility])   . Psychosis ([facility])   . Seizures ([facility]) 09/06/2016  . Bipolar 2 disorder, major depressive episode ([facility]) 09/15/2015  . Suicidal ideation 09/15/2015  . Homicidal ideation 09/15/2015  . ADHD (attention deficit hyperactivity disorder) 09/14/2015  . Involuntary commitment 09/14/2015    Past Surgical History:  Procedure Laterality Date  . HERNIA REPAIR      Prior to Admission medications   Medication Sig Start Date End Date Taking? Authorizing Provider  albuterol (VENTOLIN HFA) 108 (90 Base) MCG/ACT inhaler Inhale 1 puff into the lungs every 4 (four) hours as needed  (SOB). 08/22/18   Clapacs, Jackquline DenmarkJohn T, MD  fluvoxaMINE (LUVOX) 100 MG tablet Take 1 tablet (100 mg total) by mouth at bedtime. 08/22/18   Clapacs, Jackquline DenmarkJohn T, MD  lurasidone (LATUDA) 40 MG TABS tablet Take 1 tablet (40 mg total) by mouth daily with supper. 08/22/18   Clapacs, Jackquline DenmarkJohn T, MD  oxcarbazepine (TRILEPTAL) 600 MG tablet Take 1 tablet (600 mg total) by mouth 2 (two) times daily. 08/22/18   Clapacs, Jackquline DenmarkJohn T, MD  VYVANSE 70 MG capsule Take 70 mg by mouth daily.  07/20/18   [provider]    Allergies Patient has no known allergies.  Family History  Problem Relation Age of Onset  . Kidney cancer Neg Hx   . Kidney disease Neg Hx   . Prostate cancer Neg Hx     Social History Social History   Tobacco Use  . Smoking status: Current Every Day Smoker    Packs/day: 0.50    Types: Cigarettes  . Smokeless tobacco: Never Used  Substance Use Topics  . Alcohol use: No  . Drug use: No    Review of Systems Constitutional: No fever/chills Eyes: No visual changes. ENT: No sore throat. Cardiovascular: Denies chest pain. Respiratory: Denies shortness of breath. Gastrointestinal: No abdominal pain.  No nausea, no vomiting.  No diarrhea.   Genitourinary: Negative for dysuria. Musculoskeletal: Negative for back pain. Skin: Positive for laceration to left hand.  Neurological: Negative for headaches, focal weakness or numbness.  ____________________________________________   PHYSICAL EXAM:  VITAL  SIGNS: ED Triage Vitals [09/02/18 1528]  Enc Vitals Group     BP (!) 127/92     Pulse Rate 89     Resp 16     Temp 98.8 F (37.1 C)     Temp Source Oral     SpO2 98 %     Weight 183 lb (83 kg)     Height 6' (1.829 m)     Head Circumference      Peak Flow      Pain Score 0   Constitutional: Alert and oriented.  Eyes: Conjunctivae are normal.  ENT      Head: Normocephalic and atraumatic.      Nose: No congestion/rhinnorhea.      Mouth/Throat: Mucous membranes are moist.      Neck:  No stridor. Hematological/Lymphatic/Immunilogical: No cervical lymphadenopathy. Cardiovascular: Normal rate, regular rhythm.  No murmurs, rubs, or gallops. Respiratory: Normal respiratory effort without tachypnea nor retractions. Breath sounds are clear and equal bilaterally. No wheezes/rales/rhonchi. Gastrointestinal: Soft and non tender. No rebound. No guarding.  Genitourinary: Deferred Musculoskeletal: Normal range of motion in all extremities. No lower extremity edema. Neurologic:  Normal speech and language. No gross focal neurologic deficits are appreciated.  Skin:  Positive for laceration to dorsum of left hand, roughly 1.5 cm Psychiatric: Mood and affect are normal. Speech and behavior are normal. Patient exhibits appropriate insight and judgment.  ____________________________________________    LABS (pertinent positives/negatives)  CBC wbc 8.8, hgb 16.9, plt 288 CMP wnl except t pro 8.4, alb 5.1 UDS positive amphetamines Acetaminophen, salicylate, ethanol below threwshold ____________________________________________   EKG  None  ____________________________________________    RADIOLOGY  None  ____________________________________________   PROCEDURES  Procedures  LACERATION REPAIR Performed by: Nance Pear Authorized by: Nance Pear Consent: Verbal consent obtained. Risks and benefits: risks, benefits and alternatives were discussed Consent given by: patient Patient identity confirmed: provided demographic data Prepped and Draped in normal sterile fashion Wound explored  Laceration Location: left hand, dorsum  Laceration Length: 1.5 cm  No Foreign Bodies seen or palpated  Anesthesia: local infiltration  Local anesthetic: lidocaine 1% without epinephrine  Anesthetic total: 2 ml  Irrigation method: syringe Amount of cleaning: standard  Skin closure: 5-0 vicryl rapide  Number of sutures: 3  Technique: simple interrupted  Patient  tolerance: Patient tolerated the procedure well with no immediate complications.  ____________________________________________   INITIAL IMPRESSION / ASSESSMENT AND PLAN / ED COURSE  Pertinent labs & imaging results that were available during my care of the patient were reviewed by me and considered in my medical decision making (see chart for details).   Patient presented to the emergency department today because of concerns for suicidal ideation.  Psychiatry did evaluate and will plan on admission.  Additionally patient suffered a 1.5 cm laceration to the top of his left hand.  This was closed with sutures.  Given the length that the wound was open and its location will place patient on prophylactic antibiotics.   ____________________________________________   FINAL CLINICAL IMPRESSION(S) / ED DIAGNOSES  Final diagnoses:  Laceration of left hand, foreign body presence unspecified, initial encounter  Depression, unspecified depression type     Note: This dictation was prepared with Dragon dictation. Any transcriptional errors that result from this process are unintentional     Nance Pear, MD 09/02/18 2048

## 2018-09-02 NOTE — Consult Note (Signed)
[facility] Face-to-Face Psychiatry Consult   Reason for Consult: Suicidal Ideation Referring Physician: Dr. Juliette Alcide Patient Identification: Joel Arroyo MRN:  [id number] Principal Diagnosis: <principal problem not specified> Diagnosis:  Active Problems:   * No active hospital problems. *   Total Time spent with patient: 1 hour  Subjective: "Yes,  I am suicidal."   Joel Arroyo is a 23 y.o. male patient presented to [facility] ED via POV and with legal guardian (parents).  The patient initial assessment done which he verbalize suicide ideation by hanging. 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  The patient was adopted at the age of 23 years old. The patient was seen face-to-face by this provider; chart reviewed and consulted with Dr. Derrill Kay on 09/02/2018 due to the care of the patient. It was discussed with the provider that the patient does meet criteria to be admitted to the psychiatric inpatient unit once a bed becomes available. On evaluation the patient is alert and oriented x 3, calm and cooperative, with depressed- affect. The patient does not appear to be responding to internal or external stimuli. Neither is the patient presenting with any delusional thinking. The patient denies auditory or visual hallucinations. The patient admits to suicidal and self-harm ideations with a plan to hang himself. He states whenever he is around his family. He states "my suicidal thoughts increases." The patient denies homicidal ideations.  The patient is not presenting with any psychotic or paranoid behaviors. During an encounter with the patient, he was able to answer all  questions appropriately.  Collateral was obtained by mother Maye Hides [phone] mobile) who expresses that the patient come into the hospital sees it as it being "a break for him."  Mom states at home they have chores and rules to  follow. When the patient feels he should not follow the house rules and chores.  He did voice suicidal ideations.She also discussed that the patient was in several group homes by the age of 23 years old to 89 or 23 years old. She expressed the patient currently seeks psychiatric and therapy services at [facility]. She states that lately the patient has caused a lot of issues within the family.  She states that the patient does not get any cigarettes for a week due to his behaviors at home.  And she believes he wants to come to the hospital to get the nicotine patch. Plan: The patient is a safety risk to self and currently is requiring psychiatric inpatient admission for stabilization and treatment.  HPI: Per Dr. Derrill Kay; Joel Arroyo is a 23 y.o. male who presents to the emergency department today because of concern for suicidal thoughts. The patient has been having these thoughts for a long time. States that he was recently admitted because of the thoughts but thinks he might have been released too soon. Continues to have SI. Tells me that he has not thought of a plan directly but was starting to wonder if he would do it. Only medical complaint at this time is for a laceration to the top of his left hand. States that he cut himself on a knife when he was unloading the dishwasher. Denies any intentional injury.  Past Psychiatric History:  Anxiety Bipolar 1 disorder (HCC) Depression  Risk to Self:  Yes Risk to Others:  No Prior Inpatient Therapy:  Yes Prior Outpatient Therapy:  Yes  Past  Medical History:  Past Medical History:  Diagnosis Date  . Anxiety   . Asthma   . Bipolar 1 disorder ([place])   . Depression   . Seizures ([place])     Past Surgical History:  Procedure Laterality Date  . HERNIA REPAIR     Family History:  Family History  Problem Relation Age of Onset  . Kidney cancer Neg Hx   . Kidney disease Neg Hx   . Prostate cancer Neg Hx    Family Psychiatric  History:   Depression Alcoholism Social History:  Social History   Substance and Sexual Activity  Alcohol Use No     Social History   Substance and Sexual Activity  Drug Use No    Social History   Socioeconomic History  . Marital status: Single    Spouse name: Not on file  . Number of children: Not on file  . Years of education: Not on file  . Highest education level: Not on file  Occupational History  . Not on file  Social Needs  . Financial resource strain: Not on file  . Food insecurity    Worry: Not on file    Inability: Not on file  . Transportation needs    Medical: Not on file    Non-medical: Not on file  Tobacco Use  . Smoking status: Current Every Day Smoker    Packs/day: 0.50    Types: Cigarettes  . Smokeless tobacco: Never Used  Substance and Sexual Activity  . Alcohol use: No  . Drug use: No  . Sexual activity: Not on file  Lifestyle  . Physical activity    Days per week: Not on file    Minutes per session: Not on file  . Stress: Not on file  Relationships  . Social Herbalist on phone: Not on file    Gets together: Not on file    Attends religious service: Not on file    Active member of club or organization: Not on file    Attends meetings of clubs or organizations: Not on file    Relationship status: Not on file  Other Topics Concern  . Not on file  Social History Narrative  . Not on file   Additional Social History:    Allergies:  No Known Allergies  Labs:  Results for orders placed or performed during the hospital encounter of 09/02/18 (from the past 48 hour(s))  Comprehensive metabolic panel     Status: Abnormal   Collection Time: 09/02/18  3:40 PM  Result Value Ref Range   Sodium 141 135 - 145 mmol/L   Potassium 3.7 3.5 - 5.1 mmol/L   Chloride 103 98 - 111 mmol/L   CO2 29 22 - 32 mmol/L   Glucose, Bld 92 70 - 99 mg/dL   BUN 19 6 - 20 mg/dL   Creatinine, Ser 1.10 0.61 - 1.24 mg/dL   Calcium 9.6 8.9 - 10.3 mg/dL   Total  Protein 8.4 (H) 6.5 - 8.1 g/dL   Albumin 5.1 (H) 3.5 - 5.0 g/dL   AST 32 15 - 41 U/L   ALT 40 0 - 44 U/L   Alkaline Phosphatase 78 38 - 126 U/L   Total Bilirubin 0.5 0.3 - 1.2 mg/dL   GFR calc non Af Amer >60 >60 mL/min   GFR calc Af Amer >60 >60 mL/min   Anion gap 9 5 - 15    Comment: Performed at [facility], [place]  [address] [place], [place] [address]  Ethanol     Status: None   Collection Time: 09/02/18  3:40 PM  Result Value Ref Range   Alcohol, Ethyl (B) <10 <10 mg/dL    Comment: (NOTE) Lowest detectable limit for serum alcohol is 10 mg/dL. For medical purposes only. Performed at [facility], [address] [place], [place] [address]   Salicylate level     Status: None   Collection Time: 09/02/18  3:40 PM  Result Value Ref Range   Salicylate Lvl <7.0 2.8 - 30.0 mg/dL    Comment: Performed at [facility], [address] [place], [place] [address]  Acetaminophen level     Status: Abnormal   Collection Time: 09/02/18  3:40 PM  Result Value Ref Range   Acetaminophen (Tylenol), Serum <10 (L) 10 - 30 ug/mL    Comment: (NOTE) Therapeutic concentrations vary significantly. A range of 10-30 ug/mL  may be an effective concentration for many patients. However, some  are best treated at concentrations outside of this range. Acetaminophen concentrations >150 ug/mL at 4 hours after ingestion  and >50 ug/mL at 12 hours after ingestion are often associated with  toxic reactions. Performed at [facility], [address] [place], [place] [address]   cbc     Status: None   Collection Time: 09/02/18  3:40 PM  Result Value Ref Range   WBC 8.8 4.0 - 10.5 K/uL   RBC 5.29 4.22 - 5.81 MIL/uL   Hemoglobin 16.9 13.0 - 17.0 g/dL   HCT 29.549.2 62.139.0 - 30.852.0 %   MCV 93.0 80.0 - 100.0 fL   MCH 31.9 26.0 - 34.0 pg   MCHC 34.3 30.0 - 36.0 g/dL   RDW 65.712.1 84.611.5 - 96.215.5 %   Platelets 288 150 - 400 K/uL   nRBC 0.0 0.0 - 0.2 %    Comment: Performed at  [facility], [address] [place], [place] [address]  Urine Drug Screen, Qualitative     Status: Abnormal   Collection Time: 09/02/18  3:40 PM  Result Value Ref Range   Tricyclic, Ur Screen NONE DETECTED NONE DETECTED   Amphetamines, Ur Screen POSITIVE (A) NONE DETECTED   MDMA (Ecstasy)Ur Screen NONE DETECTED NONE DETECTED   Cocaine Metabolite,Ur [place] NONE DETECTED NONE DETECTED   Opiate, Ur Screen NONE DETECTED NONE DETECTED   Phencyclidine (PCP) Ur S NONE DETECTED NONE DETECTED   Cannabinoid 50 Ng, Ur [place] NONE DETECTED NONE DETECTED   Barbiturates, Ur Screen NONE DETECTED NONE DETECTED   Benzodiazepine, Ur Scrn NONE DETECTED NONE DETECTED   Methadone Scn, Ur NONE DETECTED NONE DETECTED    Comment: (NOTE) Tricyclics + metabolites, urine    Cutoff 1000 ng/mL Amphetamines + metabolites, urine  Cutoff 1000 ng/mL MDMA (Ecstasy), urine              Cutoff 500 ng/mL Cocaine Metabolite, urine          Cutoff 300 ng/mL Opiate + metabolites, urine        Cutoff 300 ng/mL Phencyclidine (PCP), urine         Cutoff 25 ng/mL Cannabinoid, urine                 Cutoff 50 ng/mL Barbiturates + metabolites, urine  Cutoff 200 ng/mL Benzodiazepine, urine              Cutoff 200 ng/mL Methadone, urine  Cutoff 300 ng/mL The urine drug screen provides only a preliminary, unconfirmed analytical test result and should not be used for non-medical purposes. Clinical consideration and professional judgment should be applied to any positive drug screen result due to possible interfering substances. A more specific alternate chemical method must be used in order to obtain a confirmed analytical result. Gas chromatography / mass spectrometry (GC/MS) is the preferred confirmat ory method. Performed at [facility], [address] [place], [place] [address]     Current Facility-Administered Medications  Medication Dose Route Frequency Provider Last Rate Last Dose  .  cephALEXin (KEFLEX) capsule 500 mg  500 mg Oral Q6H Phineas SemenGoodman, Graydon, MD   500 mg at 09/02/18 1932   Current Outpatient Medications  Medication Sig Dispense Refill  . fluvoxaMINE (LUVOX) 100 MG tablet Take 1 tablet (100 mg total) by mouth at bedtime. 30 tablet 1  . lurasidone (LATUDA) 40 MG TABS tablet Take 1 tablet (40 mg total) by mouth daily with supper. 30 tablet 1  . oxcarbazepine (TRILEPTAL) 600 MG tablet Take 1 tablet (600 mg total) by mouth 2 (two) times daily. 60 tablet 1  . temazepam (RESTORIL) 7.5 MG capsule Take 7.5 mg by mouth at bedtime as needed for sleep.    Marland Kitchen. VYVANSE 70 MG capsule Take 70 mg by mouth daily.     Marland Kitchen. albuterol (VENTOLIN HFA) 108 (90 Base) MCG/ACT inhaler Inhale 1 puff into the lungs every 4 (four) hours as needed (SOB). 1 Inhaler 1  . cephALEXin (KEFLEX) 500 MG capsule Take 1 capsule (500 mg total) by mouth 4 (four) times daily for 10 days. 40 capsule 0    Musculoskeletal: Strength & Muscle Tone: within normal limits Gait & Station: normal Patient leans: N/A  Psychiatric Specialty Exam: Physical Exam  Nursing note and vitals reviewed. Constitutional: He is oriented to person, place, and time. He appears well-developed and well-nourished.  HENT:  Head: Normocephalic and atraumatic.  Eyes: Pupils are equal, round, and reactive to light. Conjunctivae and EOM are normal.  Neck: Normal range of motion. Neck supple.  Cardiovascular: Normal rate and regular rhythm.  Respiratory: Effort normal and breath sounds normal.  Musculoskeletal: Normal range of motion.  Neurological: He is alert and oriented to person, place, and time.  Skin: Skin is warm and dry.  Psychiatric: His behavior is normal.    Review of Systems  Psychiatric/Behavioral: Positive for depression and suicidal ideas. The patient is nervous/anxious.   All other systems reviewed and are negative.   Blood pressure (!) 127/92, pulse 89, temperature 98.8 F (37.1 C), temperature source Oral, resp.  rate 16, height 6' (1.829 m), weight 83 kg, SpO2 98 %.Body mass index is 24.82 kg/m.  General Appearance: Fairly Groomed  Eye Contact:  Poor  Speech:  Clear and Coherent  Volume:  Decreased  Mood:  Depressed  Affect:  Depressed  Thought Process:  Goal Directed  Orientation:  Full (Time, Place, and Person)  Thought Content:  Logical  Suicidal Thoughts:  Yes.  with intent/plan  Homicidal Thoughts:  No  Memory:  Immediate;   Good Recent;   Good  Judgement:  Poor  Insight:  Lacking  Psychomotor Activity:  Normal  Concentration:  Concentration: Fair and Attention Span: Fair  Recall:  Good  Fund of Knowledge:  Good  Language:  Good  Akathisia:  NA  Handed:  Right  AIMS (if indicated):     Assets:  Desire for Improvement Physical Health  ADL's:  Intact  Cognition:  Impaired,  Mild  Sleep:   okay     Treatment Plan Summary: Daily contact with patient to assess and evaluate symptoms and progress in treatment, Medication management and Plan The patient does meet criteria for psychiatric inpatient admission due to him having a plan to hang himself.  Disposition: The patient does meet criteria for psychiatric inpatient admission due to him having a plan to hang himself. and Patient will continue on all of his home medications.  Catalina GravelJacqueline Thomspon, NP 09/02/2018 10:02 PM

## 2018-09-03 ENCOUNTER — Other Ambulatory Visit: Payer: Self-pay

## 2018-09-03 ENCOUNTER — Inpatient Hospital Stay
Admission: AD | Admit: 2018-09-03 | Discharge: 2018-09-09 | DRG: 885 | Disposition: A | Discharge status: Home / Self Care | Payer: Medicaid Other | Attending: Internal Medicine | Admitting: Internal Medicine

## 2018-09-03 DIAGNOSIS — Z79899 Other long term (current) drug therapy: Secondary | ICD-10-CM

## 2018-09-03 DIAGNOSIS — F609 Personality disorder, unspecified: Secondary | ICD-10-CM | POA: Diagnosis present

## 2018-09-03 DIAGNOSIS — F17210 Nicotine dependence, cigarettes, uncomplicated: Secondary | ICD-10-CM | POA: Diagnosis present

## 2018-09-03 DIAGNOSIS — J45909 Unspecified asthma, uncomplicated: Secondary | ICD-10-CM | POA: Diagnosis present

## 2018-09-03 DIAGNOSIS — F339 Major depressive disorder, recurrent, unspecified: Secondary | ICD-10-CM | POA: Diagnosis present

## 2018-09-03 DIAGNOSIS — Z1159 Encounter for screening for other viral diseases: Secondary | ICD-10-CM | POA: Diagnosis not present

## 2018-09-03 DIAGNOSIS — Z915 Personal history of self-harm: Secondary | ICD-10-CM | POA: Diagnosis not present

## 2018-09-03 DIAGNOSIS — F410 Panic disorder [episodic paroxysmal anxiety] without agoraphobia: Secondary | ICD-10-CM | POA: Diagnosis present

## 2018-09-03 DIAGNOSIS — G4700 Insomnia, unspecified: Secondary | ICD-10-CM | POA: Diagnosis present

## 2018-09-03 DIAGNOSIS — S61412A Laceration without foreign body of left hand, initial encounter: Secondary | ICD-10-CM | POA: Diagnosis not present

## 2018-09-03 DIAGNOSIS — R45851 Suicidal ideations: Secondary | ICD-10-CM | POA: Diagnosis present

## 2018-09-03 DIAGNOSIS — F332 Major depressive disorder, recurrent severe without psychotic features: Secondary | ICD-10-CM | POA: Diagnosis present

## 2018-09-03 DIAGNOSIS — F909 Attention-deficit hyperactivity disorder, unspecified type: Secondary | ICD-10-CM | POA: Diagnosis present

## 2018-09-03 DIAGNOSIS — F322 Major depressive disorder, single episode, severe without psychotic features: Secondary | ICD-10-CM | POA: Diagnosis present

## 2018-09-03 DIAGNOSIS — F333 Major depressive disorder, recurrent, severe with psychotic symptoms: Secondary | ICD-10-CM | POA: Diagnosis not present

## 2018-09-03 LAB — SARS CORONAVIRUS 2 BY RT PCR (HOSPITAL ORDER, PERFORMED IN ~~LOC~~ HOSPITAL LAB): SARS Coronavirus 2: NEGATIVE

## 2018-09-03 MED ORDER — OXCARBAZEPINE 300 MG PO TABS
600.0000 mg | ORAL_TABLET | Freq: Two times a day (BID) | ORAL | Status: DC
  Administered 2018-09-03: 600 mg via ORAL

## 2018-09-03 MED ORDER — ACETAMINOPHEN 325 MG PO TABS
650.0000 mg | ORAL_TABLET | Freq: Four times a day (QID) | ORAL | Status: DC | PRN
  Administered 2018-09-06 – 2018-09-08 (×3): 650 mg via ORAL
  Filled 2018-09-03 (×3): qty 2

## 2018-09-03 MED ORDER — MAGNESIUM HYDROXIDE 400 MG/5ML PO SUSP: 30.0000 mL | Freq: Every day | ORAL | Status: DC | PRN

## 2018-09-03 MED ORDER — FLUVOXAMINE MALEATE 50 MG PO TABS: 100.0000 mg | ORAL_TABLET | Freq: Every day | ORAL | Status: DC

## 2018-09-03 MED ORDER — OXCARBAZEPINE 300 MG PO TABS
600.0000 mg | ORAL_TABLET | Freq: Two times a day (BID) | ORAL | Status: DC
  Administered 2018-09-04 – 2018-09-09 (×11): 600 mg via ORAL
  Filled 2018-09-03 (×12): qty 2

## 2018-09-03 MED ORDER — ALBUTEROL SULFATE (2.5 MG/3ML) 0.083% IN NEBU: 2.5000 mg | INHALATION_SOLUTION | RESPIRATORY_TRACT | Status: DC | PRN

## 2018-09-03 MED ORDER — TEMAZEPAM 7.5 MG PO CAPS: 7.5000 mg | ORAL_CAPSULE | Freq: Every evening | ORAL | Status: DC | PRN

## 2018-09-03 MED ORDER — ALBUTEROL SULFATE HFA 108 (90 BASE) MCG/ACT IN AERS
1.0000 | INHALATION_SPRAY | RESPIRATORY_TRACT | Status: DC | PRN
  Filled 2018-09-03: qty 6.7

## 2018-09-03 MED ORDER — LURASIDONE HCL 40 MG PO TABS
40.0000 mg | ORAL_TABLET | Freq: Every day | ORAL | Status: DC
  Administered 2018-09-04 – 2018-09-05 (×2): 40 mg via ORAL
  Filled 2018-09-03 (×3): qty 1

## 2018-09-03 MED ORDER — ALUM & MAG HYDROXIDE-SIMETH 200-200-20 MG/5ML PO SUSP: 30.0000 mL | ORAL | Status: DC | PRN

## 2018-09-03 MED ORDER — FLUVOXAMINE MALEATE 50 MG PO TABS
100.0000 mg | ORAL_TABLET | Freq: Every day | ORAL | Status: DC
  Administered 2018-09-03 – 2018-09-04 (×2): 100 mg via ORAL
  Filled 2018-09-03 (×2): qty 2

## 2018-09-03 MED ORDER — LURASIDONE HCL 40 MG PO TABS: 40.0000 mg | ORAL_TABLET | Freq: Every day | ORAL | Status: DC

## 2018-09-03 MED ORDER — CEPHALEXIN 500 MG PO CAPS: 500.0000 mg | ORAL_CAPSULE | Freq: Four times a day (QID) | ORAL | Status: DC

## 2018-09-03 MED ORDER — CEPHALEXIN 500 MG PO CAPS
500.0000 mg | ORAL_CAPSULE | Freq: Four times a day (QID) | ORAL | Status: DC
  Administered 2018-09-04 – 2018-09-09 (×22): 500 mg via ORAL
  Filled 2018-09-03 (×21): qty 1

## 2018-09-03 NOTE — ED Notes (Signed)
BEHAVIORAL HEALTH ROUNDING Patient sleeping: No. Patient alert and oriented: yes Behavior appropriate: Yes.  ; If no, describe:  Nutrition and fluids offered: yes Toileting and hygiene offered: Yes  Sitter present: q15 minute observations and security monitoring Law enforcement present: Yes    

## 2018-09-03 NOTE — ED Notes (Signed)
Pt brought to interview room by ODS to speak with APS representatives (2 people). Pt remains voluntary at this time. A&Ox4, calm and cooperative.

## 2018-09-03 NOTE — BH Assessment (Signed)
Writer spoke with patient's mother Olivia Mackie Stanley-[phone]). Updated her on patient's disposition, currently recommendation is for inpatient treatment. Informed her, he was doing well in the ER and currently denying SI/HI and AV/H. Writer discussed if patient was to continue to do well and no longer meet inpatient criteria, before getting a inpatient bed, he will be discharge. Mother voice her concerns about the patient being impulsive. She shared how he stabbed his self in the hand,on yesterday (09/02/2018), because he "got caught stealing my cigarettes." She also reports, the patient was seen by his therapist yesterday (09/02/2018) and he reported to them he was having SI and A/H with commands. She states she believes "he putting on a show for you guys... He need to be in the hospital."  Per patient's mother, he is NOT to get a nicotine patch. Writer updated patient's nurse.

## 2018-09-03 NOTE — Plan of Care (Signed)
Patient is a new admit a 23 year old young man who is known to [place] from his  previous from  encounter with this unit, patient is alert and oriented x 4 , appeared depressed and anxious, denies any suicide ideation at this time but admits suicide thoughts at the time he was at home , patient expressed lack of employment and lack of income is driving him crazy, patient said that he tested negative for COVID-19 , patient is searched by two staff and no contraband found and skin is clean. Unit guide lines and expected behaviors is explained , unit and room orientation is complete, food and beverage is offered and accepted , patient had no further complains, 15 minutes safety checks and camera monitoring in the hall ways is explained, admission assessment is complete.

## 2018-09-03 NOTE — Progress Notes (Signed)
Patient seen and reports he is "alright, just thoughts" of suicide at this time.  Reports being "bored" at home.  Smiling and playful at times with staff in the hallway, having fun.  Appears to have secondary gain of entertainment in the hospital, recently discharged from inpatient unit here.  No homicidal ideations or hallucinations.  "Just need help to get my mind together."  Reports compliance with medications, goes to [facility] and does have Peer Support there. Based on the fact he has no plan or intent to hurt himself, he will be discharged tomorrow unless inpatient is located.  Does not appear to be a threat to himself, will obtain collateral and patient notified of the plan.  Does not appear to have any imminent threat to himself.  Waylan Boga, PMHNP

## 2018-09-03 NOTE — ED Provider Notes (Signed)
-----------------------------------------   6:22 AM on 09/03/2018 -----------------------------------------   Blood pressure 112/78, pulse 79, temperature 98.2 F (36.8 C), temperature source Oral, resp. rate 17, height 6' (1.829 m), weight 83 kg, SpO2 98 %.  The patient is sleeping at this time.  There have been no acute events since the last update.  Awaiting disposition plan from Behavioral Medicine team.   Paulette Blanch, MD 09/03/18 [phone]

## 2018-09-03 NOTE — ED Notes (Signed)
VOL  PENDING  PLACEMENT / D/C

## 2018-09-03 NOTE — BH Assessment (Signed)
Referral information for Psychiatric Hospitalization faxed to;   [facility] [phone]),   . [facility] ([phone]),  . [place] [phone] or [phone])  . [facility] [phone], [phone], [phone] or [phone]),   . [place] [phone] or [phone])  . [facility] [phone]),   . [place] [phone]),   . [facility] [phone]).  [facility] [phone])

## 2018-09-03 NOTE — Tx Team (Signed)
Initial Treatment Plan 09/03/2018 8:09 PM Joel Arroyo RDE:[id number]    PATIENT STRESSORS: Financial difficulties Marital or family conflict Occupational concerns   PATIENT STRENGTHS: Capable of independent living General fund of knowledge Supportive family/friends   PATIENT IDENTIFIED PROBLEMS: Depressed/Anxiety    Suicidal thoughts    Bi-polar 1             DISCHARGE CRITERIA:  Adequate post-discharge living arrangements Medical problems require only outpatient monitoring Reduction of life-threatening or endangering symptoms to within safe limits  PRELIMINARY DISCHARGE PLAN: Attend PHP/IOP Participate in family therapy Return to previous living arrangement  PATIENT/FAMILY INVOLVEMENT: This treatment plan has been presented to and reviewed with the patient, Joel Arroyo  The patient have been given the opportunity to ask questions and make suggestions.  Clemens Catholic, RN 09/03/2018, 8:09 PM

## 2018-09-04 DIAGNOSIS — R45851 Suicidal ideations: Secondary | ICD-10-CM

## 2018-09-04 DIAGNOSIS — F333 Major depressive disorder, recurrent, severe with psychotic symptoms: Secondary | ICD-10-CM

## 2018-09-04 MED ORDER — NICOTINE 14 MG/24HR TD PT24
14.0000 mg | MEDICATED_PATCH | Freq: Every day | TRANSDERMAL | Status: DC
  Administered 2018-09-04 – 2018-09-09 (×6): 14 mg via TRANSDERMAL
  Filled 2018-09-04 (×6): qty 1
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
## 2018-09-04 NOTE — Plan of Care (Signed)
Patient alert and oriented. Patient endorses anxiety and depression. Patient denies SI/HI/AVH and pain at this time. Patient's goal for today is to take his medicine, attend groups and to get himself together. Patient observed outside this morning after breakfast playing basketball. Took and tolerated scheduled medications administered to patient, without any adverse reactions noted. Support and encouragement provided.  Routine safety checks conducted every 15 minutes.  Patient informed to notify staff with problems or concerns.
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
## 2018-09-04 NOTE — [facility] Group Notes (Signed)
LCSW Group Therapy Note   09/04/2018 1:15pm   Type of Therapy and Topic:  Group Therapy:  Trust and Honesty  Participation Level:  Active  Description of Group:    In this group patients will be asked to explore the value of being honest.  Patients will be guided to discuss their thoughts, feelings, and behaviors related to honesty and trusting in others. Patients will process together how trust and honesty relate to forming relationships with peers, family members, and self. Each patient will be challenged to identify and express feelings of being vulnerable. Patients will discuss reasons why people are dishonest and identify alternative outcomes if one was truthful (to self or others). This group will be process-oriented, with patients participating in exploration of their own experiences, giving and receiving support, and processing challenge from other group members.   Therapeutic Goals: 1. Patient will identify why honesty is important to relationships and how honesty overall affects relationships.  2. Patient will identify a situation where they lied or were lied too and the  feelings, thought process, and behaviors surrounding the situation 3. Patient will identify the meaning of being vulnerable, how that feels, and how that correlates to being honest with self and others. 4. Patient will identify situations where they could have told the truth, but instead lied and explain reasons of dishonesty.   Summary of Patient Progress The patient was able to explore the value of being honest.  Patient discussed thoughts, feelings, and behaviors related to honesty and trusting in others. The patient processed together with other group members how trust and honesty relate to forming relationships with peers, family members, and self. Pt actively and appropriately engaged in the group. Patient was able to provide support and validation to other group members. Patient practiced active listening when  interacting with the facilitator and other group members.    Therapeutic Modalities:   Cognitive Behavioral Therapy Solution Focused Therapy Motivational Interviewing Brief Therapy  Chayce Robbins  CUEBAS-COLON, LCSW 09/04/2018 4:00 PM  

## 2018-09-05 DIAGNOSIS — F339 Major depressive disorder, recurrent, unspecified: Secondary | ICD-10-CM | POA: Diagnosis present

## 2018-09-05 MED ORDER — HYDROXYZINE HCL 25 MG PO TABS: 25.0000 mg | ORAL_TABLET | Freq: Three times a day (TID) | ORAL | Status: DC | PRN

## 2018-09-05 MED ORDER — FLUVOXAMINE MALEATE 50 MG PO TABS
150.0000 mg | ORAL_TABLET | Freq: Every day | ORAL | Status: DC
  Administered 2018-09-05 – 2018-09-08 (×4): 150 mg via ORAL
  Filled 2018-09-05 (×4): qty 3

## 2018-09-05 MED ORDER — TRAZODONE HCL 50 MG PO TABS: 50.0000 mg | ORAL_TABLET | Freq: Every evening | ORAL | Status: DC | PRN

## 2018-09-05 NOTE — Plan of Care (Signed)
Patient alert and oriented. Patient endorses depression rating it a 7 and anxiety a 5. Patient denies SI/HI/AVH and pain at this time. Patient's goal for today is to take his medicine, attend groups and to start to understand himself. Took and tolerated scheduled medications administered to patient, without any adverse reactions noted. Support and encouragement provided.  Routine safety checks conducted every 15 minutes.  Patient informed to notify staff with problems or concerns.

## 2018-09-05 NOTE — [facility] Group Notes (Signed)
LCSW Group Therapy Note 09/05/2018 1:15pm  Type of Therapy and Topic: Group Therapy: Feelings Around Returning Home & Establishing a Supportive Framework and Supporting Oneself When Supports Not Available  Participation Level: Active  Description of Group:  Patients first processed thoughts and feelings about upcoming discharge. These included fears of upcoming changes, lack of change, new living environments, judgements and expectations from others and overall stigma of mental health issues. The group then discussed the definition of a supportive framework, what that looks and feels like, and how do to discern it from an unhealthy non-supportive network. The group identified different types of supports as well as what to do when your family/friends are less than helpful or unavailable  Therapeutic Goals  1. Patient will identify one healthy supportive network that they can use at discharge. 2. Patient will identify one factor of a supportive framework and how to tell it from an unhealthy network. 3. Patient able to identify one coping skill to use when they do not have positive supports from others. 4. Patient will demonstrate ability to communicate their needs through discussion and/or role plays.  Summary of Patient Progress:  The patient reported he feels "alright today." Pt engaged during group session. As patients processed their anxiety about discharge and described healthy supports patient shared he is not ready to be discharge. He stated, " because I'm still having visions about suicide." He listed his mom as his main support. Patients identified at least one self-care tool they were willing to use after discharge; focusing on what I have to do.    Therapeutic Modalities Cognitive Behavioral Therapy Motivational Interviewing   Temitope Flammer  CUEBAS-COLON, LCSW 09/05/2018 11:42 AM

## 2018-09-05 NOTE — [facility] Counselor (Signed)
CSW contacted patient's legal guardian Lajean Silvius at [phone] house and [phone] cell, no answer. CSW left voice mail message on Ms. Stanley's cell phone requesting a call back to update patient's information if necessary and to obtain verbal consent to reach out to [facility]-outpatient provider. CSW provided week CSW cell phone [phone].    [employer], LCSW, LCASA 09/05/2018

## 2018-09-05 NOTE — Progress Notes (Signed)
Patient has a sutured cut to L) hand that was glued superficially. Patient states, "While I was playing basketball I must have hit it and the glue has come a part." Sutures remains intact but upon assessment the small cut was noted to be partially open on the top layer of skin. Patient asked to wash his hands and this writer put a band-aid  over the area. Will inform on-coming shift of the aforementioned.

## 2018-09-05 NOTE — Plan of Care (Signed)
  Problem: Education: Goal: Ability to make informed decisions regarding treatment will improve 09/05/2018 0013 by Clemens Catholic, RN Outcome: Progressing 09/05/2018 0004 by Clemens Catholic, RN Outcome: Progressing   Problem: Coping: Goal: Coping ability will improve 09/05/2018 0013 by Clemens Catholic, RN Outcome: Progressing 09/05/2018 0004 by Clemens Catholic, RN Outcome: Progressing   Problem: Health Behavior/Discharge Planning: Goal: Identification of resources available to assist in meeting health care needs will improve 09/05/2018 0013 by Clemens Catholic, RN Outcome: Progressing 09/05/2018 0004 by Clemens Catholic, RN Outcome: Progressing   Problem: Medication: Goal: Compliance with prescribed medication regimen will improve 09/05/2018 0013 by Clemens Catholic, RN Outcome: Progressing 09/05/2018 0004 by Clemens Catholic, RN Outcome: Progressing   Problem: Self-Concept: Goal: Ability to disclose and discuss suicidal ideas will improve 09/05/2018 0013 by Clemens Catholic, RN Outcome: Progressing 09/05/2018 0004 by Clemens Catholic, RN Outcome: Progressing Goal: Will verbalize positive feelings about self 09/05/2018 0013 by Clemens Catholic, RN Outcome: Progressing 09/05/2018 0004 by Clemens Catholic, RN Outcome: Progressing   Problem: Education: Goal: Utilization of techniques to improve thought processes will improve 09/05/2018 0013 by Clemens Catholic, RN Outcome: Progressing 09/05/2018 0004 by Clemens Catholic, RN Outcome: Progressing Goal: Knowledge of the prescribed therapeutic regimen will improve 09/05/2018 0013 by Clemens Catholic, RN Outcome: Progressing 09/05/2018 0004 by Clemens Catholic, RN Outcome: Progressing   Problem: Activity: Goal: Interest or engagement in leisure activities will improve 09/05/2018 0013 by Clemens Catholic, RN Outcome: Progressing 09/05/2018 0004 by Clemens Catholic, RN Outcome: Progressing Goal:  Imbalance in normal sleep/wake cycle will improve 09/05/2018 0013 by Clemens Catholic, RN Outcome: Progressing 09/05/2018 0004 by Clemens Catholic, RN Outcome: Progressing   Problem: Coping: Goal: Coping ability will improve 09/05/2018 0013 by Clemens Catholic, RN Outcome: Progressing 09/05/2018 0004 by Clemens Catholic, RN Outcome: Progressing Goal: Will verbalize feelings 09/05/2018 0013 by Clemens Catholic, RN Outcome: Progressing 09/05/2018 0004 by Clemens Catholic, RN Outcome: Progressing   Problem: Health Behavior/Discharge Planning: Goal: Ability to make decisions will improve 09/05/2018 0013 by Clemens Catholic, RN Outcome: Progressing 09/05/2018 0004 by Clemens Catholic, RN Outcome: Progressing Goal: Compliance with therapeutic regimen will improve 09/05/2018 0013 by Clemens Catholic, RN Outcome: Progressing 09/05/2018 0004 by Clemens Catholic, RN Outcome: Progressing   Problem: Role Relationship: Goal: Will demonstrate positive changes in social behaviors and relationships 09/05/2018 0013 by Clemens Catholic, RN Outcome: Progressing 09/05/2018 0004 by Clemens Catholic, RN Outcome: Progressing   Problem: Safety: Goal: Ability to disclose and discuss suicidal ideas will improve 09/05/2018 0013 by Clemens Catholic, RN Outcome: Progressing 09/05/2018 0004 by Clemens Catholic, RN Outcome: Progressing Goal: Ability to identify and utilize support systems that promote safety will improve 09/05/2018 0013 by Clemens Catholic, RN Outcome: Progressing 09/05/2018 0004 by Clemens Catholic, RN Outcome: Progressing   Problem: Self-Concept: Goal: Will verbalize positive feelings about self 09/05/2018 0013 by Clemens Catholic, RN Outcome: Progressing 09/05/2018 0004 by Clemens Catholic, RN Outcome: Progressing Goal: Level of anxiety will decrease 09/05/2018 0013 by Clemens Catholic, RN Outcome: Progressing 09/05/2018 0004 by Clemens Catholic,  RN Outcome: Progressing

## 2018-09-05 NOTE — Plan of Care (Signed)
Patient is socially active and adjusting well in the unit , patient attends scheduled social groups with peers and maintaining safety and social distancing accordingly , vital signs are stable , appetite is good  and well hydrated, compliant with his medication regimen , sleep is long with out any interruptions , patient denies any SI/HI/AVH  and has no further complains and no acute distress. 15 minutes safety checks is in progress.

## 2018-09-05 NOTE — Progress Notes (Signed)
[facility] MD Progress Note  09/05/2018 11:03 AM Joel Arroyo  MRN:  [id number] Subjective:  Patient is a 23 year old male with a past psychiatric history significant for major depression as well as a history of fetal alcohol syndrome and attention deficit disorder. He was admitted on 09/02/2018 after developing suicidal ideation with plan of hanging himself.  Objective: Patient is seen and examined.  Patient is a 23 year old male with the above-stated past psychiatric history who is seen in follow-up.  He stated he continues to have "visions" of him committing suicide.  They do not appear to be visual hallucinations in and of itself, but more of a illusionary type.  He also stated he continues to be suicidal.  He sees himself hanging himself.  We discussed how long he had been suicidal, and he basically was unable to remember any time when he was not suicidal.  Prior to examining the patient he had been sitting in the day room watching television with his peers, and appeared to be quite stable.  Review of the nursing notes showed that he was felt to be socially active and adjusting well on the unit.  He attended all groups, and was maintaining safety and social distancing.  At least last night to nursing staff the patient denied suicidal ideation, homicidal ideation and auditory or visual hallucinations.  His vital signs are stable, he is afebrile.  He slept 7.25 hours last night.  Principal Problem: <principal problem not specified> Diagnosis: Active Problems:   MDD (major depressive disorder), severe (HCC)  Total Time spent with patient: 20 minutes  Past Psychiatric History: See admission H&P  Past Medical History:  Past Medical History:  Diagnosis Date  . Anxiety   . Asthma   . Bipolar 1 disorder ([place])   . Depression   . Seizures ([place])     Past Surgical History:  Procedure Laterality Date  . HERNIA REPAIR     Family History:  Family History  Problem Relation Age of Onset  . Kidney  cancer Neg Hx   . Kidney disease Neg Hx   . Prostate cancer Neg Hx    Family Psychiatric  History: See admission H&P Social History:  Social History   Substance and Sexual Activity  Alcohol Use No     Social History   Substance and Sexual Activity  Drug Use No    Social History   Socioeconomic History  . Marital status: Single    Spouse name: Not on file  . Number of children: Not on file  . Years of education: Not on file  . Highest education level: Not on file  Occupational History  . Not on file  Social Needs  . Financial resource strain: Not on file  . Food insecurity    Worry: Not on file    Inability: Not on file  . Transportation needs    Medical: Not on file    Non-medical: Not on file  Tobacco Use  . Smoking status: Current Every Day Smoker    Packs/day: 0.50    Types: Cigarettes  . Smokeless tobacco: Never Used  Substance and Sexual Activity  . Alcohol use: No  . Drug use: No  . Sexual activity: Not on file  Lifestyle  . Physical activity    Days per week: Not on file    Minutes per session: Not on file  . Stress: Not on file  Relationships  . Social connections    Talks on phone: Not on file  Gets together: Not on file    Attends religious service: Not on file    Active member of club or organization: Not on file    Attends meetings of clubs or organizations: Not on file    Relationship status: Not on file  Other Topics Concern  . Not on file  Social History Narrative  . Not on file   Additional Social History:                         Sleep: Good  Appetite:  Good  Current Medications: Current Facility-Administered Medications  Medication Dose Route Frequency Provider Last Rate Last Dose  . acetaminophen (TYLENOL) tablet 650 mg  650 mg Oral Q6H PRN Thomspon, Adela LankJacqueline, NP      . albuterol (VENTOLIN HFA) 108 (90 Base) MCG/ACT inhaler 1 puff  1 puff Inhalation Q4H PRN Thomspon, Adela LankJacqueline, NP      . alum & mag  hydroxide-simeth (MAALOX/MYLANTA) 200-200-20 MG/5ML suspension 30 mL  30 mL Oral Q4H PRN Thomspon, Adela LankJacqueline, NP      . cephALEXin (KEFLEX) capsule 500 mg  500 mg Oral Q6H Thomspon, Jacqueline, NP   500 mg at 09/05/18 0600  . fluvoxaMINE (LUVOX) tablet 100 mg  100 mg Oral QHS Thomspon, Adela LankJacqueline, NP   100 mg at 09/04/18 2131  . lurasidone (LATUDA) tablet 40 mg  40 mg Oral Q supper Catalina Gravelhomspon, Jacqueline, NP   40 mg at 09/04/18 1737  . magnesium hydroxide (MILK OF MAGNESIA) suspension 30 mL  30 mL Oral Daily PRN Catalina Gravelhomspon, Jacqueline, NP      . nicotine (NICODERM CQ - dosed in mg/24 hours) patch 14 mg  14 mg Transdermal Daily Antonieta Pertlary, Greg Lawson, MD   14 mg at 09/05/18 16100812  . Oxcarbazepine (TRILEPTAL) tablet 600 mg  600 mg Oral BID Catalina Gravelhomspon, Jacqueline, NP   600 mg at 09/05/18 0811  . temazepam (RESTORIL) capsule 7.5 mg  7.5 mg Oral QHS PRN Catalina Gravelhomspon, Jacqueline, NP        Lab Results: No results found for this or any previous visit (from the past 48 hour(s)).  Blood Alcohol level:  Lab Results  Component Value Date   ETH <10 09/02/2018   ETH <10 08/17/2018    Metabolic Disorder Labs: Lab Results  Component Value Date   HGBA1C 5.3 09/05/2016   MPG 105 09/05/2016   No results found for: PROLACTIN No results found for: CHOL, TRIG, HDL, CHOLHDL, VLDL, LDLCALC  Physical Findings: AIMS: Facial and Oral Movements Muscles of Facial Expression: None, normal Lips and Perioral Area: None, normal Jaw: None, normal Tongue: None, normal,Extremity Movements Upper (arms, wrists, hands, fingers): None, normal Lower (legs, knees, ankles, toes): None, normal, Trunk Movements Neck, shoulders, hips: None, normal, Overall Severity Severity of abnormal movements (highest score from questions above): None, normal Incapacitation due to abnormal movements: None, normal Patient's awareness of abnormal movements (rate only patient's report): No Awareness, Dental Status Current problems with teeth and/or  dentures?: No Does patient usually wear dentures?: No  CIWA:  CIWA-Ar Total: 0 COWS:  COWS Total Score: 2  Musculoskeletal: Strength & Muscle Tone: within normal limits Gait & Station: normal Patient leans: N/A  Psychiatric Specialty Exam: Physical Exam  Nursing note and vitals reviewed. Constitutional: He is oriented to person, place, and time. He appears well-developed and well-nourished.  HENT:  Head: Normocephalic and atraumatic.  Respiratory: Effort normal.  Neurological: He is alert and oriented to person, place, and time.  ROS  Blood pressure 113/75, pulse 81, temperature 97.6 F (36.4 C), temperature source Oral, resp. rate 18, height 6' (1.829 m), weight 86.2 kg, SpO2 98 %.Body mass index is 25.77 kg/m.  General Appearance: Casual  Eye Contact:  Good  Speech:  Pressured  Volume:  Increased  Mood:  Anxious  Affect:  Congruent  Thought Process:  Coherent and Descriptions of Associations: Loose  Orientation:  Full (Time, Place, and Person)  Thought Content:  Ilusions  Suicidal Thoughts:  Yes.  without intent/plan  Homicidal Thoughts:  No  Memory:  Immediate;   Fair Recent;   Fair Remote;   Fair  Judgement:  Impaired  Insight:  Lacking  Psychomotor Activity:  Increased  Concentration:  Concentration: Fair and Attention Span: Fair  Recall:  [employer]Fair  Fund of Knowledge:  Fair  Language:  Fair  Akathisia:  Negative  Handed:  Right  AIMS (if indicated):     Assets:  Desire for Improvement Resilience  ADL's:  Intact  Cognition:  WNL  Sleep:  Number of Hours: 7.25     Treatment Plan Summary: Daily contact with patient to assess and evaluate symptoms and progress in treatment, Medication management and Plan : Patient is seen and examined.  Patient is a [age] year old male with the above stated past psychiatric history who is seen in follow-up.   Diagnosis: #1 major depression, recurrent, severe without psychotic features, #2 history of fetal alcohol syndrome, #3  history of attention deficit disorder, #4 unspecified personality disorder, #5 unspecified psychosis  Patient is seen and examined.  Patient is a [age] year old male with the above-stated past psychiatric history who is seen in follow-up.  It is a rather odd presentation.  Clearly his suicidal thoughts are a chronic issue, and to me he states he is always suicidal, but then denied acute suicidality to staff.  It is unclear how much his fetal alcohol syndrome cognitive impairment may be leading to his ability to understand the symptoms more clearly.  He clearly could benefit cognitive behavioral therapy as well as dialectic behavioral therapy once he is discharged.  He continues on Latuda, Trileptal, temazepam and Luvox.  I am going to increase his Luvox 250 mg p.o. nightly.  I will maintain his current level of Latuda, Trileptal and temazepam.  Hopefully this will be beneficial. 1.  Continue Keflex 500 mg p.o. every 6 hours for skin infection. 2.  Increase Luvox 250 mg p.o. nightly for mood, anxiety and obsessive type thinking. 3.  Continue Latuda 40 mg p.o. daily with supper for mood stability and psychosis. 5.  Continue Trileptal 600 mg p.o. twice daily for mood stability. 6.  Continue temazepam 7.5 mg p.o. nightly as needed insomnia. 7.  Disposition planning-in progress.  Antonieta PertGreg Lawson Clary, MD 09/05/2018, 11:03 AM

## 2018-09-06 ENCOUNTER — Encounter: Payer: Self-pay | Admitting: Internal Medicine

## 2018-09-06 DIAGNOSIS — F322 Major depressive disorder, single episode, severe without psychotic features: Secondary | ICD-10-CM

## 2018-09-06 MED ORDER — LURASIDONE HCL 80 MG PO TABS
80.0000 mg | ORAL_TABLET | Freq: Every day | ORAL | Status: DC
  Administered 2018-09-06 – 2018-09-08 (×3): 80 mg via ORAL
  Filled 2018-09-06 (×4): qty 1

## 2018-09-06 NOTE — Progress Notes (Signed)
Recreation Therapy Notes  INPATIENT RECREATION THERAPY ASSESSMENT  Patient Details Name: RAYON MCCHRISTIAN MRN: [id number] DOB: [birth year] Today's Date: 09/06/2018       Information Obtained From: Patient  Able to Participate in Assessment/Interview: Yes  Patient Presentation: Responsive  Reason for Admission (Per Patient): Active Symptoms, Suicidal Ideation, Suicide Attempt  Patient Stressors: Family  Coping Skills:   Music  Leisure Interests (2+):  Sports - Basketball  Frequency of Recreation/Participation: Monthly  Awareness of Community Resources:  Yes  Community Resources:  Other (Comment)([place])  Current Use: Yes  If no, Barriers?:    Expressed Interest in [employer] Information:    [place] of Residence:  [place]  Patient Main Form of Transportation: Other (Comment)(Peer support)  Patient Strengths:  Smart  Patient Identified Areas of Improvement:  My thinking  Patient Goal for Hospitalization:  Stop having suicidal visions  Current SI (including self-harm):  No  Current HI:  No  Current AVH: No  Staff Intervention Plan: Group Attendance, Collaborate with Interdisciplinary Treatment Team  Consent to Intern Participation: N/A  Rosmary Dionisio 09/06/2018, 11:03 AM

## 2018-09-06 NOTE — Plan of Care (Signed)
Patient verbalized his depression 7/10 and anxiety 5/10.Stated positive for passive suicidal thoughts with no plans.Denies HI and AVH.Patient is pleasant and cooperative in the unit.Appropriate with staff.Compliant with medications.Appetite and energy level good.Attended groups.Support and encouragement given.

## 2018-09-06 NOTE — Consult Note (Signed)
[employer] - [place] [facility]   PATIENT NAME: Joel Arroyo    MR#:  [id number]  DATE OF BIRTH:  11/25/1995  DATE OF ADMISSION:  09/03/2018  PRIMARY CARE PHYSICIAN: System, Pcp Not In   CONSULT REQUESTING/REFERRING PHYSICIAN: Dr. Toni Amendlapacs  REASON FOR CONSULT: Left hand wound and discharge  CHIEF COMPLAINT:  No chief complaint on file. Suicidal ideation  HISTORY OF PRESENT ILLNESS:  Joel Arroyo  is a 23 y.o. male with a known history of seizures, bipolar disorder, fetal alcohol syndrome, attention deficit disorder admitted to behavioral health unit due to suicidal ideation who seems to be slowly improving had discharge from dorsum of his left hand where he had a wound and had sutures placed.  Presently is on Keflex orally.  Afebrile.  Feels well overall.  He tells me he had discharge earlier than soak this in water.  No discharge or redness at this time.  Not tender.  Is able to make a fist.  Afebrile.  No joint swelling or redness.  PAST MEDICAL HISTORY:   Past Medical History:  Diagnosis Date  . Anxiety   . Asthma   . Bipolar 1 disorder (HCC)   . Depression   . Seizures (HCC)     PAST SURGICAL HISTOIRY:   Past Surgical History:  Procedure Laterality Date  . HERNIA REPAIR      SOCIAL HISTORY:   Social History   Tobacco Use  . Smoking status: Current Every Day Smoker    Packs/day: 0.50    Types: Cigarettes  . Smokeless tobacco: Never Used  Substance Use Topics  . Alcohol use: No    FAMILY HISTORY:   Family History  Problem Relation Age of Onset  . Kidney cancer Neg Hx   . Kidney disease Neg Hx   . Prostate cancer Neg Hx     DRUG ALLERGIES:  No Known Allergies  REVIEW OF SYSTEMS:   ROS  CONSTITUTIONAL: No fever, fatigue or weakness.  EYES: No blurred or double vision.  EARS, NOSE, AND THROAT: No tinnitus or ear pain.  RESPIRATORY: No cough, shortness of breath, wheezing or hemoptysis.  CARDIOVASCULAR: No chest pain,  orthopnea, edema.  GASTROINTESTINAL: No nausea, vomiting, diarrhea or abdominal pain.  GENITOURINARY: No dysuria, hematuria.  ENDOCRINE: No polyuria, nocturia,  HEMATOLOGY: No anemia, easy bruising or bleeding SKIN: No rash or lesion. MUSCULOSKELETAL: No joint pain or arthritis.   NEUROLOGIC: No tingling, numbness, weakness.  PSYCHIATRY: Depression present  MEDICATIONS AT HOME:   Prior to Admission medications   Medication Sig Start Date End Date Taking? Authorizing Provider  albuterol (VENTOLIN HFA) 108 (90 Base) MCG/ACT inhaler Inhale 1 puff into the lungs every 4 (four) hours as needed (SOB). 08/22/18   Clapacs, Jackquline DenmarkJohn T, MD  cephALEXin (KEFLEX) 500 MG capsule Take 1 capsule (500 mg total) by mouth 4 (four) times daily for 10 days. 09/02/18 09/12/18  Phineas SemenGoodman, Graydon, MD  fluvoxaMINE (LUVOX) 100 MG tablet Take 1 tablet (100 mg total) by mouth at bedtime. 08/22/18   Clapacs, Jackquline DenmarkJohn T, MD  lurasidone (LATUDA) 40 MG TABS tablet Take 1 tablet (40 mg total) by mouth daily with supper. 08/22/18   Clapacs, Jackquline DenmarkJohn T, MD  oxcarbazepine (TRILEPTAL) 600 MG tablet Take 1 tablet (600 mg total) by mouth 2 (two) times daily. 08/22/18   Clapacs, Jackquline DenmarkJohn T, MD  temazepam (RESTORIL) 7.5 MG capsule Take 7.5 mg by mouth at bedtime as needed for sleep.    [provider]  VYVANSE 70  MG capsule Take 70 mg by mouth daily.  07/20/18   [provider]      VITAL SIGNS:  Blood pressure 122/80, pulse 70, temperature 98.4 F (36.9 C), temperature source Oral, resp. rate 18, height 6' (1.829 m), weight 86.2 kg, SpO2 97 %.  PHYSICAL EXAMINATION:  GENERAL:  23 y.o.-year-old patient lying in the bed with no acute distress.  EYES: Pupils equal, round, reactive to light and accommodation. No scleral icterus. Extraocular muscles intact.  HEENT: Head atraumatic, normocephalic. Oropharynx and nasopharynx clear.  NECK:  Supple, no jugular venous distention. No thyroid enlargement, no tenderness.  LUNGS: Normal  breath sounds bilaterally, no wheezing, rales,rhonchi or crepitation. No use of accessory muscles of respiration.  CARDIOVASCULAR: S1, S2 normal. No murmurs, rubs, or gallops.  ABDOMEN: Soft, nontender, nondistended. Bowel sounds present. No organomegaly or mass.  EXTREMITIES: No pedal edema, cyanosis, or clubbing.  NEUROLOGIC: Cranial nerves II through XII are intact. Muscle strength 5/5 in all extremities. Sensation intact. Gait not checked.  PSYCHIATRIC: The patient is alert and oriented x 3.  SKIN: Dorsum of left hand has 2 cm approximated sutured wound.  No fluctuance found.  I tried to express for any pus and the wound is dry.  No significant tenderness or redness found.  LABORATORY PANEL:   CBC Recent Labs  Lab 09/02/18 1540  WBC 8.8  HGB 16.9  HCT 49.2  PLT 288   ------------------------------------------------------------------------------------------------------------------  Chemistries  Recent Labs  Lab 09/02/18 1540  NA 141  K 3.7  CL 103  CO2 29  GLUCOSE 92  BUN 19  CREATININE 1.10  CALCIUM 9.6  AST 32  ALT 40  ALKPHOS 78  BILITOT 0.5   ------------------------------------------------------------------------------------------------------------------  Cardiac Enzymes No results for input(s): TROPONINI in the last 168 hours. ------------------------------------------------------------------------------------------------------------------  RADIOLOGY:  No results found.  EKG:   Orders placed or performed during the hospital encounter of 10/07/17  . ED EKG  . ED EKG  . EKG 12-Lead  . EKG 12-Lead  . EKG    IMPRESSION AND PLAN:   *Left hand dorsum sutured wound on oral antibiotics.  No pus or redness found.  Would finish antibiotic course and monitor.  No need for any surgical intervention or IV antibiotics.  *Suicidal ideation.  Per psychiatry team.  All the records are reviewed and case discussed with Consulting provider. Management plans  discussed with the patient, family and they are in agreement.   TOTAL TIME TAKING CARE OF THIS PATIENT: 35 minutes.    Neita Carp M.D on 09/06/2018 at 4:47 PM  Between 7am to 6pm - Pager - [phone]  After 6pm go to [email or web address] - password [email or web address] [facility]  Office  [phone]  CC: Primary care Physician: System, Pcp Not In     Note: This dictation was prepared with Dragon dictation along with smaller phrase technology. Any transcriptional errors that result from this process are unintentional.

## 2018-09-06 NOTE — BHH Counselor (Signed)
CSW faxed releases to the patient's guardian Joel Arroyo at [phone].  CSW received confirmation of receipt.  Assunta Curtis, MSW, LCSW 09/06/2018 9:27 AM

## 2018-09-06 NOTE — B[facility] Group Notes (Signed)
LCSW Group Therapy Note   09/06/2018 1:00 PM   Type of Therapy and Topic:  Group Therapy:  Overcoming Obstacles   Participation Level:  Active   Description of Group:    In this group patients will be encouraged to explore what they see as obstacles to their own wellness and recovery. They will be guided to discuss their thoughts, feelings, and behaviors related to these obstacles. The group will process together ways to cope with barriers, with attention given to specific choices patients can make. Each patient will be challenged to identify changes they are motivated to make in order to overcome their obstacles. This group will be process-oriented, with patients participating in exploration of their own experiences as well as giving and receiving support and challenge from other group members.   Therapeutic Goals: 1. Patient will identify personal and current obstacles as they relate to admission. 2. Patient will identify barriers that currently interfere with their wellness or overcoming obstacles.  3. Patient will identify feelings, thought process and behaviors related to these barriers. 4. Patient will identify two changes they are willing to make to overcome these obstacles:      Summary of Patient Progress Pt was appropriate and respectful in group. Pt was able to identify his mental health, suicidal thoughts, and moods as current obstacles for him. Pt stated that he feels like his family gave up on him and that his living situation is questionable. Pt reported that talking with others, social interactions, and getting a job are things he plans to do to overcome his current obstacles.      Therapeutic Modalities:   Cognitive Behavioral Therapy Solution Focused Therapy Motivational Interviewing Relapse Prevention Therapy  Evalina Field, MSW, LCSW Clinical Social Work 09/06/2018 1:00 PM

## 2018-09-06 NOTE — Progress Notes (Signed)
D: Pt during assessments denies SI/HI/AVH, able to contract for safety. Pt is pleasant and cooperative. Pt. has no Complaints. Pt. Endorses a normal mood.   A: Q x 15 minute observation checks were completed for safety. Patient was provided with education. Patient was given/offered medications per orders. Patient  was encourage to attend groups, participate in unit activities and continue with plan of care. Pt. Chart and plans of care reviewed. Pt. Given support and encouragement.   R: Patient is complaint with medication and unit procedures. Pt. Attends snack time, observed eating good. Pt. Interactions with staff and peers are appropriate.             Precautionary checks every 15 minutes for safety maintained, room free of safety hazards, patient sustains no injury or falls during this shift. Will endorse care to next shift.

## 2018-09-06 NOTE — Progress Notes (Signed)
Recreation Therapy Notes   Date: 09/06/2018  Time: 9:30 am   Location: Craft room   Behavioral response: N/A   Intervention Topic: Necessities  Discussion/Intervention: Patient did not attend group.   Clinical Observations/Feedback:  Patient did not attend group.   Charlisa Cham LRT/CTRS        Audie Wieser 09/06/2018 10:22 AM 

## 2018-09-06 NOTE — Progress Notes (Signed)
[facility]  09/06/2018 5:16 PM Joel Arroyo  MRN:  [id number] Subjective: Patient seen chart reviewed.  Patient who had only recently been discharged from the hospital.  Comes back saying he is having "visions" of ways to kill himself.  Mostly happens just at nighttime.  Does not appear to be responding to internal stimuli or having true hallucinations during the day.  Clearly continues to have a lot of stress in his relationship with his family of origin.  Sounds like they all Faucette each other making for a very unsupportive environment.  Patient is complaining of the sore on the back of his hand being more painful and swollen and thinks that it is oozing.  I will ask the hospitalist to take a look at that. Principal Problem: MDD (major depressive disorder), severe (HCC) Diagnosis: Principal Problem:   MDD (major depressive disorder), severe (HCC) Active Problems:   ADHD (attention deficit hyperactivity disorder)   Suicidal ideation   Major depression, recurrent (HCC)  Total Time spent with patient: 30 minutes  Past Psychiatric History: Patient has a history of chronic mood instability and behavior problems  Past Medical History:  Past Medical History:  Diagnosis Date  . Anxiety   . Asthma   . Bipolar 1 disorder (HCC)   . Depression   . Seizures (HCC)     Past Surgical History:  Procedure Laterality Date  . HERNIA REPAIR     Family History:  Family History  Problem Relation Age of Onset  . Kidney cancer Neg Hx   . Kidney disease Neg Hx   . Prostate cancer Neg Hx    Family Psychiatric  History: See previous Social History:  Social History   Substance and Sexual Activity  Alcohol Use No     Social History   Substance and Sexual Activity  Drug Use No    Social History   Socioeconomic History  . Marital status: Single    Spouse name: Not on file  . Number of children: Not on file  . Years of education: Not on file  . Highest education level: Not on  file  Occupational History  . Not on file  Social Needs  . Financial resource strain: Not on file  . Food insecurity    Worry: Not on file    Inability: Not on file  . Transportation needs    Medical: Not on file    Non-medical: Not on file  Tobacco Use  . Smoking status: Current Every Day Smoker    Packs/day: 0.50    Types: Cigarettes  . Smokeless tobacco: Never Used  Substance and Sexual Activity  . Alcohol use: No  . Drug use: No  . Sexual activity: Not on file  Lifestyle  . Physical activity    Days per week: Not on file    Minutes per session: Not on file  . Stress: Not on file  Relationships  . Social Musicianconnections    Talks on phone: Not on file    Gets together: Not on file    Attends religious service: Not on file    Active member of club or organization: Not on file    Attends meetings of clubs or organizations: Not on file    Relationship status: Not on file  Other Topics Concern  . Not on file  Social History Narrative  . Not on file   Additional Social History:  Sleep: Fair  Appetite:  Fair  Current Medications: Current Facility-Administered Medications  Medication Dose Route Frequency Provider Last Rate Last Dose  . acetaminophen (TYLENOL) tablet 650 mg  650 mg Oral Q6H PRN Lamont Dowdy, NP   650 mg at 09/06/18 1420  . albuterol (VENTOLIN HFA) 108 (90 Base) MCG/ACT inhaler 1 puff  1 puff Inhalation Q4H PRN Lamont Dowdy, NP      . alum & mag hydroxide-simeth (MAALOX/MYLANTA) 200-200-20 MG/5ML suspension 30 mL  30 mL Oral Q4H PRN Thomspon, Geni Bers, NP      . cephALEXin (KEFLEX) capsule 500 mg  500 mg Oral Q6H Thomspon, Jacqueline, NP   500 mg at 09/06/18 1150  . fluvoxaMINE (LUVOX) tablet 150 mg  150 mg Oral QHS Sharma Covert, MD   150 mg at 09/05/18 2124  . hydrOXYzine (ATARAX/VISTARIL) tablet 25 mg  25 mg Oral TID PRN Sharma Covert, MD      . lurasidone (LATUDA) tablet 80 mg  80 mg Oral Q  supper ,  T, MD      . magnesium hydroxide (MILK OF MAGNESIA) suspension 30 mL  30 mL Oral Daily PRN Lamont Dowdy, NP      . nicotine (NICODERM CQ - dosed in mg/24 hours) patch 14 mg  14 mg Transdermal Daily Sharma Covert, MD   14 mg at 09/06/18 2025  . Oxcarbazepine (TRILEPTAL) tablet 600 mg  600 mg Oral BID Lamont Dowdy, NP   600 mg at 09/06/18 0821  . temazepam (RESTORIL) capsule 7.5 mg  7.5 mg Oral QHS PRN Lamont Dowdy, NP        Lab Results: No results found for this or any previous visit (from the past 48 hour(s)).  Blood Alcohol level:  Lab Results  Component Value Date   ETH <10 09/02/2018   ETH <10 42/70/6237    Metabolic Disorder Labs: Lab Results  Component Value Date   HGBA1C 5.3 09/05/2016   MPG 105 09/05/2016   No results found for: PROLACTIN No results found for: CHOL, TRIG, HDL, CHOLHDL, VLDL, LDLCALC  Physical Findings: AIMS: Facial and Oral Movements Muscles of Facial Expression: None, normal Lips and Perioral Area: None, normal Jaw: None, normal Tongue: None, normal,Extremity Movements Upper (arms, wrists, hands, fingers): None, normal Lower (legs, knees, ankles, toes): None, normal, Trunk Movements Neck, shoulders, hips: None, normal, Overall Severity Severity of abnormal movements (highest score from questions above): None, normal Incapacitation due to abnormal movements: None, normal Patient's awareness of abnormal movements (rate only patient's report): No Awareness, Dental Status Current problems with teeth and/or dentures?: No Does patient usually wear dentures?: No  CIWA:  CIWA-Ar Total: 0 COWS:  COWS Total Score: 2  Musculoskeletal: Strength & Muscle Tone: within normal limits Gait & Station: normal Patient leans: N/A  Psychiatric Specialty Exam: Physical Exam  Nursing note and vitals reviewed. Constitutional: He appears well-developed and well-nourished.  HENT:  Head: Normocephalic and atraumatic.   Eyes: Pupils are equal, round, and reactive to light. Conjunctivae are normal.  Neck: Normal range of motion.  Cardiovascular: Regular rhythm and normal heart sounds.  Respiratory: Effort normal.  GI: Soft.  Musculoskeletal: Normal range of motion.  Neurological: He is alert.  Skin: Skin is warm and dry.  Psychiatric: His speech is delayed. He is slowed. He expresses impulsivity. He exhibits a depressed mood. He expresses suicidal ideation. He expresses no suicidal plans. He exhibits abnormal recent memory.    Review of Systems  Constitutional: Negative.   HENT: Negative.  Eyes: Negative.   Respiratory: Negative.   Cardiovascular: Negative.   Gastrointestinal: Negative.   Musculoskeletal: Negative.   Skin: Negative.   Neurological: Negative.   Psychiatric/Behavioral: Positive for depression, hallucinations, memory loss and suicidal ideas. Negative for substance abuse. The patient is nervous/anxious and has insomnia.     Blood pressure 122/80, pulse 70, temperature 98.4 F (36.9 C), temperature source Oral, resp. rate 18, height 6' (1.829 m), weight 86.2 kg, SpO2 97 %.Body mass index is 25.77 kg/m.  General Appearance: Casual  Eye Contact:  Good  Speech:  Clear and Coherent  Volume:  Normal  Mood:  Depressed  Affect:  Depressed  Thought Process:  Coherent  Orientation:  Full (Time, Place, and Person)  Thought Content:  Logical  Suicidal Thoughts:  Yes.  without intent/plan  Homicidal Thoughts:  No  Memory:  Immediate;   Fair Recent;   Poor Remote;   Poor  Judgement:  Impaired  Insight:  Shallow  Psychomotor Activity:  Normal  Concentration:  Concentration: Fair  Recall:  FiservFair  Fund of Knowledge:  Fair  Language:  Fair  Akathisia:  No  Handed:  Right  AIMS (if indicated):     Assets:  Desire for Improvement Housing Physical Health Resilience  ADL's:  Intact  Cognition:  WNL  Sleep:  Number of Hours: 6.5     Treatment Plan Summary: Daily contact with  patient to assess and evaluate symptoms and progress in treatment, Medication management and Plan Patient is very polite and appropriate in his behavior.  Takes care of his ADLs.  Overall does not look like somebody who is obviously psychotic but he is complaining of having these "visions" at night of ways to kill himself.  His affect and mood seem very appropriate during the day and not really connected to how much distress he claims to be in.  Spent some time with him talking about what a stressful home environment he must have.  I suspect this gentleman would be much better off if he were living apart from his family of origin.  In any case right now I will increase his Latuda in hopes that that might make a difference for his mood.  His Vyvanse was not restarted in the hospital not clear that that would be available anyway.  Not sure how much difference it makes to his mood.  Continue group attendance and supportive therapy.  Mordecai RasmussenJohn , MD 09/06/2018, 5:16 PM
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
## 2018-09-06 NOTE — Plan of Care (Signed)
Pt. Reports improved coping. Pt. Denies si/hi/avh, able to contract for safety. Pt. Is complaint with medications.   Problem: Coping: Goal: Coping ability will improve Outcome: Progressing   Problem: Medication: Goal: Compliance with prescribed medication regimen will improve Outcome: Progressing   Problem: Self-Concept: Goal: Ability to disclose and discuss suicidal ideas will improve Outcome: Progressing   Problem: Safety: Goal: Ability to disclose and discuss suicidal ideas will improve Outcome: Progressing

## 2018-09-06 NOTE — Tx Team (Addendum)
Interdisciplinary Treatment and Diagnostic Plan Update  09/06/2018 Time of Session: 230PM Joel Arroyo MRN: [id number]  Principal Diagnosis: <principal problem not specified>  Secondary Diagnoses: Active Problems:   MDD (major depressive disorder), severe (HCC)   Major depression, recurrent (HCC)   Current Medications:  Current Facility-Administered Medications  Medication Dose Route Frequency Provider Last Rate Last Dose  . acetaminophen (TYLENOL) tablet 650 mg  650 mg Oral Q6H PRN Catalina Gravelhomspon, Jacqueline, NP   650 mg at 09/06/18 1420  . albuterol (VENTOLIN HFA) 108 (90 Base) MCG/ACT inhaler 1 puff  1 puff Inhalation Q4H PRN Catalina Gravelhomspon, Jacqueline, NP      . alum & mag hydroxide-simeth (MAALOX/MYLANTA) 200-200-20 MG/5ML suspension 30 mL  30 mL Oral Q4H PRN Thomspon, Adela LankJacqueline, NP      . cephALEXin (KEFLEX) capsule 500 mg  500 mg Oral Q6H Thomspon, Jacqueline, NP   500 mg at 09/06/18 1150  . fluvoxaMINE (LUVOX) tablet 150 mg  150 mg Oral QHS Antonieta Pertlary, Greg Lawson, MD   150 mg at 09/05/18 2124  . hydrOXYzine (ATARAX/VISTARIL) tablet 25 mg  25 mg Oral TID PRN Antonieta Pertlary, Greg Lawson, MD      . lurasidone (LATUDA) tablet 80 mg  80 mg Oral Q supper Clapacs, John T, MD      . magnesium hydroxide (MILK OF MAGNESIA) suspension 30 mL  30 mL Oral Daily PRN Catalina Gravelhomspon, Jacqueline, NP      . nicotine (NICODERM CQ - dosed in mg/24 hours) patch 14 mg  14 mg Transdermal Daily Antonieta Pertlary, Greg Lawson, MD   14 mg at 09/06/18 [id number]  . Oxcarbazepine (TRILEPTAL) tablet 600 mg  600 mg Oral BID Catalina Gravelhomspon, Jacqueline, NP   600 mg at 09/06/18 0821  . temazepam (RESTORIL) capsule 7.5 mg  7.5 mg Oral QHS PRN Catalina Gravelhomspon, Jacqueline, NP       PTA Medications: Medications Prior to Admission  Medication Sig Dispense Refill Last Dose  . albuterol (VENTOLIN HFA) 108 (90 Base) MCG/ACT inhaler Inhale 1 puff into the lungs every 4 (four) hours as needed (SOB). 1 Inhaler 1   . cephALEXin (KEFLEX) 500 MG capsule Take 1 capsule (500 mg  total) by mouth 4 (four) times daily for 10 days. 40 capsule 0   . fluvoxaMINE (LUVOX) 100 MG tablet Take 1 tablet (100 mg total) by mouth at bedtime. 30 tablet 1   . lurasidone (LATUDA) 40 MG TABS tablet Take 1 tablet (40 mg total) by mouth daily with supper. 30 tablet 1   . oxcarbazepine (TRILEPTAL) 600 MG tablet Take 1 tablet (600 mg total) by mouth 2 (two) times daily. 60 tablet 1   . temazepam (RESTORIL) 7.5 MG capsule Take 7.5 mg by mouth at bedtime as needed for sleep.     Marland Kitchen. VYVANSE 70 MG capsule Take 70 mg by mouth daily.        Patient Stressors: Financial difficulties Marital or family conflict Occupational concerns  Patient Strengths: Capable of independent living General fund of knowledge Supportive family/friends  Treatment Modalities: Medication Management, Group therapy, Case management,  1 to 1 session with clinician, Psychoeducation, Recreational therapy.   Physician Treatment Plan for Primary Diagnosis: <principal problem not specified> Long Term Goal(s): Improvement in symptoms so as ready for discharge Improvement in symptoms so as ready for discharge   Short Term Goals: Ability to identify changes in lifestyle to reduce recurrence of condition will improve Ability to verbalize feelings will improve Ability to disclose and discuss suicidal ideas Ability to demonstrate self-control will  improve Ability to identify and develop effective coping behaviors will improve Ability to identify changes in lifestyle to reduce recurrence of condition will improve Ability to verbalize feelings will improve Ability to disclose and discuss suicidal ideas Ability to demonstrate self-control will improve Ability to identify and develop effective coping behaviors will improve Ability to maintain clinical measurements within normal limits will improve  Medication Management: Evaluate patient's response, side effects, and tolerance of medication regimen.  Therapeutic  Interventions: 1 to 1 sessions, Unit Group sessions and Medication administration.  Evaluation of Outcomes: Progressing  Physician Treatment Plan for Secondary Diagnosis: Active Problems:   MDD (major depressive disorder), severe (HCC)   Major depression, recurrent (HCC)  Long Term Goal(s): Improvement in symptoms so as ready for discharge Improvement in symptoms so as ready for discharge   Short Term Goals: Ability to identify changes in lifestyle to reduce recurrence of condition will improve Ability to verbalize feelings will improve Ability to disclose and discuss suicidal ideas Ability to demonstrate self-control will improve Ability to identify and develop effective coping behaviors will improve Ability to identify changes in lifestyle to reduce recurrence of condition will improve Ability to verbalize feelings will improve Ability to disclose and discuss suicidal ideas Ability to demonstrate self-control will improve Ability to identify and develop effective coping behaviors will improve Ability to maintain clinical measurements within normal limits will improve     Medication Management: Evaluate patient's response, side effects, and tolerance of medication regimen.  Therapeutic Interventions: 1 to 1 sessions, Unit Group sessions and Medication administration.  Evaluation of Outcomes: Progressing   RN Treatment Plan for Primary Diagnosis: <principal problem not specified> Long Term Goal(s): Knowledge of disease and therapeutic regimen to maintain health will improve  Short Term Goals: Ability to remain free from injury will improve, Ability to verbalize frustration and anger appropriately will improve, Ability to demonstrate self-control and Ability to disclose and discuss suicidal ideas  Medication Management: RN will administer medications as ordered by provider, will assess and evaluate patient's response and provide education to patient for prescribed medication. RN  will report any adverse and/or side effects to prescribing provider.  Therapeutic Interventions: 1 on 1 counseling sessions, Psychoeducation, Medication administration, Evaluate responses to treatment, Monitor vital signs and CBGs as ordered, Perform/monitor CIWA, COWS, AIMS and Fall Risk screenings as ordered, Perform wound care treatments as ordered.  Evaluation of Outcomes: Progressing   LCSW Treatment Plan for Primary Diagnosis: <principal problem not specified> Long Term Goal(s): Safe transition to appropriate next level of care at discharge, Engage patient in therapeutic group addressing interpersonal concerns.  Short Term Goals: Engage patient in aftercare planning with referrals and resources, Increase emotional regulation, Facilitate acceptance of mental health diagnosis and concerns and Increase skills for wellness and recovery  Therapeutic Interventions: Assess for all discharge needs, 1 to 1 time with Social worker, Explore available resources and support systems, Assess for adequacy in community support network, Educate family and significant other(s) on suicide prevention, Complete Psychosocial Assessment, Interpersonal group therapy.  Evaluation of Outcomes: Progressing   Progress in Treatment: Attending groups: Yes. Participating in groups: Yes. Taking medication as prescribed: Yes. Toleration medication: Yes. Family/Significant other contact made: Yes, individual(s) contacted:  pts step mother Patient understands diagnosis: Yes. Discussing patient identified problems/goals with staff: Yes. Medical problems stabilized or resolved: Yes. Denies suicidal/homicidal ideation: No. Issues/concerns per patient self-inventory: No. Other: N/A  New problem(s) identified: No, Describe:  none  New Short Term/Long Term Goal(s): medication management for mood stabilization; elimination  of SI thoughts; development of comprehensive mental wellness/sobriety plan.   Patient Goals:   "To get the help I need and stop being suicidal"  Discharge Plan or Barriers: SPE pamphlet, Mobile Crisis information, and AA/NA information provided to patient for additional community support and resources. Pt will follow up with [facility] for peer support and therapy services and [facility] for Psychiatry.  Reason for Continuation of Hospitalization: Anxiety Medication stabilization Suicidal ideation  Estimated Length of Stay: 5-7 days Recreational Therapy: Patient Stressors: Family Patient Goal: Patient will identify 3 positive coping skills strategies to use for SI post d/c within 5 recreation therapy group sessions  Attendees: Patient: Joel Arroyo 09/06/2018 2:53 PM  Physician: Dr Weber Cooks MD 09/06/2018 2:53 PM  Nursing: Lyda Kalata RN 09/06/2018 2:53 PM  RN Care Manager: 09/06/2018 2:53 PM  Social Worker: Minette Brine Moton LCSW 09/06/2018 2:53 PM  Recreational Therapist:  09/06/2018 2:53 PM  Other:  09/06/2018 2:53 PM  Other:  09/06/2018 2:53 PM  Other: 09/06/2018 2:53 PM    Scribe for Treatment Team: Mariann Laster Moton, LCSW 09/06/2018 2:53 PM

## 2018-09-06 NOTE — [facility] Suicide Risk Assessment (Signed)
[place] INPATIENT:  Family/Significant Other Suicide Prevention Education  Suicide Prevention Education:  Education Completed; Lajean Silvius, step-mother and legal guardian [phone] has been identified by the patient as the family member/significant other with whom the patient will be residing, and identified as the person(s) who will aid the patient in the event of a mental health crisis (suicidal ideations/suicide attempt).  With written consent from the patient, the family member/significant other has been provided the following suicide prevention education, prior to the and/or following the discharge of the patient.  The suicide prevention education provided includes the following:  Suicide risk factors  Suicide prevention and interventions  National Suicide Hotline telephone number  [facility] assessment telephone number  [facility] Emergency Assistance [place] and/or Residential Mobile Crisis Unit telephone number  Request made of family/significant other to:  Remove weapons (e.g., guns, rifles, knives), all items previously/currently identified as safety concern.    Remove drugs/medications (over-the-counter, prescriptions, illicit drugs), all items previously/currently identified as a safety concern.  The family member/significant other verbalizes understanding of the suicide prevention education information provided.  The family member/significant other agrees to remove the items of safety concern listed above.  Nupur Hohman Mackie reported that pt is in the hospital for making suicidal statements. Per Antonia Jicha Mackie, she got a call from a Education officer, museum when pt was in the ED who reported that pt wanted them to tell her that "he was just kidding and was not suicidal". Per Idania Desouza Mackie, pt needs to understand that this is not a game and that coming to the unit is not a vacation. Aliya Sol Mackie stated that she is willing to let pt return home but if he keeps doing this he will have to be  placed somewhere. Istvan Behar Mackie discussed having DSS come out to her house Friday and unsure of who called. Razi Hickle Mackie denied concerns for HI or with pt returning home at this time. Daiquan Resnik Mackie stated there are no guns/weapons in the home, but she is unsure whether or not pt is suicidal due to not knowing if pt is serious or not when he makes suicidal statements.  [place] MSW LCSW 09/06/2018, 3:02 PM

## 2018-09-07 NOTE — Progress Notes (Signed)
Per Dr. Boykin Reaper consult, Left hand dorsum sutured wound on oral antibiotics.  No pus or redness found.  Would finish antibiotic course and monitor.  No need for any surgical intervention or IV antibiotics. No need to see the patient.  Sign off.

## 2018-09-07 NOTE — Plan of Care (Signed)
  Problem: Education: Goal: Ability to make informed decisions regarding treatment will improve Outcome: Progressing   Problem: Coping: Goal: Coping ability will improve Outcome: Progressing   Problem: Health Behavior/Discharge Planning: Goal: Identification of resources available to assist in meeting health care needs will improve Outcome: Progressing   Problem: Medication: Goal: Compliance with prescribed medication regimen will improve Outcome: Progressing   Problem: Self-Concept: Goal: Ability to disclose and discuss suicidal ideas will improve Outcome: Progressing Goal: Will verbalize positive feelings about self Outcome: Progressing   Problem: Education: Goal: Utilization of techniques to improve thought processes will improve Outcome: Progressing Goal: Knowledge of the prescribed therapeutic regimen will improve Outcome: Progressing   Problem: Activity: Goal: Interest or engagement in leisure activities will improve Outcome: Progressing Goal: Imbalance in normal sleep/wake cycle will improve Outcome: Progressing   Problem: Coping: Goal: Coping ability will improve Outcome: Progressing Goal: Will verbalize feelings Outcome: Progressing   Problem: Health Behavior/Discharge Planning: Goal: Ability to make decisions will improve Outcome: Progressing Goal: Compliance with therapeutic regimen will improve Outcome: Progressing   Problem: Role Relationship: Goal: Will demonstrate positive changes in social behaviors and relationships Outcome: Progressing   Problem: Safety: Goal: Ability to disclose and discuss suicidal ideas will improve Outcome: Progressing Goal: Ability to identify and utilize support systems that promote safety will improve Outcome: Progressing   Problem: Self-Concept: Goal: Will verbalize positive feelings about self Outcome: Progressing Goal: Level of anxiety will decrease Outcome: Progressing   

## 2018-09-07 NOTE — Progress Notes (Signed)
D: Patient stated slept good last night .Stated appetite is good and energy level  Is normal. Stated concentration is good . Stated on Depression scale , hopeless and anxiety .( low 0-10 high) Denies suicidal  homicidal ideations  .  No auditory hallucinations  No pain concerns . Appropriate ADL'S. Interacting with peers and staff. Staff continue to educate patient on  [facility] education , unit programing, unable to verbalize  clear understanding  staff continue to   redirect for better understanding . Emotional and mental status improved  voice no concerns around sleep. Able to vent frustration and anger . Limited participation with unit programing   Denies suicidal ideations  Working on Radiographer, therapeutic ,  anxiety and decision making . Patient able to understand medications  received   A: Encourage patient participation with unit programming . Instruction  Given on  Medication , verbalize understanding.  R: Voice no other concerns. Staff continue to monitor

## 2018-09-07 NOTE — [facility] Group Notes (Signed)
Feelings Around Diagnosis 09/07/2018 1PM  Type of Therapy/Topic:  Group Therapy:  Feelings about Diagnosis  Participation Level:  Active   Description of Group:   This group will allow patients to explore their thoughts and feelings about diagnoses they have received. Patients will be guided to explore their level of understanding and acceptance of these diagnoses. Facilitator will encourage patients to process their thoughts and feelings about the reactions of others to their diagnosis and will guide patients in identifying ways to discuss their diagnosis with significant others in their lives. This group will be process-oriented, with patients participating in exploration of their own experiences, giving and receiving support, and processing challenge from other group members.   Therapeutic Goals: 1. Patient will demonstrate understanding of diagnosis as evidenced by identifying two or more symptoms of the disorder 2. Patient will be able to express two feelings regarding the diagnosis 3. Patient will demonstrate their ability to communicate their needs through discussion and/or role play  Summary of Patient Progress:  Actively and appropriately engaged in the group. Patient was able to provide support and validation to other group members.Patient practiced active listening when interacting with the facilitator and other group members.    Therapeutic Modalities:   Cognitive Behavioral Therapy Brief Therapy Feelings Identification    Yvette Rack, LCSW 09/07/2018 2:12 PM

## 2018-09-07 NOTE — Progress Notes (Signed)
D: Patient stated slept fair last night .Stated appetite fair and energy level   normal. Stated concentration  good . Stated on Depression scale 7 , hopeless 8 and anxiety 0.( low 0-10 high) Denies suicidal  homicidal ideations  .  No auditory hallucinations  No pain concerns . Appropriate ADL'S.  Staff continue to educate patient on [facility] education , unit programing, unable to verbalize clear understanding staff continue to redirect for better understanding . Emotional and mental status improved voice no concerns around sleep.Able to vent frustration and anger . Limited participation with unit programing ,patient can verbalize feeling of self , Voice no concerns around safety or nutrition. Passive  Thoughts  Of  suicidal ideations. Patient  States he has visions  Of suicidal   On how to do it . Affect cheerful  laughing aloud  And interacting with peers.Working on Radiographer, therapeutic and decision making   A: Encourage patient participation with unit programming . Instruction  Given on  Medication , verbalize understanding.  R: Voice no other concerns. Staff continue to monitor

## 2018-09-07 NOTE — Progress Notes (Signed)
Recreation Therapy Notes  Date: 09/07/2018  Time: 9:30 am  Location: Craft room  Behavioral response: Appropriate,Hyperactive  Intervention Topic: Coping Skills  Discussion/Intervention:  Group content on today was focused on coping skills. The group defined what coping skills are and when they can be used. Individuals described how they normally cope with thing and the coping skills they normally use. Patients expressed why it is important to cope with things and how not coping with things can affect you. The group participated in the intervention "My coping box" and made coping boxes while adding coping skills they could use in the future to the box. Clinical Observations/Feedback:  Patient came to group and defined coping skills as figuring out ways to deal with things.He identified playing music and dancing as coping skills he uses. Participant explained that coping skills prevent him from getting aggitated. Individual was social with peers and staff while participating in the intervention. Venkat Ankney LRT/CTRS         Nanna Ertle 09/07/2018 11:22 AM

## 2018-09-07 NOTE — Plan of Care (Signed)
Staff continue to educate patient on  [facility] education , unit programing, unable to verbalize  clear understanding  staff continue to   redirect for better understanding . Emotional and mental status improved  voice no concerns around sleep. Able to vent frustration and anger . Limited participation with unit programing   Denies suicidal ideations  Working on Pharmacologistcoping skills ,  anxiety and decision making . Patient able to understand medications  received   Problem: Education: Goal: Ability to make informed decisions regarding treatment will improve 09/07/2018 1450 by Crist InfanteFarrish, Kayani Rapaport A, RN Outcome: Progressing 09/07/2018 1436 by Crist InfanteFarrish, Jemiah Cuadra A, RN Outcome: Progressing   Problem: Coping: Goal: Coping ability will improve 09/07/2018 1450 by Crist InfanteFarrish, Clatie Kessen A, RN Outcome: Progressing 09/07/2018 1436 by Crist InfanteFarrish, Avamae Dehaan A, RN Outcome: Progressing   Problem: Health Behavior/Discharge Planning: Goal: Identification of resources available to assist in meeting health care needs will improve 09/07/2018 1450 by Crist InfanteFarrish, Reeta Kuk A, RN Outcome: Progressing 09/07/2018 1436 by Crist InfanteFarrish, Brailynn Breth A, RN Outcome: Progressing   Problem: Medication: Goal: Compliance with prescribed medication regimen will improve 09/07/2018 1450 by Crist InfanteFarrish, Alisse Tuite A, RN Outcome: Progressing 09/07/2018 1436 by Crist InfanteFarrish, Lakin Romer A, RN Outcome: Progressing   Problem: Self-Concept: Goal: Ability to disclose and discuss suicidal ideas will improve 09/07/2018 1450 by Crist InfanteFarrish, Jyra Lagares A, RN Outcome: Progressing 09/07/2018 1436 by Crist InfanteFarrish, Lillybeth Tal A, RN Outcome: Progressing Goal: Will verbalize positive feelings about self 09/07/2018 1450 by Crist InfanteFarrish, Aliannah Holstrom A, RN Outcome: Progressing 09/07/2018 1436 by Crist InfanteFarrish, Bergen Magner A, RN Outcome: Progressing   Problem: Education: Goal: Utilization of techniques to improve thought processes will improve 09/07/2018 1450 by Crist InfanteFarrish, Janeliz Prestwood A, RN Outcome: Progressing 09/07/2018 1436 by Crist InfanteFarrish, Abdulahi Schor A, RN Outcome:  Progressing Goal: Knowledge of the prescribed therapeutic regimen will improve 09/07/2018 1450 by Crist InfanteFarrish, Lasonya Hubner A, RN Outcome: Progressing 09/07/2018 1436 by Crist InfanteFarrish, Apryll Hinkle A, RN Outcome: Progressing   Problem: Activity: Goal: Interest or engagement in leisure activities will improve 09/07/2018 1450 by Crist InfanteFarrish, Jarrett Chicoine A, RN Outcome: Progressing 09/07/2018 1436 by Crist InfanteFarrish, Delisia Mcquiston A, RN Outcome: Progressing Goal: Imbalance in normal sleep/wake cycle will improve 09/07/2018 1450 by Crist InfanteFarrish, Rocko Fesperman A, RN Outcome: Progressing 09/07/2018 1436 by Crist InfanteFarrish, Jacqulynn Shappell A, RN Outcome: Progressing   Problem: Coping: Goal: Coping ability will improve 09/07/2018 1450 by Crist InfanteFarrish, Deneisha Dade A, RN Outcome: Progressing 09/07/2018 1436 by Crist InfanteFarrish, Jeston Junkins A, RN Outcome: Progressing Goal: Will verbalize feelings 09/07/2018 1450 by Crist InfanteFarrish, Puneet Masoner A, RN Outcome: Progressing 09/07/2018 1436 by Crist InfanteFarrish, Christobal Morado A, RN Outcome: Progressing   Problem: Health Behavior/Discharge Planning: Goal: Ability to make decisions will improve 09/07/2018 1450 by Crist InfanteFarrish, Patrecia Veiga A, RN Outcome: Progressing 09/07/2018 1436 by Crist InfanteFarrish, Darrel Gloss A, RN Outcome: Progressing Goal: Compliance with therapeutic regimen will improve 09/07/2018 1450 by Crist InfanteFarrish, Solara Goodchild A, RN Outcome: Progressing 09/07/2018 1436 by Crist InfanteFarrish, Misheel Gowans A, RN Outcome: Progressing   Problem: Role Relationship: Goal: Will demonstrate positive changes in social behaviors and relationships 09/07/2018 1450 by Crist InfanteFarrish, Deniro Laymon A, RN Outcome: Progressing 09/07/2018 1436 by Crist InfanteFarrish, Tranae Laramie A, RN Outcome: Progressing   Problem: Safety: Goal: Ability to disclose and discuss suicidal ideas will improve 09/07/2018 1450 by Crist InfanteFarrish, Mayukha Symmonds A, RN Outcome: Progressing 09/07/2018 1436 by Crist InfanteFarrish, Ruthel Martine A, RN Outcome: Progressing Goal: Ability to identify and utilize support systems that promote safety will improve 09/07/2018 1450 by Crist InfanteFarrish, Ardice Boyan A, RN Outcome: Progressing 09/07/2018 1436 by Crist InfanteFarrish, Dianna Deshler A,  RN Outcome: Progressing   Problem: Self-Concept: Goal: Will verbalize positive feelings about self 09/07/2018 1450 by Crist InfanteFarrish, Shatika Grinnell A, RN Outcome:  Progressing 09/07/2018 1436 by Leodis Liverpool, RN Outcome: Progressing Goal: Level of anxiety will decrease 09/07/2018 1450 by Leodis Liverpool, RN Outcome: Progressing 09/07/2018 1436 by Leodis Liverpool, RN Outcome: Progressing

## 2018-09-07 NOTE — Progress Notes (Signed)
[facility] MD Progress Note  09/07/2018 5:36 PM Joel Arroyo  MRN:  [id number] Subjective: Patient seen.  He reports that he is doing better.  He says he still has "visions" but that they are "less strong".  Patient is active throughout the entire day.  Goes to all groups.  Interacts with others.  At no time seems to be distracted by internal stimuli.  Affect is bright and upbeat.  Patient clearly feels more comfortable here.  We talked about his stressful home life.  Patient is tolerating medicine without any complaints currently.  Sleeping okay.  Eating okay Principal Problem: MDD (major depressive disorder), severe ([place]) Diagnosis: Principal Problem:   MDD (major depressive disorder), severe ([facility]) Active Problems:   ADHD (attention deficit hyperactivity disorder)   Suicidal ideation   Major depression, recurrent ([place])  Total Time spent with patient: 30 minutes  Past Psychiatric History: Past history of depression bipolar disorder mood instability  Past Medical History:  Past Medical History:  Diagnosis Date  . Anxiety   . Asthma   . Bipolar 1 disorder ([place])   . Depression   . Seizures ([place])     Past Surgical History:  Procedure Laterality Date  . HERNIA REPAIR     Family History:  Family History  Problem Relation Age of Onset  . Kidney cancer Neg Hx   . Kidney disease Neg Hx   . Prostate cancer Neg Hx    Family Psychiatric  History: See previous Social History:  Social History   Substance and Sexual Activity  Alcohol Use No     Social History   Substance and Sexual Activity  Drug Use No    Social History   Socioeconomic History  . Marital status: Single    Spouse name: Not on file  . Number of children: Not on file  . Years of education: Not on file  . Highest education level: Not on file  Occupational History  . Not on file  Social Needs  . Financial resource strain: Not on file  . Food insecurity    Worry: Not on file    Inability: Not on file  .  Transportation needs    Medical: Not on file    Non-medical: Not on file  Tobacco Use  . Smoking status: Current Every Day Smoker    Packs/day: 0.50    Types: Cigarettes  . Smokeless tobacco: Never Used  Substance and Sexual Activity  . Alcohol use: No  . Drug use: No  . Sexual activity: Not on file  Lifestyle  . Physical activity    Days per week: Not on file    Minutes per session: Not on file  . Stress: Not on file  Relationships  . Social Herbalist on phone: Not on file    Gets together: Not on file    Attends religious service: Not on file    Active member of club or organization: Not on file    Attends meetings of clubs or organizations: Not on file    Relationship status: Not on file  Other Topics Concern  . Not on file  Social History Narrative  . Not on file   Additional Social History:                         Sleep: Fair  Appetite:  Fair  Current Medications: Current Facility-Administered Medications  Medication Dose Route Frequency Provider Last Rate Last Dose  .  acetaminophen (TYLENOL) tablet 650 mg  650 mg Oral Q6H PRN Catalina Gravelhomspon, Jacqueline, NP   650 mg at 09/06/18 1420  . albuterol (VENTOLIN HFA) 108 (90 Base) MCG/ACT inhaler 1 puff  1 puff Inhalation Q4H PRN Catalina Gravelhomspon, Jacqueline, NP      . alum & mag hydroxide-simeth (MAALOX/MYLANTA) 200-200-20 MG/5ML suspension 30 mL  30 mL Oral Q4H PRN Thomspon, Adela LankJacqueline, NP      . cephALEXin (KEFLEX) capsule 500 mg  500 mg Oral Q6H Thomspon, Adela LankJacqueline, NP   500 mg at 09/07/18 1720  . fluvoxaMINE (LUVOX) tablet 150 mg  150 mg Oral QHS Antonieta Pertlary, Greg Lawson, MD   150 mg at 09/06/18 2135  . hydrOXYzine (ATARAX/VISTARIL) tablet 25 mg  25 mg Oral TID PRN Antonieta Pertlary, Greg Lawson, MD      . lurasidone (LATUDA) tablet 80 mg  80 mg Oral Q supper Ileene Allie, Jackquline DenmarkJohn T, MD   80 mg at 09/07/18 1719  . magnesium hydroxide (MILK OF MAGNESIA) suspension 30 mL  30 mL Oral Daily PRN Thomspon, Adela LankJacqueline, NP      . nicotine  (NICODERM CQ - dosed in mg/24 hours) patch 14 mg  14 mg Transdermal Daily Antonieta Pertlary, Greg Lawson, MD   14 mg at 09/07/18 0854  . Oxcarbazepine (TRILEPTAL) tablet 600 mg  600 mg Oral BID Catalina Gravelhomspon, Jacqueline, NP   600 mg at 09/07/18 1719  . temazepam (RESTORIL) capsule 7.5 mg  7.5 mg Oral QHS PRN Catalina Gravelhomspon, Jacqueline, NP        Lab Results: No results found for this or any previous visit (from the past 48 hour(s)).  Blood Alcohol level:  Lab Results  Component Value Date   ETH <10 09/02/2018   ETH <10 08/17/2018    Metabolic Disorder Labs: Lab Results  Component Value Date   HGBA1C 5.3 09/05/2016   MPG 105 09/05/2016   No results found for: PROLACTIN No results found for: CHOL, TRIG, HDL, CHOLHDL, VLDL, LDLCALC  Physical Findings: AIMS: Facial and Oral Movements Muscles of Facial Expression: None, normal Lips and Perioral Area: None, normal Jaw: None, normal Tongue: None, normal,Extremity Movements Upper (arms, wrists, hands, fingers): None, normal Lower (legs, knees, ankles, toes): None, normal, Trunk Movements Neck, shoulders, hips: None, normal, Overall Severity Severity of abnormal movements (highest score from questions above): None, normal Incapacitation due to abnormal movements: None, normal Patient's awareness of abnormal movements (rate only patient's report): No Awareness, Dental Status Current problems with teeth and/or dentures?: No Does patient usually wear dentures?: No  CIWA:  CIWA-Ar Total: 0 COWS:  COWS Total Score: 2  Musculoskeletal: Strength & Muscle Tone: within normal limits Gait & Station: normal Patient leans: N/A  Psychiatric Specialty Exam: Physical Exam  Nursing note and vitals reviewed. Constitutional: He appears well-developed and well-nourished.  HENT:  Head: Normocephalic and atraumatic.  Eyes: Pupils are equal, round, and reactive to light. Conjunctivae are normal.  Neck: Normal range of motion.  Cardiovascular: Regular rhythm and  normal heart sounds.  Respiratory: Effort normal.  GI: Soft.  Musculoskeletal: Normal range of motion.  Neurological: He is alert.  Skin: Skin is warm and dry.  Psychiatric: Judgment normal. His speech is delayed. He is slowed. Cognition and memory are normal. He exhibits a depressed mood. He expresses suicidal ideation. He expresses no suicidal plans.    Review of Systems  Constitutional: Negative.   HENT: Negative.   Eyes: Negative.   Respiratory: Negative.   Cardiovascular: Negative.   Gastrointestinal: Negative.   Musculoskeletal: Negative.  Skin: Negative.   Neurological: Negative.   Psychiatric/Behavioral: Positive for depression and suicidal ideas.    Blood pressure 125/79, pulse 73, temperature 97.7 F (36.5 C), temperature source Oral, resp. rate 17, height 6' (1.829 m), weight 86.2 kg, SpO2 97 %.Body mass index is 25.77 kg/m.  General Appearance: Casual  Eye Contact:  Good  Speech:  Normal Rate  Volume:  Normal  Mood:  Euthymic  Affect:  Congruent  Thought Process:  Goal Directed  Orientation:  Full (Time, Place, and Person)  Thought Content:  Logical  Suicidal Thoughts:  No  Homicidal Thoughts:  No  Memory:  Immediate;   Fair Recent;   Fair Remote;   Fair  Judgement:  Fair  Insight:  Fair  Psychomotor Activity:  Decreased  Concentration:  Concentration: Fair  Recall:  FiservFair  Fund of Knowledge:  Fair  Language:  Fair  Akathisia:  No  Handed:  Right  AIMS (if indicated):     Assets:  Desire for Improvement Housing Physical Health  ADL's:  Intact  Cognition:  WNL  Sleep:  Number of Hours: 6.5     Treatment Plan Summary: Daily contact with patient to assess and evaluate symptoms and progress in treatment, Medication management and Plan Continues to endorse having suicidal visions.  Patient appears to enjoy hospital stay.  Probably get some benefit out of that but may be slightly exaggerating his dangerousness.  No change to medicine for today.  Patient  is on an adequate dose of antipsychotic.  Plan for possible discharge within 1 to 2 days.  Urged patient to think seriously about getting a new living arrangement in the future.  Mordecai RasmussenJohn Asheton Scheffler, MD 09/07/2018, 5:36 PM

## 2018-09-07 NOTE — Progress Notes (Signed)
Patient is alert and oriented x 4 , stable and conduct himself nicely in the unit maintaining safety of self and others, denies any suicidal ,homicidal ideations and no signs of delusions and hallucinations, patient is attending groups and socializing adequate with peers and interact assertively. Play scheduled games and watch televisions with peers, patient is complying with his medications and no side effects, patient expressed no further complains, appetite is good , mood is appropriate and well hydrated with fluid and juices, only requiring 15 minutes safety checks for safety.

## 2018-09-07 NOTE — Plan of Care (Signed)
Staff continue to educate patient on  [facility] education , unit programing, unable to verbalize  clear understanding  staff continue to   redirect for better understanding . Emotional and mental status improved  voice no concerns around sleep. Able to vent frustration and anger . Limited participation with unit programing  ,patient can verbalize feeling of self , Voice no concerns around safety  or nutrition.  Denies suicidal ideations  Working on Radiographer, therapeutic  and decision making    Problem: Education: Goal: Ability to make informed decisions regarding treatment will improve Outcome: Progressing   Problem: Coping: Goal: Coping ability will improve Outcome: Progressing   Problem: Health Behavior/Discharge Planning: Goal: Identification of resources available to assist in meeting health care needs will improve Outcome: Progressing   Problem: Medication: Goal: Compliance with prescribed medication regimen will improve Outcome: Progressing   Problem: Self-Concept: Goal: Ability to disclose and discuss suicidal ideas will improve Outcome: Progressing Goal: Will verbalize positive feelings about self Outcome: Progressing   Problem: Education: Goal: Utilization of techniques to improve thought processes will improve Outcome: Progressing Goal: Knowledge of the prescribed therapeutic regimen will improve Outcome: Progressing   Problem: Activity: Goal: Interest or engagement in leisure activities will improve Outcome: Progressing Goal: Imbalance in normal sleep/wake cycle will improve Outcome: Progressing   Problem: Coping: Goal: Coping ability will improve Outcome: Progressing Goal: Will verbalize feelings Outcome: Progressing   Problem: Health Behavior/Discharge Planning: Goal: Ability to make decisions will improve Outcome: Progressing Goal: Compliance with therapeutic regimen will improve Outcome: Progressing   Problem: Role Relationship: Goal: Will demonstrate  positive changes in social behaviors and relationships Outcome: Progressing   Problem: Safety: Goal: Ability to disclose and discuss suicidal ideas will improve Outcome: Progressing Goal: Ability to identify and utilize support systems that promote safety will improve Outcome: Progressing   Problem: Self-Concept: Goal: Will verbalize positive feelings about self Outcome: Progressing Goal: Level of anxiety will decrease Outcome: Progressing

## 2018-09-08 NOTE — Progress Notes (Signed)
D - Patient was in his room upon arrival to the unit. Patient was pleasant during assessment and medication administration. Patient denies /HI/AVH, and pain. Patient endorses SI but contracts for safety with this Probation officer. Patient rated his depression and anxiety 5/10. Patient said he had good day but doesn't think he is ready to go.   A - Patient compliant with medication administration per MD orders and procedures on the unit. Patient given education. Patient given support and education to be active in his treatment plan. Patient informed to let staff know if there are any issues or problems on the unit.   R - Patient being monitored Q 15 minutes for safety per unit protocol. Patient remains safe on the unit.

## 2018-09-08 NOTE — Progress Notes (Signed)
[facility]  09/08/2018 5:55 PM Joel Arroyo  MRN:  [id number] Subjective: Patient seen and chart reviewed patient says he is feeling good today.  He says he is not having any suicidal "visions" today.  His affect is upbeat.  He has no physical complaints.  Feels comfortable going home tomorrow Principal Problem: MDD (major depressive disorder), severe (HCC) Diagnosis: Principal Problem:   MDD (major depressive disorder), severe (HCC) Active Problems:   ADHD (attention deficit hyperactivity disorder)   Suicidal ideation   Major depression, recurrent (HCC)  Total Time spent with patient: 30 minutes  Past Psychiatric History: Past history of recurrent depression and behavior problems  Past Medical History:  Past Medical History:  Diagnosis Date  . Anxiety   . Asthma   . Bipolar 1 disorder (HCC)   . Depression   . Seizures (HCC)     Past Surgical History:  Procedure Laterality Date  . HERNIA REPAIR     Family History:  Family History  Problem Relation Age of Onset  . Kidney cancer Neg Hx   . Kidney disease Neg Hx   . Prostate cancer Neg Hx    Family Psychiatric  History: See previous Social History:  Social History   Substance and Sexual Activity  Alcohol Use No     Social History   Substance and Sexual Activity  Drug Use No    Social History   Socioeconomic History  . Marital status: Single    Spouse name: Not on file  . Number of children: Not on file  . Years of education: Not on file  . Highest education level: Not on file  Occupational History  . Not on file  Social Needs  . Financial resource strain: Not on file  . Food insecurity    Worry: Not on file    Inability: Not on file  . Transportation needs    Medical: Not on file    Non-medical: Not on file  Tobacco Use  . Smoking status: Current Every Day Smoker    Packs/day: 0.50    Types: Cigarettes  . Smokeless tobacco: Never Used  Substance and Sexual Activity  . Alcohol use: No   . Drug use: No  . Sexual activity: Not on file  Lifestyle  . Physical activity    Days per week: Not on file    Minutes per session: Not on file  . Stress: Not on file  Relationships  . Social Musicianconnections    Talks on phone: Not on file    Gets together: Not on file    Attends religious service: Not on file    Active member of club or organization: Not on file    Attends meetings of clubs or organizations: Not on file    Relationship status: Not on file  Other Topics Concern  . Not on file  Social History Narrative  . Not on file   Additional Social History:                         Sleep: Fair  Appetite:  Fair  Current Medications: Current Facility-Administered Medications  Medication Dose Route Frequency Provider Last Rate Last Dose  . acetaminophen (TYLENOL) tablet 650 mg  650 mg Oral Q6H PRN Catalina Gravelhomspon, Jacqueline, NP   650 mg at 09/08/18 0619  . albuterol (VENTOLIN HFA) 108 (90 Base) MCG/ACT inhaler 1 puff  1 puff Inhalation Q4H PRN Catalina Gravelhomspon, Jacqueline, NP      .  alum & mag hydroxide-simeth (MAALOX/MYLANTA) 200-200-20 MG/5ML suspension 30 mL  30 mL Oral Q4H PRN Thomspon, Adela LankJacqueline, NP      . cephALEXin (KEFLEX) capsule 500 mg  500 mg Oral Q6H Thomspon, Jacqueline, NP   500 mg at 09/08/18 1410  . fluvoxaMINE (LUVOX) tablet 150 mg  150 mg Oral QHS Antonieta Pertlary, Greg Lawson, MD   150 mg at 09/07/18 2214  . hydrOXYzine (ATARAX/VISTARIL) tablet 25 mg  25 mg Oral TID PRN Antonieta Pertlary, Greg Lawson, MD      . lurasidone (LATUDA) tablet 80 mg  80 mg Oral Q supper Chivon Lepage, Jackquline DenmarkJohn T, MD   80 mg at 09/08/18 1731  . magnesium hydroxide (MILK OF MAGNESIA) suspension 30 mL  30 mL Oral Daily PRN Catalina Gravelhomspon, Jacqueline, NP      . nicotine (NICODERM CQ - dosed in mg/24 hours) patch 14 mg  14 mg Transdermal Daily Antonieta Pertlary, Greg Lawson, MD   14 mg at 09/08/18 0818  . Oxcarbazepine (TRILEPTAL) tablet 600 mg  600 mg Oral BID Catalina Gravelhomspon, Jacqueline, NP   600 mg at 09/08/18 1730  . temazepam (RESTORIL)  capsule 7.5 mg  7.5 mg Oral QHS PRN Catalina Gravelhomspon, Jacqueline, NP        Lab Results: No results found for this or any previous visit (from the past 48 hour(s)).  Blood Alcohol level:  Lab Results  Component Value Date   ETH <10 09/02/2018   ETH <10 08/17/2018    Metabolic Disorder Labs: Lab Results  Component Value Date   HGBA1C 5.3 09/05/2016   MPG 105 09/05/2016   No results found for: PROLACTIN No results found for: CHOL, TRIG, HDL, CHOLHDL, VLDL, LDLCALC  Physical Findings: AIMS: Facial and Oral Movements Muscles of Facial Expression: None, normal Lips and Perioral Area: None, normal Jaw: None, normal Tongue: None, normal,Extremity Movements Upper (arms, wrists, hands, fingers): None, normal Lower (legs, knees, ankles, toes): None, normal, Trunk Movements Neck, shoulders, hips: None, normal, Overall Severity Severity of abnormal movements (highest score from questions above): None, normal Incapacitation due to abnormal movements: None, normal Patient's awareness of abnormal movements (rate only patient's report): No Awareness, Dental Status Current problems with teeth and/or dentures?: No Does patient usually wear dentures?: No  CIWA:  CIWA-Ar Total: 0 COWS:  COWS Total Score: 2  Musculoskeletal: Strength & Muscle Tone: within normal limits Gait & Station: normal Patient leans: N/A  Psychiatric Specialty Exam: Physical Exam  Nursing note and vitals reviewed. Constitutional: He appears well-developed and well-nourished.  HENT:  Head: Normocephalic and atraumatic.  Eyes: Pupils are equal, round, and reactive to light. Conjunctivae are normal.  Neck: Normal range of motion.  Cardiovascular: Normal heart sounds.  Respiratory: Effort normal.  GI: Soft.  Musculoskeletal: Normal range of motion.  Neurological: He is alert.  Skin: Skin is warm and dry.  Psychiatric: He has a normal mood and affect. His behavior is normal. Judgment and thought content normal.     Review of Systems  Constitutional: Negative.   HENT: Negative.   Eyes: Negative.   Respiratory: Negative.   Cardiovascular: Negative.   Gastrointestinal: Negative.   Musculoskeletal: Negative.   Skin: Negative.   Neurological: Negative.   Psychiatric/Behavioral: Negative.     Blood pressure 121/79, pulse 66, temperature 98.3 F (36.8 C), temperature source Oral, resp. rate 16, height 6' (1.829 m), weight 86.2 kg, SpO2 97 %.Body mass index is 25.77 kg/m.  General Appearance: Casual  Eye Contact:  Fair  Speech:  Clear and Coherent  Volume:  Normal  Mood:  Euthymic  Affect:  Congruent  Thought Process:  Goal Directed  Orientation:  Full (Time, Place, and Person)  Thought Content:  Logical  Suicidal Thoughts:  No  Homicidal Thoughts:  No  Memory:  Immediate;   Fair Recent;   Fair Remote;   Fair  Judgement:  Fair  Insight:  Fair  Psychomotor Activity:  Increased  Concentration:  Concentration: Fair  Recall:  [employer] of Knowledge:  Fair  Language:  Fair  Akathisia:  No  Handed:  Right  AIMS (if indicated):     Assets:  Desire for Improvement Housing  ADL's:  Intact  Cognition:  WNL  Sleep:  Number of Hours: 6     Treatment Plan Summary: Daily contact with patient to assess and evaluate symptoms and progress in treatment, Medication management and Plan Patient will be discharged tomorrow.  Supportive therapy and review of medicine.  Reviewed with patient the need for him to work on his coping skills to stay out of the hospital.  Alethia Berthold, MD 09/08/2018, 5:55 PM

## 2018-09-08 NOTE — Plan of Care (Signed)
Patient able to understand medications  received Staff continue to educate patient on  [facility] education , unit programing, unable to verbalize  clear understanding  staff continue to   redirect for better understanding . Emotional and mental status improved  voice no concerns around sleep. Able to vent frustration and anger . Limited participation with unit programing   Denies suicidal ideations  Working on Radiographer, therapeutic ,  anxiety and decision making .  Problem: Education: Goal: Ability to make informed decisions regarding treatment will improve Outcome: Progressing   Problem: Coping: Goal: Coping ability will improve Outcome: Progressing   Problem: Health Behavior/Discharge Planning: Goal: Identification of resources available to assist in meeting health care needs will improve Outcome: Progressing   Problem: Medication: Goal: Compliance with prescribed medication regimen will improve Outcome: Progressing   Problem: Self-Concept: Goal: Ability to disclose and discuss suicidal ideas will improve Outcome: Progressing Goal: Will verbalize positive feelings about self Outcome: Progressing   Problem: Education: Goal: Utilization of techniques to improve thought processes will improve Outcome: Progressing Goal: Knowledge of the prescribed therapeutic regimen will improve Outcome: Progressing   Problem: Activity: Goal: Interest or engagement in leisure activities will improve Outcome: Progressing Goal: Imbalance in normal sleep/wake cycle will improve Outcome: Progressing   Problem: Coping: Goal: Coping ability will improve Outcome: Progressing Goal: Will verbalize feelings Outcome: Progressing   Problem: Health Behavior/Discharge Planning: Goal: Ability to make decisions will improve Outcome: Progressing Goal: Compliance with therapeutic regimen will improve Outcome: Progressing   Problem: Role Relationship: Goal: Will demonstrate positive changes in social  behaviors and relationships Outcome: Progressing   Problem: Safety: Goal: Ability to disclose and discuss suicidal ideas will improve Outcome: Progressing Goal: Ability to identify and utilize support systems that promote safety will improve Outcome: Progressing   Problem: Self-Concept: Goal: Will verbalize positive feelings about self Outcome: Progressing Goal: Level of anxiety will decrease Outcome: Progressing

## 2018-09-08 NOTE — Progress Notes (Signed)
Recreation Therapy Notes   Date: 09/08/2018  Time: 9:30 am  Location: Craft room  Behavioral response: Appropriate  Intervention Topic: Anger Management  Discussion/Intervention:  Group content on today was focused on anger management. The group defined anger and reasons they become angry. Individuals expressed negative way they have dealt with anger in the past. Patients stated some positive ways they could deal with anger in the future. The group described how anger can affect your health and daily plans. Individuals participated in the intervention "Score your anger" where they had a chance to answer questions about themselves and get a score of their anger.  Clinical Observations/Feedback:  Patient came to group and defined anger management as going to anger management classes. He explained that fighting is a way he negatively expresses his anger. Participant described that listening to music is something he does to calm down when he is angry. Individual was social with peers and staff while participating in the intervention. Anamae Rochelle LRT/CTRS         Leyda Vanderwerf 09/08/2018 10:50 AM

## 2018-09-08 NOTE — Progress Notes (Signed)
D: Patient able to understand medications  received Staff continue to educate patient on  [facility] education , unit programing, unable to verbalize  clear understanding  staff continue to   redirect for better understanding . Emotional and mental status improved  voice no concerns around sleep. Able to vent frustration and anger . Limited participation with unit programing     Working on coping skills ,  anxiety and decision making  Patient stated slept  fair last night .Stated appetite fair and energy level  Is normal. Stated concentration is good . Stated on Depression scale 2 , hopeless 2 and anxiety 0 .( low 0-10 high) Denies suicidal  homicidal ideations  .  No auditory hallucinations  No pain concerns . Appropriate ADL'S. Interacting with peers and staff.   Patient very euphoric and animated this shift laughing , jumping to celing  Of unit  Joking around with peers  .  Continue to  Voice of being suicidal   Response and actions on unit   Incongruent with statement .    A: Encourage patient participation with unit programming . Instruction  Given on  Medication , verbalize understanding.  R: Voice no other concerns. Staff continue to monitor

## 2018-09-08 NOTE — [facility] Group Notes (Signed)
LCSW Group Therapy Note  09/08/2018 1:00 PM  Type of Therapy/Topic:  Group Therapy:  Emotion Regulation  Participation Level:  Active   Description of Group:   The purpose of this group is to assist patients in learning to regulate negative emotions and experience positive emotions. Patients will be guided to discuss ways in which they have been vulnerable to their negative emotions. These vulnerabilities will be juxtaposed with experiences of positive emotions or situations, and patients will be challenged to use positive emotions to combat negative ones. Special emphasis will be placed on coping with negative emotions in conflict situations, and patients will process healthy conflict resolution skills.  Therapeutic Goals: 1. Patient will identify two positive emotions or experiences to reflect on in order to balance out negative emotions 2. Patient will label two or more emotions that they find the most difficult to experience 3. Patient will demonstrate positive conflict resolution skills through discussion and/or role plays  Summary of Patient Progress: Patient was present in group. Patient discussed what emotion regulation meant to him.  Patient shared the emotions that he struggles with regulating as well as the coping skills that he utilizes.    Therapeutic Modalities:   Cognitive Behavioral Therapy Feelings Identification Dialectical Behavioral Therapy  Assunta Curtis, MSW, LCSW 09/08/2018 12:53 PM

## 2018-09-08 NOTE — Plan of Care (Signed)
Patient compliant with medication administration per MD orders. Patient says he still has thoughts of suicide but doesn't want to act on them. Patient contracts for safety.   Problem: Medication: Goal: Compliance with prescribed medication regimen will improve Outcome: Progressing   Problem: Self-Concept: Goal: Ability to disclose and discuss suicidal ideas will improve Outcome: Not Progressing

## 2018-09-08 NOTE — [facility] Suicide Risk Assessment (Signed)
[facility] Discharge Suicide Risk Assessment   Principal Problem: MDD (major depressive disorder), severe ([place]) Discharge Diagnoses: Principal Problem:   MDD (major depressive disorder), severe ([place]) Active Problems:   ADHD (attention deficit hyperactivity disorder)   Suicidal ideation   Major depression, recurrent ([place])   Total Time spent with patient: 45 minutes  Musculoskeletal: Strength & Muscle Tone: within normal limits Gait & Station: normal Patient leans: N/A  Psychiatric Specialty Exam: Review of Systems  Constitutional: Negative.   HENT: Negative.   Eyes: Negative.   Respiratory: Negative.   Cardiovascular: Negative.   Gastrointestinal: Negative.   Musculoskeletal: Negative.   Skin: Negative.   Neurological: Negative.   Psychiatric/Behavioral: Negative.     Blood pressure 121/79, pulse 66, temperature 98.3 F (36.8 C), temperature source Oral, resp. rate 16, height 6' (1.829 m), weight 86.2 kg, SpO2 97 %.Body mass index is 25.77 kg/m.  General Appearance: Casual  Eye Contact::  Good  Speech:  Garbled409  Volume:  Normal  Mood:  Euthymic  Affect:  Congruent  Thought Process:  Goal Directed  Orientation:  Full (Time, Place, and Person)  Thought Content:  Logical  Suicidal Thoughts:  No  Homicidal Thoughts:  No  Memory:  Immediate;   Fair Recent;   Fair Remote;   Fair  Judgement:  Fair  Insight:  Fair  Psychomotor Activity:  Normal  Concentration:  Fair  Recall:  [employer] of Knowledge:Fair  Language: Fair  Akathisia:  No  Handed:  Right  AIMS (if indicated):     Assets:  Desire for Improvement Housing  Sleep:  Number of Hours: 6  Cognition: WNL  ADL's:  Intact   Mental Status Per Nursing Assessment::   On Admission:  Self-harm thoughts, Self-harm behaviors  Demographic Factors:  Male and Adolescent or young adult  Loss Factors: NA  Historical Factors: Prior suicide attempts  Risk Reduction Factors:   Sense of responsibility to family,  Religious beliefs about death, Living with another person, especially a relative, Positive social support and Positive therapeutic relationship  Continued Clinical Symptoms:  Depression:   Impulsivity  Cognitive Features That Contribute To Risk:  Loss of executive function    Suicide Risk:  Minimal: No identifiable suicidal ideation.  Patients presenting with no risk factors but with morbid ruminations; may be classified as minimal risk based on the severity of the depressive symptoms  Follow-up [facility] Follow up.   Why: Please continue peer support services with Pamala Hurry and therapy with Malachy Mood as normal upon discharge. Thank you! Contact information: [place] [address] [place] Follow up on 09/16/2018.   Why: Dr. Maurie Boettcher with [facility] will be contacting you on Thursday, June 25th at 3:30pm for a telemed appointment.   Contact information: [address] [place], [place]  [address] Phone [phone] Fax [phone]          Plan Of Care/Follow-up recommendations:  Activity:  Activity as tolerated Diet:  Regular diet Other:  Follow-up with outpatient providers  Alethia Berthold, MD 09/08/2018, 5:58 PM

## 2018-09-09 MED ORDER — OXCARBAZEPINE 600 MG PO TABS: 600.0000 mg | ORAL_TABLET | Freq: Two times a day (BID) | ORAL | 1 refills | Status: DC

## 2018-09-09 MED ORDER — ALBUTEROL SULFATE HFA 108 (90 BASE) MCG/ACT IN AERS: 1.0000 | INHALATION_SPRAY | RESPIRATORY_TRACT | 1 refills | Status: DC | PRN

## 2018-09-09 MED ORDER — CEPHALEXIN 500 MG PO CAPS: 500.0000 mg | ORAL_CAPSULE | Freq: Four times a day (QID) | ORAL | 0 refills | Status: DC

## 2018-09-09 MED ORDER — LURASIDONE HCL 80 MG PO TABS: 80.0000 mg | ORAL_TABLET | Freq: Every day | ORAL | 1 refills | Status: DC

## 2018-09-09 MED ORDER — TEMAZEPAM 7.5 MG PO CAPS: 7.5000 mg | ORAL_CAPSULE | Freq: Every evening | ORAL | 0 refills | Status: DC | PRN

## 2018-09-09 MED ORDER — FLUVOXAMINE MALEATE 50 MG PO TABS: 150.0000 mg | ORAL_TABLET | Freq: Every day | ORAL | 1 refills | Status: DC

## 2018-09-09 MED ORDER — HYDROXYZINE HCL 25 MG PO TABS: 25.0000 mg | ORAL_TABLET | Freq: Three times a day (TID) | ORAL | 1 refills | Status: DC | PRN

## 2018-09-09 NOTE — Discharge Summary (Signed)
Physician Discharge Summary Note  Patient:  Joel Arroyo is an 23 y.o., male MRN:  [id number] DOB:  [birth year] Patient phone:  [phone] (home)  Patient address:   [address] [place] [address],  Total Time spent with patient: 45 minutes  Date of Admission:  09/03/2018 Date of Discharge: September 09, 2018  Reason for Admission: Admitted through the emergency room where he presented reporting suicidal ideation and depressed mood  Principal Problem: MDD (major depressive disorder), severe ([place]) Discharge Diagnoses: Principal Problem:   MDD (major depressive disorder), severe ([place]) Active Problems:   ADHD (attention deficit hyperactivity disorder)   Suicidal ideation   Major depression, recurrent ([place])   Past Psychiatric History: Patient has a long history of depression mood instability and behavior problems as well as ADHD  Past Medical History:  Past Medical History:  Diagnosis Date  . Anxiety   . Asthma   . Bipolar 1 disorder ([place])   . Depression   . Seizures ([place])     Past Surgical History:  Procedure Laterality Date  . HERNIA REPAIR     Family History:  Family History  Problem Relation Age of Onset  . Kidney cancer Neg Hx   . Kidney disease Neg Hx   . Prostate cancer Neg Hx    Family Psychiatric  History: See previous Social History:  Social History   Substance and Sexual Activity  Alcohol Use No     Social History   Substance and Sexual Activity  Drug Use No    Social History   Socioeconomic History  . Marital status: Single    Spouse name: Not on file  . Number of children: Not on file  . Years of education: Not on file  . Highest education level: Not on file  Occupational History  . Not on file  Social Needs  . Financial resource strain: Not on file  . Food insecurity    Worry: Not on file    Inability: Not on file  . Transportation needs    Medical: Not on file    Non-medical: Not on file  Tobacco Use  . Smoking status: Current  Every Day Smoker    Packs/day: 0.50    Types: Cigarettes  . Smokeless tobacco: Never Used  Substance and Sexual Activity  . Alcohol use: No  . Drug use: No  . Sexual activity: Not on file  Lifestyle  . Physical activity    Days per week: Not on file    Minutes per session: Not on file  . Stress: Not on file  Relationships  . Social Herbalist on phone: Not on file    Gets together: Not on file    Attends religious service: Not on file    Active member of club or organization: Not on file    Attends meetings of clubs or organizations: Not on file    Relationship status: Not on file  Other Topics Concern  . Not on file  Social History Narrative  . Not on file    Hospital Course: Patient admitted to the hospital.  15-minute checks used.  Patient did not display any dangerous or aggressive behavior during his hospital stay.  Did not display any self injury behavior.  He was cooperative and appropriate with treatment planning.  He was forthcoming about symptoms.  Participated appropriately in groups and activities.  Increased dose of Latuda to 80 mg a day which he tolerated fine.  Patient reported that the  suicidal thoughts and "visions" gradually disappeared and had been gone completely for over a day at the time of discharge.  He states he feels like he has improved his coping skills and will agree to continued follow-up with [facility] and medication management.  Physical Findings: AIMS: Facial and Oral Movements Muscles of Facial Expression: None, normal Lips and Perioral Area: None, normal Jaw: None, normal Tongue: None, normal,Extremity Movements Upper (arms, wrists, hands, fingers): None, normal Lower (legs, knees, ankles, toes): None, normal, Trunk Movements Neck, shoulders, hips: None, normal, Overall Severity Severity of abnormal movements (highest score from questions above): None, normal Incapacitation due to abnormal movements: None, normal Patient's awareness of  abnormal movements (rate only patient's report): No Awareness, Dental Status Current problems with teeth and/or dentures?: No Does patient usually wear dentures?: No  CIWA:  CIWA-Ar Total: 0 COWS:  COWS Total Score: 2  Musculoskeletal: Strength & Muscle Tone: within normal limits Gait & Station: normal Patient leans: N/A  Psychiatric Specialty Exam: Physical Exam  Nursing note and vitals reviewed. Constitutional: He appears well-developed and well-nourished.  HENT:  Head: Normocephalic and atraumatic.  Eyes: Pupils are equal, round, and reactive to light. Conjunctivae are normal.  Neck: Normal range of motion.  Cardiovascular: Regular rhythm and normal heart sounds.  Respiratory: Effort normal. No respiratory distress.  GI: Soft.  Musculoskeletal: Normal range of motion.  Neurological: He is alert.  Skin: Skin is warm and dry.  Psychiatric: He has a normal mood and affect. His speech is normal and behavior is normal. Judgment and thought content normal. Cognition and memory are normal.    Review of Systems  Constitutional: Negative.   HENT: Negative.   Eyes: Negative.   Respiratory: Negative.   Cardiovascular: Negative.   Gastrointestinal: Negative.   Musculoskeletal: Negative.   Skin: Negative.   Neurological: Negative.   Psychiatric/Behavioral: Negative.     Blood pressure 128/83, pulse 62, temperature (!) 97.4 F (36.3 C), temperature source Oral, resp. rate 16, height 6' (1.829 m), weight 86.2 kg, SpO2 99 %.Body mass index is 25.77 kg/m.  General Appearance: Casual  Eye Contact:  Good  Speech:  Clear and Coherent  Volume:  Normal  Mood:  Euthymic  Affect:  Congruent  Thought Process:  Goal Directed  Orientation:  Full (Time, Place, and Person)  Thought Content:  Logical  Suicidal Thoughts:  No  Homicidal Thoughts:  No  Memory:  Immediate;   Fair Recent;   Fair Remote;   Fair  Judgement:  Fair  Insight:  Fair  Psychomotor Activity:  Normal  Concentration:   Concentration: Fair  Recall:  FiservFair  Fund of Knowledge:  Fair  Language:  Fair  Akathisia:  No  Handed:  Right  AIMS (if indicated):     Assets:  Desire for Improvement Housing  ADL's:  Intact  Cognition:  WNL  Sleep:  Number of Hours: 7.5     Have you used any form of tobacco in the last 30 days? (Cigarettes, Smokeless Tobacco, Cigars, and/or Pipes): Yes  Has this patient used any form of tobacco in the last 30 days? (Cigarettes, Smokeless Tobacco, Cigars, and/or Pipes) Yes, Yes, A prescription for an FDA-approved tobacco cessation medication was offered at discharge and the patient refused  Blood Alcohol level:  Lab Results  Component Value Date   [facility]   ETH <10 08/17/2018    Metabolic Disorder Labs:  Lab Results  Component Value Date   HGBA1C 5.3 09/05/2016   MPG 105 09/05/2016  No results found for: PROLACTIN No results found for: CHOL, TRIG, HDL, CHOLHDL, VLDL, LDLCALC  See Psychiatric Specialty Exam and Suicide Risk Assessment completed by Attending Physician prior to discharge.  Discharge destination:  Home  Is patient on multiple antipsychotic therapies at discharge:  No   Has Patient had three or more failed trials of antipsychotic monotherapy by history:  No  Recommended Plan for Multiple Antipsychotic Therapies: NA  Discharge Instructions    Diet - low sodium heart healthy   Complete by: As directed    Increase activity slowly   Complete by: As directed      Allergies as of 09/09/2018   No Known Allergies     Medication List    TAKE these medications     Indication  albuterol 108 (90 Base) MCG/ACT inhaler Commonly known as: VENTOLIN HFA Inhale 1 puff into the lungs every 4 (four) hours as needed (SOB).  Indication: Asthma   cephALEXin 500 MG capsule Commonly known as: KEFLEX Take 1 capsule (500 mg total) by mouth every 6 (six) hours. What changed: when to take this  Indication: Infection of the Skin and/or Skin Structures    fluvoxaMINE 50 MG tablet Commonly known as: LUVOX Take 3 tablets (150 mg total) by mouth at bedtime. What changed:   medication strength  how much to take  Indication: Obsessive Compulsive Disorder   hydrOXYzine 25 MG tablet Commonly known as: ATARAX/VISTARIL Take 1 tablet (25 mg total) by mouth 3 (three) times daily as needed for anxiety.  Indication: Feeling Anxious   lurasidone 80 MG Tabs tablet Commonly known as: LATUDA Take 1 tablet (80 mg total) by mouth daily with supper. What changed:   medication strength  how much to take  Indication: Depressive Phase of Manic-Depression   oxcarbazepine 600 MG tablet Commonly known as: TRILEPTAL Take 1 tablet (600 mg total) by mouth 2 (two) times daily.  Indication: Bipolar disorder   temazepam 7.5 MG capsule Commonly known as: RESTORIL Take 1 capsule (7.5 mg total) by mouth at bedtime as needed for sleep.  Indication: Trouble Sleeping   Vyvanse 70 MG capsule Generic drug: lisdexamfetamine Take 70 mg by mouth daily.  Indication: Attention Deficit Hyperactivity Disorder      Follow-up Information    [facility] Follow up.   Why: Please continue peer support services with Britta MccreedyBarbara and therapy with Elnita MaxwellCheryl as normal upon discharge. Thank you! Contact information: [address] [place] [phone]        [facility] Follow up on 09/16/2018.   Why: Dr. Orland DecBarrow with [facility] will be contacting you on Thursday, June 25th at 3:30pm for a telemed appointment.   Contact information: [address] [place], [place]  [address] Phone [phone] Fax [phone]          Follow-up recommendations:  Activity:  Activity as tolerated Diet:  Regular diet Other:  Follow-up with outpatient care  Comments: Psychoeducation and supportive counseling completed.  At the time of discharge he no longer meets commitment criteria and is  agreeable to outpatient treatment  Signed: Mordecai RasmussenJohn , MD 09/09/2018, 11:30 AM

## 2018-09-09 NOTE — Progress Notes (Signed)
Recreation Therapy Notes  INPATIENT RECREATION TR PLAN  Patient Details Name: Joel Arroyo MRN: [id number] DOB: 04-Apr-1995 Today's Date: 09/09/2018  Rec Therapy Plan Is patient appropriate for Therapeutic Recreation?: Yes Treatment times per week: at least 3 Estimated Length of Stay: 5-7 days TR Treatment/Interventions: Group participation (Comment)  Discharge Criteria Pt will be discharged from therapy if:: Discharged Treatment plan/goals/alternatives discussed and agreed upon by:: Patient/family  Discharge Summary Short term goals set: Patient will engage in groups without prompting or encouragement from LRT x3 group sessions within 5 recreation therapy group sessions Short term goals met: Complete Progress toward goals comments: Groups attended Which groups?: Communication, Anger management, Coping skills Reason goals not met: N/A Therapeutic equipment acquired: N/A Reason patient discharged from therapy: Discharge from hospital Pt/family agrees with progress & goals achieved: Yes Date patient discharged from therapy: 09/09/18   Jaimy Kliethermes 09/09/2018, 1:24 PM

## 2018-09-09 NOTE — Plan of Care (Signed)
Patient with improving ability to seek help and express his needs and frustrations. Medicated for pain, severe headache with satisfactory results. Denies SI at present. Will continue to monitor and adjust treatment as ordered/indicated.

## 2018-09-09 NOTE — Progress Notes (Signed)
Recreation Therapy Notes   Date: 09/09/2018  Time: 9:30 am  Location: Craft room  Behavioral response: Appropriate  Intervention Topic: Communication  Discussion/Intervention:  Group content today was focused on communication. The group defined communication and ways to communicate with others. Individuals stated reason why communication is important and some reasons to communicate with others. Patients expressed if they thought they were good at communicating with others and ways they could improve their communication skills. The group identified important parts of communication and some experiences they have had in the past with communication. The group participated in the intervention "What is that?", where they had a chance to test out their communication skills and identify ways to improve their communication techniques.  Clinical Observations/Feedback:  Patient came to group and defined communication as talking to others. He explained that communication is important to get emotions and feelings out.  Individual was social with peers and staff while participating in the intervention. Isaih Bulger LRT/CTRS         Veniamin Kincaid 09/09/2018 10:32 AM

## 2018-09-09 NOTE — Plan of Care (Signed)
  Problem: Education: Goal: Ability to make informed decisions regarding treatment will improve Outcome: Adequate for Discharge   Problem: Coping: Goal: Coping ability will improve Outcome: Adequate for Discharge   Problem: Health Behavior/Discharge Planning: Goal: Identification of resources available to assist in meeting health care needs will improve Outcome: Adequate for Discharge   Problem: Medication: Goal: Compliance with prescribed medication regimen will improve Outcome: Adequate for Discharge   Problem: Self-Concept: Goal: Ability to disclose and discuss suicidal ideas will improve Outcome: Adequate for Discharge Goal: Will verbalize positive feelings about self Outcome: Adequate for Discharge   Problem: Education: Goal: Utilization of techniques to improve thought processes will improve Outcome: Adequate for Discharge Goal: Knowledge of the prescribed therapeutic regimen will improve Outcome: Adequate for Discharge   Problem: Activity: Goal: Interest or engagement in leisure activities will improve Outcome: Adequate for Discharge Goal: Imbalance in normal sleep/wake cycle will improve Outcome: Adequate for Discharge   Problem: Coping: Goal: Coping ability will improve Outcome: Adequate for Discharge Goal: Will verbalize feelings Outcome: Adequate for Discharge   Problem: Health Behavior/Discharge Planning: Goal: Ability to make decisions will improve Outcome: Adequate for Discharge Goal: Compliance with therapeutic regimen will improve Outcome: Adequate for Discharge   Problem: Role Relationship: Goal: Will demonstrate positive changes in social behaviors and relationships Outcome: Adequate for Discharge   Problem: Safety: Goal: Ability to disclose and discuss suicidal ideas will improve Outcome: Adequate for Discharge Goal: Ability to identify and utilize support systems that promote safety will improve Outcome: Adequate for Discharge   Problem:  Self-Concept: Goal: Will verbalize positive feelings about self Outcome: Adequate for Discharge Goal: Level of anxiety will decrease Outcome: Adequate for Discharge

## 2018-09-09 NOTE — Progress Notes (Signed)
  [facility] Adult Case Management Discharge Plan :  Will you be returning to the same living situation after discharge:  Yes,  home At discharge, do you have transportation home?: Yes,  step-mom will pick pt up at 12 Do you have the ability to pay for your medications: Yes,  medicaid insurace  Release of information consent forms completed and in the chart;    Patient to Follow up at: Follow-up [facility] Follow up.   Why: Please continue peer support services with Pamala Hurry and therapy with Malachy Mood as normal upon discharge. Thank you! Contact information: [place] [address] [place] Follow up on 09/16/2018.   Why: Dr. Maurie Boettcher with [facility] will be contacting you on Thursday, June 25th at 3:30pm for a telemed appointment.   Contact information: [address] [place], [place]  [address] Phone [phone] Fax [phone]          Next level of care provider has access to [facility] and Suicide Prevention discussed: Yes,  SPE completed with pts mother and legal guardian  Have you used any form of tobacco in the last 30 days? (Cigarettes, Smokeless Tobacco, Cigars, and/or Pipes): Yes  Has patient been referred to the Quitline?: Patient refused referral  Patient has been referred for addiction treatment: N/A  Delfin Edis, LCSW 09/09/2018, 9:15 AM

## 2018-09-09 NOTE — Progress Notes (Signed)
D- Patient alert and oriented. Agitated and restless. Denies SI. Medicated for pain with satisfactory results.  A- Scheduled medications administered to patient, per MD orders. Support and encouragement provided.  Routine safety checks conducted every 15 minutes.  Patient informed to notify staff with problems or concerns. R- No adverse drug reactions noted. Patient contracts for safety at this time. Patient compliant with medications and treatment plan. Patient receptive, calm, and cooperative.  Patient remains safe at this time.

## 2018-09-09 NOTE — Progress Notes (Signed)
D- Patient alert and oriented. Affect/mood. Denies SI, medicated for pain. Goal not set at present.  A- Scheduled medications administered to patient, per MD orders. Support and encouragement provided.  Routine safety checks conducted every 15 minutes.  Patient informed to notify staff with problems or concerns. R- No adverse drug reactions noted. Patient contracts for safety at this time. Patient compliant with medications and treatment plan. Patient receptive, calm, and cooperative.  Patient remains safe at this time.

## 2018-09-09 NOTE — Plan of Care (Signed)
Patient demonstrating some ability to comply with techniques that help him make informed decisions, engage in more leisure activities as evidenced by his participation in activities in group room. Able to express his needs and make more informed decisions. Problem: Health Behavior/Discharge Planning: Goal: Compliance with therapeutic regimen will improve Outcome: Progressing   Problem: Self-Concept: Goal: Ability to disclose and discuss suicidal ideas will improve Outcome: Not Progressing   Problem: Activity: Goal: Imbalance in normal sleep/wake cycle will improve Outcome: Not Progressing   Problem: Coping: Goal: Coping ability will improve Outcome: Not Progressing Goal: Will verbalize feelings Outcome: Not Progressing   Problem: Self-Concept: Goal: Ability to disclose and discuss suicidal ideas will improve Outcome: Not Progressing

## 2018-09-09 NOTE — Progress Notes (Signed)
D: Patient is aware of  Discharge this shift .Patient denies suicidal /homicidal ideations. Patient received all belongings brought in   A: No Storage medications. Writer reviewed Discharge Summary, Suicide Risk Assessment, and Transitional Record. Patient also received Prescriptions   from  MD.  Aware  Of follow up appointment .  R: Patient left unit with no questions  Or concerns  With parents

## 2018-09-09 NOTE — Plan of Care (Signed)
Problem: Education: Goal: Ability to make informed decisions regarding treatment will improve 09/09/2018 1250 by Leodis Liverpool, RN Outcome: Adequate for Discharge 09/09/2018 1208 by Leodis Liverpool, RN Outcome: Adequate for Discharge   Problem: Coping: Goal: Coping ability will improve 09/09/2018 1250 by Leodis Liverpool, RN Outcome: Adequate for Discharge 09/09/2018 1208 by Leodis Liverpool, RN Outcome: Adequate for Discharge   Problem: Health Behavior/Discharge Planning: Goal: Identification of resources available to assist in meeting health care needs will improve 09/09/2018 1250 by Leodis Liverpool, RN Outcome: Adequate for Discharge 09/09/2018 1208 by Leodis Liverpool, RN Outcome: Adequate for Discharge   Problem: Medication: Goal: Compliance with prescribed medication regimen will improve 09/09/2018 1250 by Leodis Liverpool, RN Outcome: Adequate for Discharge 09/09/2018 1208 by Leodis Liverpool, RN Outcome: Adequate for Discharge   Problem: Medication: Goal: Compliance with prescribed medication regimen will improve 09/09/2018 1250 by Leodis Liverpool, RN Outcome: Adequate for Discharge 09/09/2018 1208 by Leodis Liverpool, RN Outcome: Adequate for Discharge   Problem: Education: Goal: Utilization of techniques to improve thought processes will improve 09/09/2018 1250 by Leodis Liverpool, RN Outcome: Adequate for Discharge 09/09/2018 1208 by Leodis Liverpool, RN Outcome: Adequate for Discharge Goal: Knowledge of the prescribed therapeutic regimen will improve 09/09/2018 1250 by Leodis Liverpool, RN Outcome: Adequate for Discharge 09/09/2018 1208 by Leodis Liverpool, RN Outcome: Adequate for Discharge   Problem: Activity: Goal: Interest or engagement in leisure activities will improve 09/09/2018 1250 by Leodis Liverpool, RN Outcome: Adequate for Discharge 09/09/2018 1208 by Leodis Liverpool, RN Outcome: Adequate for Discharge Goal: Imbalance in normal sleep/wake cycle will  improve 09/09/2018 1250 by Leodis Liverpool, RN Outcome: Adequate for Discharge 09/09/2018 1208 by Leodis Liverpool, RN Outcome: Adequate for Discharge   Problem: Activity: Goal: Interest or engagement in leisure activities will improve 09/09/2018 1250 by Leodis Liverpool, RN Outcome: Adequate for Discharge 09/09/2018 1208 by Leodis Liverpool, RN Outcome: Adequate for Discharge Goal: Imbalance in normal sleep/wake cycle will improve 09/09/2018 1250 by Leodis Liverpool, RN Outcome: Adequate for Discharge 09/09/2018 1208 by Leodis Liverpool, RN Outcome: Adequate for Discharge   Problem: Coping: Goal: Coping ability will improve 09/09/2018 1250 by Leodis Liverpool, RN Outcome: Adequate for Discharge 09/09/2018 1208 by Leodis Liverpool, RN Outcome: Adequate for Discharge Goal: Will verbalize feelings 09/09/2018 1250 by Leodis Liverpool, RN Outcome: Adequate for Discharge 09/09/2018 1208 by Leodis Liverpool, RN Outcome: Adequate for Discharge   Problem: Health Behavior/Discharge Planning: Goal: Ability to make decisions will improve 09/09/2018 1250 by Leodis Liverpool, RN Outcome: Adequate for Discharge 09/09/2018 1208 by Leodis Liverpool, RN Outcome: Adequate for Discharge Goal: Compliance with therapeutic regimen will improve 09/09/2018 1250 by Leodis Liverpool, RN Outcome: Adequate for Discharge 09/09/2018 1208 by Leodis Liverpool, RN Outcome: Adequate for Discharge   Problem: Role Relationship: Goal: Will demonstrate positive changes in social behaviors and relationships 09/09/2018 1250 by Leodis Liverpool, RN Outcome: Adequate for Discharge 09/09/2018 1208 by Leodis Liverpool, RN Outcome: Adequate for Discharge   Problem: Safety: Goal: Ability to disclose and discuss suicidal ideas will improve 09/09/2018 1250 by Leodis Liverpool, RN Outcome: Adequate for Discharge 09/09/2018 1208 by Leodis Liverpool, RN Outcome: Adequate for Discharge Goal: Ability to identify and utilize support systems that  promote safety will improve 09/09/2018 1250 by Leodis Liverpool, RN Outcome: Adequate for Discharge 09/09/2018 1208 by Leodis Liverpool, RN Outcome:  Adequate for Discharge   Problem: Self-Concept: Goal: Will verbalize positive feelings about self 09/09/2018 1250 by Crist InfanteFarrish, Adamarys Shall A, RN Outcome: Adequate for Discharge 09/09/2018 1208 by Crist InfanteFarrish, Torin Whisner A, RN Outcome: Adequate for Discharge Goal: Level of anxiety will decrease 09/09/2018 1250 by Crist InfanteFarrish, Niana Martorana A, RN Outcome: Adequate for Discharge 09/09/2018 1208 by Crist InfanteFarrish, Coltan Spinello A, RN Outcome: Adequate for Discharge

## 2018-10-13 ENCOUNTER — Other Ambulatory Visit: Payer: Self-pay

## 2018-10-13 DIAGNOSIS — Z20822 Contact with and (suspected) exposure to covid-19: Secondary | ICD-10-CM

## 2018-10-16 LAB — NOVEL CORONAVIRUS, NAA: SARS-CoV-2, NAA: NOT DETECTED

## 2018-12-09 IMAGING — CT CT PELVIS W/ CM
2 of 3 series · 17 of 46 positions shown, 19 images · IV contrast (iopamidol)
Comparison: 02/06/2005 renal sonogram.

CLINICAL DATA: 21-year-old male with suprapubic/groin pain for 1
month. Nocturia. History of left inguinal hernia repair.

EXAM:
CT PELVIS WITH CONTRAST
TECHNIQUE: Multidetector CT imaging of the pelvis was performed using the
standard protocol following the bolus administration of intravenous
contrast.
CONTRAST:  100mL [id number] IOPAMIDOL ([id number]) INJECTION 61%

[Series 2: pelvis · axial · 0.68mm/px · z∈[-1642,-1387]mm · 14 of 59 slices shown, 16 images (1 of 2)]
[im 4/59  soft-tissue]
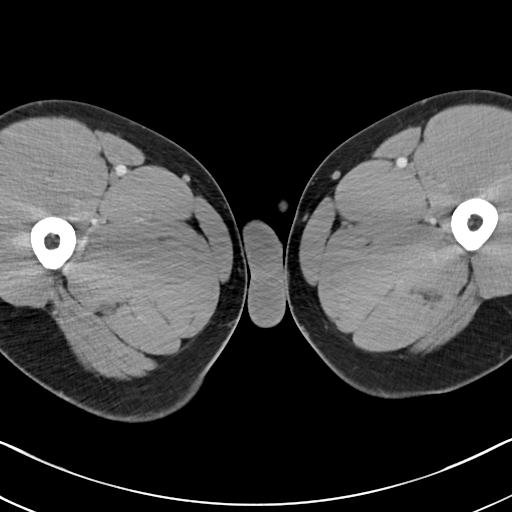
[im 4/59  bone]
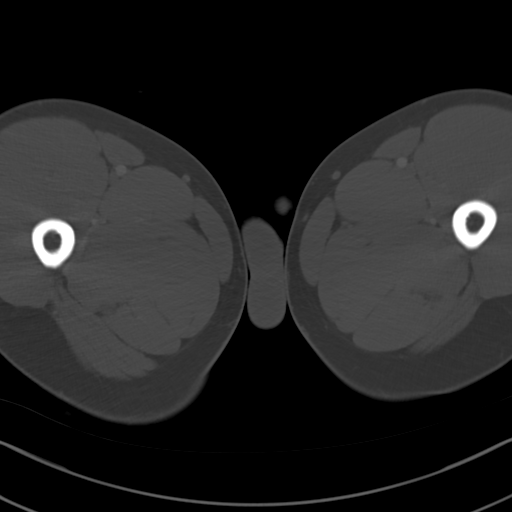
[im 8/59  soft-tissue]
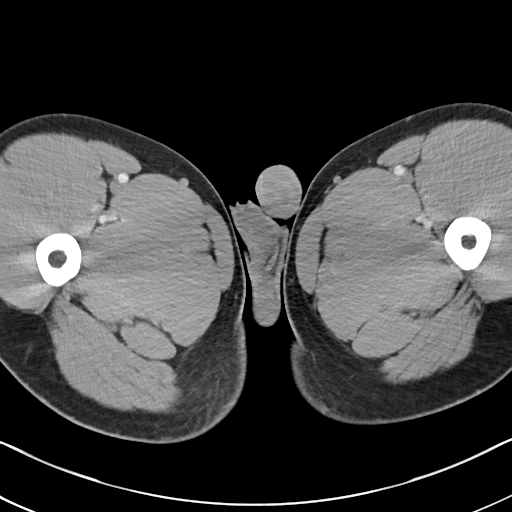
[im 12/59  soft-tissue]
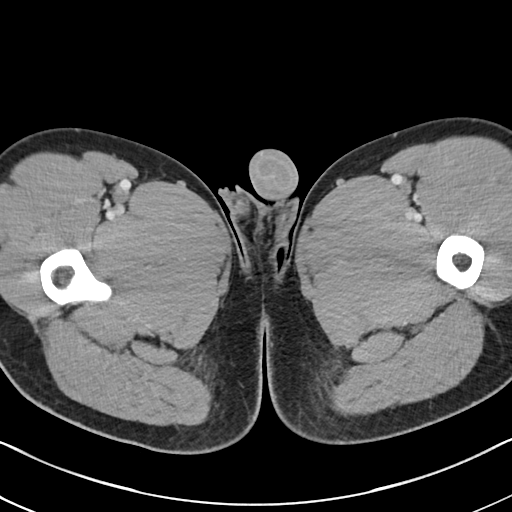
[im 15/59  soft-tissue]
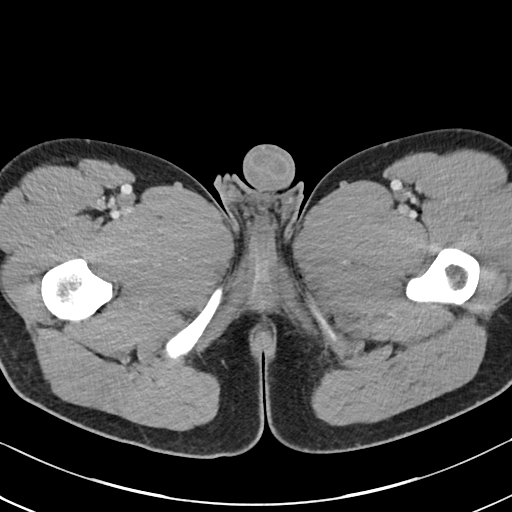
[im 19/59  soft-tissue]
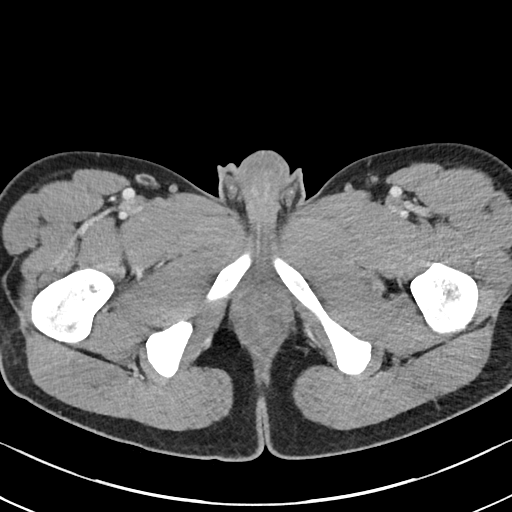
[im 23/59  soft-tissue]
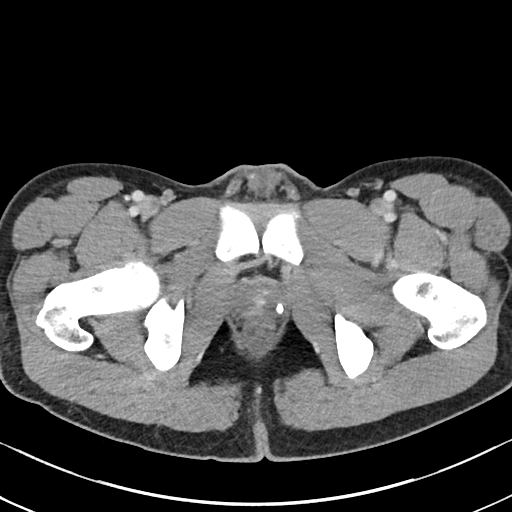
[im 27/59  soft-tissue]
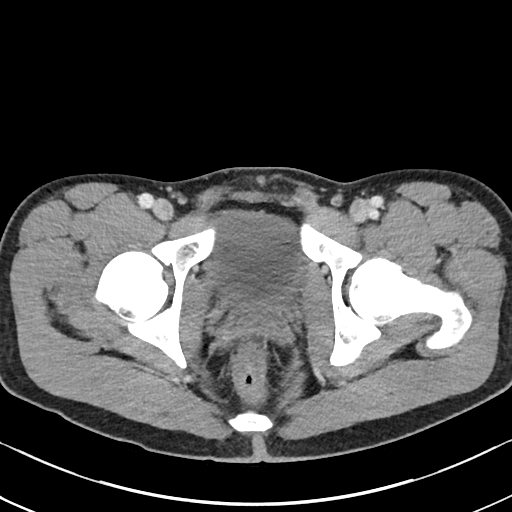
[im 32/59  soft-tissue]
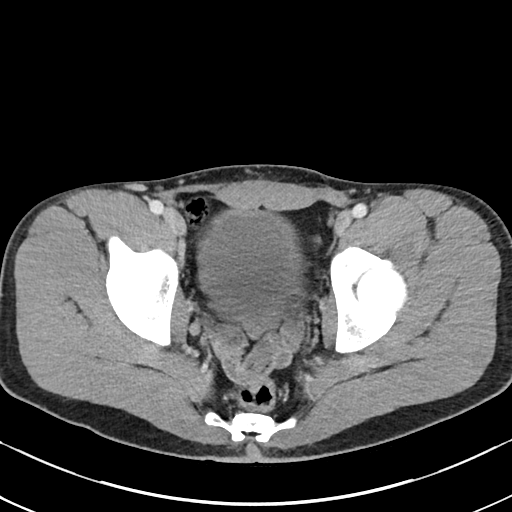
[im 36/59  soft-tissue]
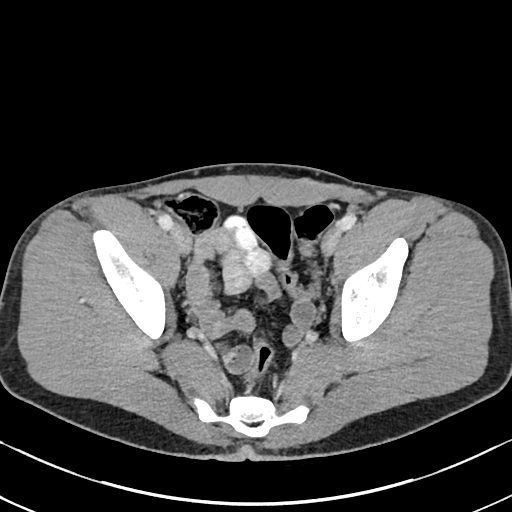
[im 36/59  bone]
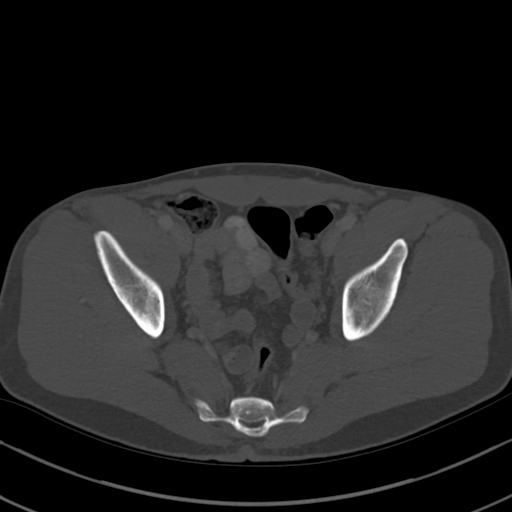
[im 40/59  soft-tissue]
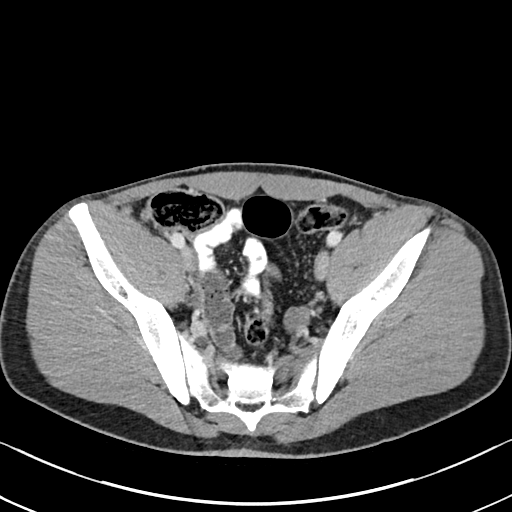
[im 44/59  soft-tissue]
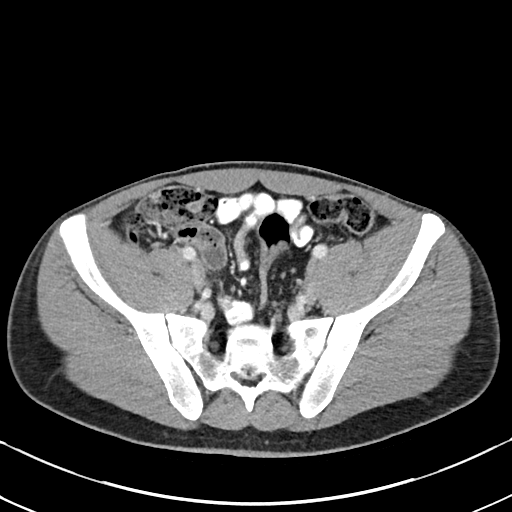
[im 47/59  soft-tissue]
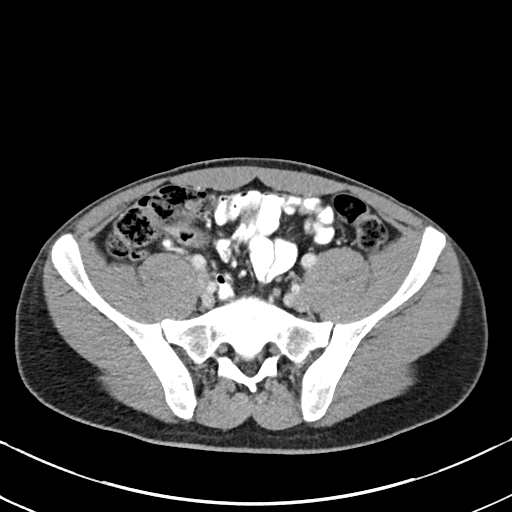
[im 51/59  soft-tissue]
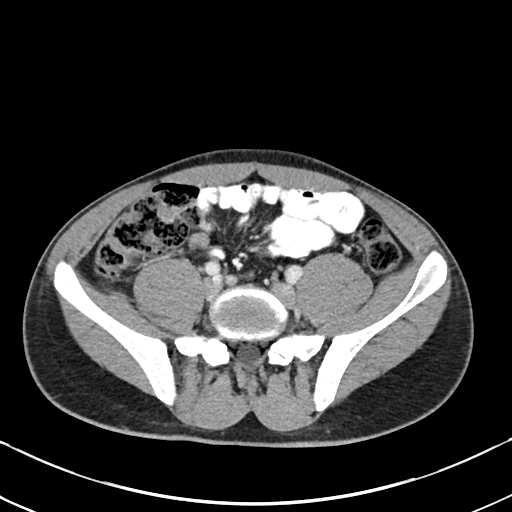
[im 55/59  soft-tissue]
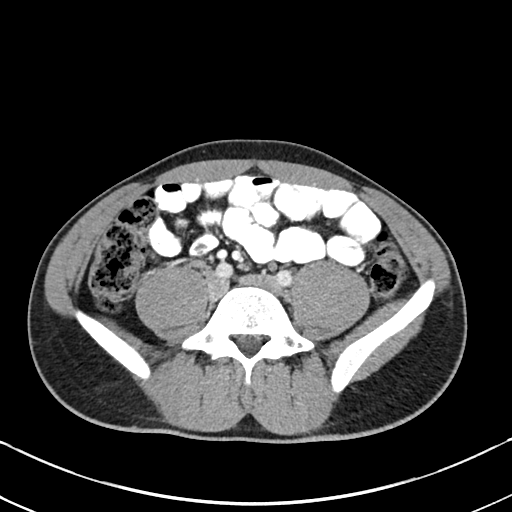

[Series 3: pelvis · coronal · 0.58mm/px · 3 of 130 slices shown (2 of 2)]
[im 44/130  soft-tissue]
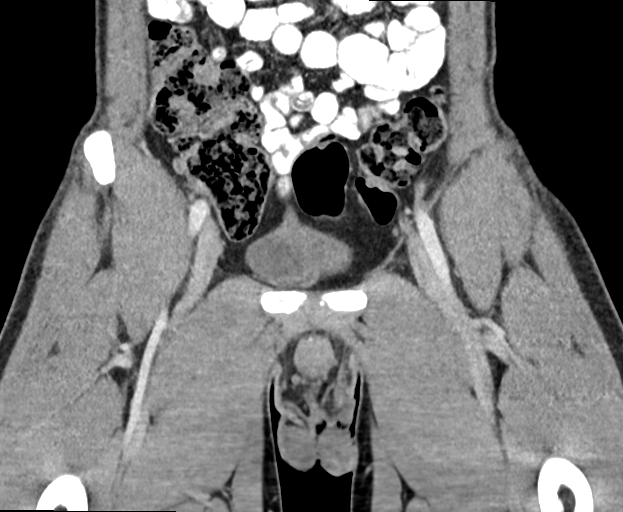
[im 58/130  soft-tissue]
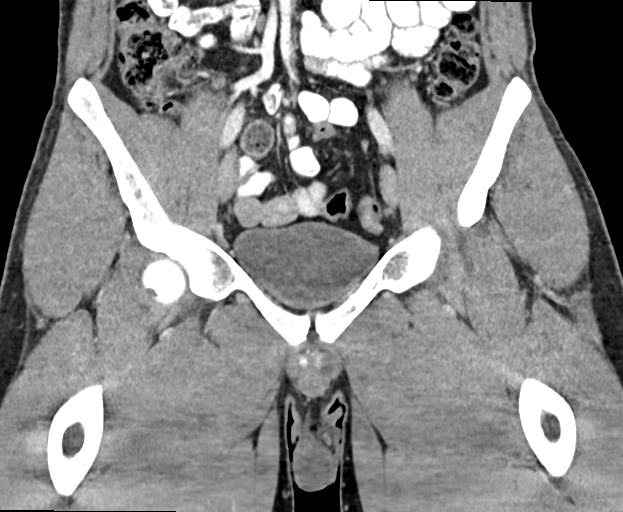
[im 72/130  soft-tissue]
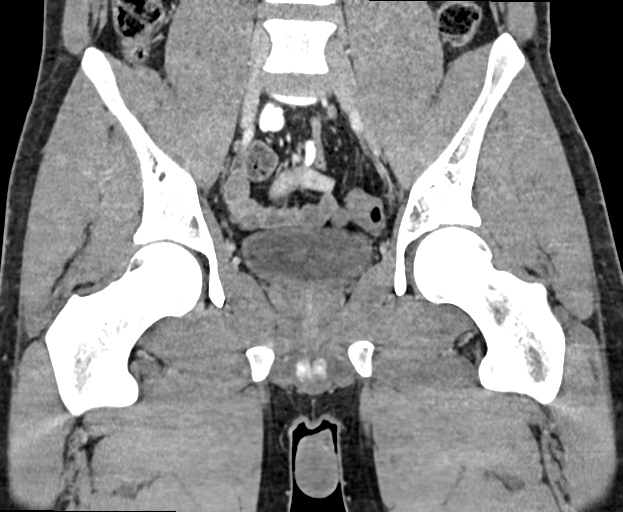

[17 of 46 positions shown; findings below may reference images not displayed]

FINDINGS: Urinary Tract:  Normal bladder.  Distal ureters are normal caliber.

Bowel: Normal caliber pelvic bowel loops with no bowel wall
thickening. Oral contrast transits to the pelvic small bowel. Normal
appendix.

Vascular/Lymphatic: No acute vascular abnormality. No pathologically
enlarged pelvic lymph nodes.

Reproductive: Normal size prostate. Nonspecific coarse left
prostatic calcifications. Symmetric normal seminal vesicles. Normal
scrotum.

Other: No focal fluid collections. No pelvic ascites. No
pneumoperitoneum. No evidence of a recurrent inguinal hernia. No
superficial mass or fluid collection.

Musculoskeletal: No aggressive appearing focal osseous lesions.
IMPRESSION: No acute abnormality. No pelvic adenopathy. No evidence of a
recurrent inguinal hernia. No superficial mass. No fluid
collections.

## 2018-12-24 ENCOUNTER — Emergency Department
Admission: EM | Admit: 2018-12-24 | Discharge: 2018-12-25 | Disposition: A | Discharge status: Home / Self Care | Payer: Medicaid Other | Attending: Emergency Medicine | Admitting: Emergency Medicine

## 2018-12-24 ENCOUNTER — Other Ambulatory Visit: Payer: Self-pay

## 2018-12-24 ENCOUNTER — Encounter: Payer: Self-pay | Admitting: Intensive Care

## 2018-12-24 DIAGNOSIS — Z20828 Contact with and (suspected) exposure to other viral communicable diseases: Secondary | ICD-10-CM | POA: Insufficient documentation

## 2018-12-24 DIAGNOSIS — R45851 Suicidal ideations: Secondary | ICD-10-CM | POA: Diagnosis not present

## 2018-12-24 DIAGNOSIS — Z79899 Other long term (current) drug therapy: Secondary | ICD-10-CM | POA: Diagnosis not present

## 2018-12-24 DIAGNOSIS — F4325 Adjustment disorder with mixed disturbance of emotions and conduct: Secondary | ICD-10-CM | POA: Insufficient documentation

## 2018-12-24 DIAGNOSIS — J45909 Unspecified asthma, uncomplicated: Secondary | ICD-10-CM | POA: Diagnosis not present

## 2018-12-24 DIAGNOSIS — F319 Bipolar disorder, unspecified: Secondary | ICD-10-CM | POA: Diagnosis not present

## 2018-12-24 DIAGNOSIS — F329 Major depressive disorder, single episode, unspecified: Secondary | ICD-10-CM | POA: Diagnosis present

## 2018-12-24 DIAGNOSIS — F17210 Nicotine dependence, cigarettes, uncomplicated: Secondary | ICD-10-CM | POA: Insufficient documentation

## 2018-12-24 HISTORY — DX: Post-traumatic stress disorder, unspecified: F43.10

## 2018-12-24 LAB — COMPREHENSIVE METABOLIC PANEL
ALT: 24 U/L (ref 0–44)
AST: 23 U/L (ref 15–41)
Albumin: 5.3 g/dL — ABNORMAL HIGH (ref 3.5–5.0)
Alkaline Phosphatase: 78 U/L (ref 38–126)
Anion gap: 9 (ref 5–15)
BUN: 16 mg/dL (ref 6–20)
CO2: 28 mmol/L (ref 22–32)
Calcium: 9.8 mg/dL (ref 8.9–10.3)
Chloride: 102 mmol/L (ref 98–111)
Creatinine, Ser: 0.96 mg/dL (ref 0.61–1.24)
GFR calc Af Amer: >60 mL/min (ref >60)
GFR calc non Af Amer: >60 mL/min (ref >60)
Glucose, Bld: 104 mg/dL — ABNORMAL HIGH (ref 70–99)
Potassium: 4.3 mmol/L (ref 3.5–5.1)
Sodium: 139 mmol/L (ref 135–145)
Total Bilirubin: 0.3 mg/dL (ref 0.3–1.2)
Total Protein: 8.2 g/dL — ABNORMAL HIGH (ref 6.5–8.1)

## 2018-12-24 LAB — CBC
HCT: 49.1 % (ref 39.0–52.0)
Hemoglobin: 16.5 g/dL (ref 13.0–17.0)
MCH: 31.5 pg (ref 26.0–34.0)
MCHC: 33.6 g/dL (ref 30.0–36.0)
MCV: 93.9 fL (ref 80.0–100.0)
Platelets: 264 10*3/uL (ref 150–400)
RBC: 5.23 MIL/uL (ref 4.22–5.81)
RDW: 12.1 % (ref 11.5–15.5)
WBC: 6.9 10*3/uL (ref 4.0–10.5)
nRBC: 0 % (ref 0.0–0.2)

## 2018-12-24 LAB — URINE DRUG SCREEN, QUALITATIVE (ARMC ONLY)
Amphetamines, Ur Screen: POSITIVE — AB
Barbiturates, Ur Screen: NOT DETECTED
Benzodiazepine, Ur Scrn: NOT DETECTED
Cannabinoid 50 Ng, Ur ~~LOC~~: NOT DETECTED
Cocaine Metabolite,Ur ~~LOC~~: NOT DETECTED
MDMA (Ecstasy)Ur Screen: NOT DETECTED
Methadone Scn, Ur: NOT DETECTED
Opiate, Ur Screen: NOT DETECTED
Phencyclidine (PCP) Ur S: NOT DETECTED
Tricyclic, Ur Screen: NOT DETECTED

## 2018-12-24 LAB — SALICYLATE LEVEL: Salicylate Lvl: <7 mg/dL (ref 2.8–30.0)

## 2018-12-24 LAB — ACETAMINOPHEN LEVEL: Acetaminophen (Tylenol), Serum: <10 ug/mL — ABNORMAL LOW (ref 10–30)

## 2018-12-24 LAB — SARS CORONAVIRUS 2 BY RT PCR (HOSPITAL ORDER, PERFORMED IN ~~LOC~~ HOSPITAL LAB): SARS Coronavirus 2: NEGATIVE

## 2018-12-24 LAB — ETHANOL: Alcohol, Ethyl (B): <10 mg/dL (ref <10)

## 2018-12-24 MED ORDER — NICOTINE 7 MG/24HR TD PT24
7.0000 mg | MEDICATED_PATCH | Freq: Every day | TRANSDERMAL | Status: DC
  Administered 2018-12-24 – 2018-12-25 (×2): 7 mg via TRANSDERMAL
  Filled 2018-12-24 (×3): qty 1
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
## 2018-12-24 NOTE — ED Notes (Signed)
Patient assigned to appropriate care area   Introduced self to pt  Patient oriented to unit/care area: Informed that, for their safety, care areas are designed for safety and visiting and phone hours explained to patient. Patient verbalizes understanding, and verbal contract for safety obtained  Environment secured  

## 2018-12-24 NOTE — ED Notes (Signed)
BEHAVIORAL HEALTH ROUNDING Patient sleeping: No. Patient alert and oriented: yes Behavior appropriate: Yes.  ; If no, describe:  Nutrition and fluids offered: yes Toileting and hygiene offered: Yes  Sitter present: q15 minute observations  

## 2018-12-24 NOTE — ED Notes (Signed)
Pt. Sitting in bed watching tv.  Pt. States "Feeling bad".  Pt. Engaged in conversation with this nurse and is calm and cooperative.

## 2018-12-24 NOTE — ED Triage Notes (Signed)
Patient arrived by [facility] with mom. Reports he is suicidal and homicidal. Stated "I was trying to make a noose out of string at home" Denies attempting.

## 2018-12-24 NOTE — ED Provider Notes (Signed)
[facility] Emergency Department Provider Note   ____________________________________________   First MD Initiated Contact with Patient 12/24/18 1117     (approximate)  I have reviewed the triage vital signs and the nursing notes.   HISTORY  Chief Complaint Suicidal and Homicidal    HPI Joel Arroyo is a 23 y.o. male for evaluation of suicidal ideation  Patient reports this morning he was thinking about hanging himself because family is not paying attention to him.  Reports a long history of depression.  Reports that these feelings have been present for a long time but he thought about taking action this morning.  He came with his family voluntarily after discussing with his counselor  Denies exposure to COVID.  No recent infections cough or illness.  Denies overdose or ingestion.  Reports compliance with his home medications   Past Medical History:  Diagnosis Date  . Anxiety   . Asthma   . Bipolar 1 disorder (HCC)   . Depression   . PTSD (post-traumatic stress disorder)   . Seizures [facility](HCC)     Patient Active Problem List   Diagnosis Date Noted  . Major depression, recurrent (HCC) 09/05/2018  . MDD (major depressive disorder), severe (HCC) 08/17/2018  . Self-inflicted laceration of wrist (HCC) 10/07/2017  . Cannabis abuse 10/07/2017  . Seizure-like activity (HCC)   . Psychosis (HCC)   . Seizures (HCC) 09/06/2016  . Bipolar 2 disorder, major depressive episode (HCC) 09/15/2015  . Suicidal ideation 09/15/2015  . Homicidal ideation 09/15/2015  . ADHD (attention deficit hyperactivity disorder) 09/14/2015  . Involuntary commitment 09/14/2015    Past Surgical History:  Procedure Laterality Date  . HERNIA REPAIR      Prior to Admission medications   Medication Sig Start Date End Date Taking? Authorizing Provider  albuterol (VENTOLIN HFA) 108 (90 Base) MCG/ACT inhaler Inhale 1 puff into the lungs every 4 (four) hours as needed (SOB).  09/09/18   Clapacs, Jackquline DenmarkJohn T, MD  cephALEXin (KEFLEX) 500 MG capsule Take 1 capsule (500 mg total) by mouth every 6 (six) hours. 09/09/18   Clapacs, Jackquline DenmarkJohn T, MD  fluvoxaMINE (LUVOX) 50 MG tablet Take 3 tablets (150 mg total) by mouth at bedtime. 09/09/18   Clapacs, Jackquline DenmarkJohn T, MD  hydrOXYzine (ATARAX/VISTARIL) 25 MG tablet Take 1 tablet (25 mg total) by mouth 3 (three) times daily as needed for anxiety. 09/09/18   Clapacs, Jackquline DenmarkJohn T, MD  lurasidone (LATUDA) 80 MG TABS tablet Take 1 tablet (80 mg total) by mouth daily with supper. 09/09/18   Clapacs, Jackquline DenmarkJohn T, MD  oxcarbazepine (TRILEPTAL) 600 MG tablet Take 1 tablet (600 mg total) by mouth 2 (two) times daily. 09/09/18   Clapacs, Jackquline DenmarkJohn T, MD  temazepam (RESTORIL) 7.5 MG capsule Take 1 capsule (7.5 mg total) by mouth at bedtime as needed for sleep. 09/09/18   Clapacs, Jackquline DenmarkJohn T, MD  VYVANSE 70 MG capsule Take 70 mg by mouth daily.  07/20/18   [provider]    Allergies Patient has no known allergies.  Family History  Problem Relation Age of Onset  . Kidney cancer Neg Hx   . Kidney disease Neg Hx   . Prostate cancer Neg Hx     Social History Social History   Tobacco Use  . Smoking status: Current Every Day Smoker    Packs/day: 0.50    Types: Cigarettes  . Smokeless tobacco: Never Used  Substance Use Topics  . Alcohol use: No  . Drug use: No  Review of Systems Constitutional: No fever/chills Cardiovascular: Denies chest pain. Respiratory: Denies shortness of breath. Gastrointestinal: No abdominal pain.   Genitourinary: Negative for dysuria. Musculoskeletal: Negative for back pain. Skin: Negative for rash. Neurological: Negative for headaches, areas of focal weakness or numbness. Psychiatric: Suicidal ideations this morning, now here voluntarily   ____________________________________________   PHYSICAL EXAM:  VITAL SIGNS: ED Triage Vitals  Enc Vitals Group     BP 12/24/18 1031 (!) 146/84     Pulse Rate 12/24/18 1031 64      Resp 12/24/18 1031 18     Temp 12/24/18 1031 (!) 97.5 F (36.4 C)     Temp Source 12/24/18 1031 Oral     SpO2 12/24/18 1031 99 %     Weight 12/24/18 1031 188 lb (85.3 kg)     Height 12/24/18 1031 6' (1.829 m)     Head Circumference --      Peak Flow --      Pain Score 12/24/18 1043 0     Pain Loc --      Pain Edu? --      Excl. in GC? --     Constitutional: Alert and oriented. Well appearing and in no acute distress. Eyes: Conjunctivae are normal. Head: Atraumatic. Nose: No congestion/rhinnorhea. Mouth/Throat: Mucous membranes are moist. Neck: No stridor.  Cardiovascular: Normal rate, regular rhythm. Marland Kitchen Respiratory: Normal respiratory effort.  Speaks full clear sentences. Gastrointestinal: Soft and nontender. No distention. Musculoskeletal: No lower extremity tenderness nor edema. Neurologic:  Normal speech and language. No gross focal neurologic deficits are appreciated.  Skin:  Skin is warm, dry and intact. No rash noted. Psychiatric: Mood and affect are normal. Speech and behavior are normal.  ____________________________________________   LABS (all labs ordered are listed, but only abnormal results are displayed)  Labs Reviewed  COMPREHENSIVE METABOLIC PANEL - Abnormal; Notable for the following components:      Result Value   Glucose, Bld 104 (*)    Total Protein 8.2 (*)    Albumin 5.3 (*)    All other components within normal limits  ACETAMINOPHEN LEVEL - Abnormal; Notable for the following components:   Acetaminophen (Tylenol), Serum <10 (*)    All other components within normal limits  URINE DRUG SCREEN, QUALITATIVE ([facility] ONLY) - Abnormal; Notable for the following components:   Amphetamines, Ur Screen POSITIVE (*)    All other components within normal limits  SARS CORONAVIRUS 2 (HOSPITAL ORDER, PERFORMED IN [facility])  ETHANOL  SALICYLATE LEVEL  CBC   ____________________________________________  EKG    ____________________________________________  RADIOLOGY   ____________________________________________   PROCEDURES  Procedure(s) performed: None  Procedures  Critical Care performed: No  ____________________________________________   INITIAL IMPRESSION / ASSESSMENT AND PLAN / ED COURSE  Pertinent labs & imaging results that were available during my care of the patient were reviewed by me and considered in my medical decision making (see chart for details).   Patient here for evaluation of suicidal ideation.  Had a plan this morning.  Currently calm, compliant involuntary.  He does report suicidal ideation and a plan this morning, but currently agreeable to seeing psychiatry and awaiting evaluation.  At this point I will leave him on a voluntary basis, I discussed this case with our psychiatrist who will see him shortly.  Denies acute medical condition.  Will screen for COVID which resulted negative  ----------------------------------------- 2:21 PM on 12/24/2018 -----------------------------------------  Discussed case with Dr. Jonah Blue psychiatry, advises they plan to admit  the patient to psychiatry services on a voluntary basis at this time.  Patient medically cleared for ongoing psychiatric care and disposition at this time.      ____________________________________________   FINAL CLINICAL IMPRESSION(S) / ED DIAGNOSES  Final diagnoses:  Suicidal ideation        Note:  This document was prepared using Dragon voice recognition software and may include unintentional dictation errors       Delman Kitten, MD 12/24/18 1421

## 2018-12-24 NOTE — ED Notes (Signed)
Pt. Given meal tray and drink upon request. 

## 2018-12-24 NOTE — ED Notes (Signed)
He verbalizes feelings of wanting to harm himself - he states "I took a paracord bracelet and tried to take it apart so that I can make a nuese to hang myself with it but it didn't work out - I told my therapist on the phone this morning and she told me to come here - my mom thinks I am the boy that cried wolf but she just ignores me and I do mean it - when my mom does that it makes me homicidal"   Assessment completed  He was seen by EDP Quale

## 2018-12-24 NOTE — ED Notes (Signed)
First Nurse Note: Pt brought in by legal guardian, who states that she was advised by pts therapist to bring pt to ED for psych evaluation. States that pt was on the phone with his therapist and was making comments that he wanted to harm himself and others. Pt is calm and cooperative at this time.

## 2018-12-24 NOTE — ED Notes (Signed)
BEHAVIORAL HEALTH ROUNDING Patient sleeping: No. Patient alert and oriented: yes Behavior appropriate: Yes.  ; If no, describe:  Nutrition and fluids offered: yes Toileting and hygiene offered: Yes  Sitter present: q15 minute observations  ENVIRONMENTAL ASSESSMENT Potentially harmful objects out of patient reach: Yes.   Personal belongings secured: Yes.   Patient dressed in hospital provided attire only: Yes.   Plastic bags out of patient reach: Yes.   Patient care equipment (cords, cables, call bells, lines, and drains) shortened, removed, or accounted for: Yes.   Equipment and supplies removed from bottom of stretcher: Yes.   Potentially toxic materials out of patient reach: Yes.   Sharps container removed or out of patient reach: Yes.   

## 2018-12-24 NOTE — ED Notes (Signed)
Vol  Pt  Moved  To  [place]
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
## 2018-12-25 DIAGNOSIS — F4325 Adjustment disorder with mixed disturbance of emotions and conduct: Secondary | ICD-10-CM

## 2018-12-25 MED ORDER — ACETAMINOPHEN 325 MG PO TABS
ORAL_TABLET | ORAL | Status: AC
  Filled 2018-12-25: qty 2

## 2018-12-25 MED ORDER — ACETAMINOPHEN 325 MG PO TABS
650.0000 mg | ORAL_TABLET | Freq: Once | ORAL | Status: AC
  Administered 2018-12-25: 650 mg via ORAL

## 2018-12-25 MED ORDER — ONDANSETRON HCL 4 MG PO TABS
4.0000 mg | ORAL_TABLET | Freq: Once | ORAL | Status: AC
  Administered 2018-12-25: 4 mg via ORAL

## 2018-12-25 MED ORDER — TEMAZEPAM 7.5 MG PO CAPS: 7.5000 mg | ORAL_CAPSULE | Freq: Every evening | ORAL | Status: DC | PRN

## 2018-12-25 MED ORDER — LISDEXAMFETAMINE DIMESYLATE 70 MG PO CAPS
70.0000 mg | ORAL_CAPSULE | Freq: Every day | ORAL | Status: DC
  Administered 2018-12-25: 70 mg via ORAL
  Filled 2018-12-25: qty 1

## 2018-12-25 MED ORDER — FLUVOXAMINE MALEATE 50 MG PO TABS
150.0000 mg | ORAL_TABLET | Freq: Every day | ORAL | Status: DC
  Filled 2018-12-25: qty 3

## 2018-12-25 MED ORDER — HYDROXYZINE HCL 25 MG PO TABS: 25.0000 mg | ORAL_TABLET | Freq: Three times a day (TID) | ORAL | Status: DC | PRN

## 2018-12-25 MED ORDER — PRAZOSIN HCL 1 MG PO CAPS
1.0000 mg | ORAL_CAPSULE | Freq: Every day | ORAL | Status: DC
  Administered 2018-12-25: 1 mg via ORAL
  Filled 2018-12-25: qty 1

## 2018-12-25 MED ORDER — ONDANSETRON 4 MG PO TBDP
ORAL_TABLET | ORAL | Status: AC
  Filled 2018-12-25: qty 1

## 2018-12-25 MED ORDER — LURASIDONE HCL 40 MG PO TABS: 80.0000 mg | ORAL_TABLET | Freq: Every day | ORAL | Status: DC

## 2018-12-25 MED ORDER — OXCARBAZEPINE 300 MG PO TABS
600.0000 mg | ORAL_TABLET | Freq: Two times a day (BID) | ORAL | Status: DC
  Administered 2018-12-25: 11:00:00 600 mg via ORAL
  Filled 2018-12-25 (×2): qty 2

## 2018-12-25 MED ORDER — PANTOPRAZOLE SODIUM 20 MG PO TBEC
20.0000 mg | DELAYED_RELEASE_TABLET | Freq: Every day | ORAL | Status: DC
  Administered 2018-12-25: 20 mg via ORAL
  Filled 2018-12-25: qty 1

## 2018-12-25 NOTE — ED Notes (Signed)
Pt states that he doesn't feel anxious or like his heart is racing. MD notified. Pt to follow up with primary care if heart continues to race. Pt states "I think I just need some sleep."

## 2018-12-25 NOTE — ED Notes (Signed)
Meal tray given 

## 2018-12-25 NOTE — ED Notes (Signed)
Dr. Jari Pigg notified of heart rate. EKG performed.

## 2018-12-25 NOTE — ED Notes (Signed)
Pt. Stated he felt nauseated, md notified.

## 2018-12-25 NOTE — ED Provider Notes (Signed)
-----------------------------------------   6:32 AM on 12/25/2018 -----------------------------------------   Blood pressure 135/89, pulse 98, temperature 98.1 F (36.7 C), temperature source Oral, resp. rate 16, height 1.829 m (6'), weight 85.3 kg, SpO2 100 %.  The patient is calm and cooperative at this time.  There have been no acute events since the last update.  Awaiting disposition plan from Behavioral Medicine and/or Social Work team(s).   Hinda Kehr, MD 12/25/18 [phone]

## 2018-12-25 NOTE — Discharge Instructions (Signed)
Adjustment Disorder, Adult °Adjustment disorder is a group of symptoms that can develop after a stressful life event, such as the loss of a job or serious physical illness. The symptoms can affect how you feel, think, and act. They may interfere with your relationships. °Adjustment disorder increases your risk of suicide and substance abuse. If this disorder is not managed early, it can develop into a more serious condition, such as major depressive disorder or post-traumatic stress disorder. °What are the causes? °This condition happens when you have trouble recovering from or coping with a stressful life event. °What increases the risk? °You are more likely to develop this condition if: °· You have had depression or anxiety. °· You are being treated for a long-term (chronic) illness. °· You are being treated for an illness that cannot be cured (terminal illness). °· You have a family history of mental illness. °What are the signs or symptoms? °Symptoms of this condition include: °· Extreme trouble doing daily tasks, such as going to work. °· Sadness, depression, or crying spells. °· Worrying a lot. °· Loss of enjoyment. °· Change in appetite or weight. °· Feelings of loss or hopelessness. °· Thoughts of suicide. °· Anxiety, worry, or nervousness. °· Trouble sleeping. °· Avoiding family and friends. °· Fighting or vandalism. °· Complaining of feeling sick without being ill. °· Feeling dazed or disconnected. °· Nightmares. °· Trouble sleeping. °· Irritability. °· Reckless driving. °· Poor work performance. °· Ignoring bills. °Symptoms of this condition start within three months of the stressful event. They do not last more than six months, unless the stressful circumstances last longer. Normal grieving after the death of a loved one is not a symptom of this condition. °How is this diagnosed? °To diagnose this condition, your health care provider will ask about what has happened in your life and how it has affected  you. He or she may also ask about your medical history and your use of medicines, alcohol, and other substances. Your health care provider may do a physical exam and order lab tests or other studies. You may be referred to a mental health specialist. °How is this treated? °Treatment options for this condition include: °· Counseling or talk therapy. Talk therapy is usually provided by mental health specialists. °· Medicines. Certain medicines may help with depression, anxiety, and sleep. °· Support groups. These offer emotional support, advice, and guidance. They are made up of people who have had similar experiences. °· Observation and time. This is sometimes called "watchful waiting." In this treatment, health care providers monitor your health and behavior without other treatment. Adjustment disorder sometimes gets better on its own with time. °Follow these instructions at home: °· Take over-the-counter and prescription medicines only as told by your health care provider. °· Keep all follow-up visits as told by your health care provider. This is important. °Contact a health care provider if: °· Your symptoms do not improve in six months. °· Your symptoms get worse. °Get help right away if: °· You have serious thoughts about hurting yourself or someone else. °If you ever feel like you may hurt yourself or others, or have thoughts about taking your own life, get help right away. You can go to your nearest emergency department or call: °· Your local emergency services (911 in the [place] °· A suicide crisis helpline, such as the National Suicide Prevention Lifeline at 1-800-273-8255. This is open 24 hours a day. °Summary °· Adjustment disorder is a group of symptoms that can develop   after a stressful life event, such as the loss of a job or serious physical illness. The symptoms can affect how you feel, think, and act. They may interfere with your relationships. °· Symptoms of this condition start within three months  of the stressful event. They do not last more than six months, unless the stressful circumstances last longer. °· Treatment may include talk therapy, medicines, participation in a support group, or observation to see if symptoms improve. °· Contact your health care provider if your symptoms get worse or do not improve in six months. °· If you ever feel like you may hurt yourself or others, or have thoughts about taking your own life, get help right away. °This information is not intended to replace advice given to you by your health care provider. Make sure you discuss any questions you have with your health care provider. °Document Released: 11/12/2005 Document Revised: 02/20/2017 Document Reviewed: 05/09/2016 °Elsevier Patient Education © 2020 [employer] ° °
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
## 2018-12-25 NOTE — ED Notes (Signed)
Legal Guardian spoke to by Roselyn Reef, NP and verbalized understanding of d/c paperwork. Guardian going to drive around [place] medical mall to pick pt up, and therefore is unable to sign for d/c paperwork but understands.

## 2018-12-25 NOTE — ED Notes (Addendum)
When asked if pt had any thoughts of wanting to harm himself or others, pt stated he was having thoughts of "just wanting to hurt others".  Denies person of intent or plan.

## 2018-12-25 NOTE — ED Notes (Signed)
Pt in sinus tach. MD Funke notified and EKG seen. Pt to have some PO fluids and wait until ride to recheck HR.

## 2018-12-25 NOTE — ED Provider Notes (Addendum)
Upon discharge patient noted to have elevated heart rate in the 130s.  EKG showed sinus tachycardia, no ST elevation, no T wave inversion, normal intervals.  I discussed with the nurses about having patient take some p.o. hydration and to give him a little bit of time to reevaluate this fast heart rate however it sounds like his ride is here and he would prefer to go home at this time.  Patient was positive for amphetamines is possible that likely secondary to that.  Patient be given p.o. hydration and reevaluated.  Requesting to leave.  Patient is normal to stay for further work-up of his tachycardia.  Patient understands that he should follow with primary care doctor doctor in 2 days and return to ER if he develops any symptoms.   Vanessa [place], MD 12/25/18 1443    Vanessa Ravenel, MD 12/25/18 [phone]

## 2018-12-25 NOTE — ED Notes (Signed)
Pt. Came to nursing station request medication for headache.  Pt. Indicated tylenol worked best for him.  MD informed, orders issued.

## 2019-01-28 ENCOUNTER — Other Ambulatory Visit: Payer: Self-pay

## 2019-01-28 ENCOUNTER — Encounter: Payer: Self-pay | Admitting: Intensive Care

## 2019-01-28 ENCOUNTER — Emergency Department
Admission: EM | Admit: 2019-01-28 | Discharge: 2019-01-29 | Disposition: A | Discharge status: Other Acute Inpatient Hospital | Payer: Medicaid Other | Attending: Emergency Medicine | Admitting: Emergency Medicine

## 2019-01-28 DIAGNOSIS — J45909 Unspecified asthma, uncomplicated: Secondary | ICD-10-CM | POA: Diagnosis not present

## 2019-01-28 DIAGNOSIS — R45851 Suicidal ideations: Secondary | ICD-10-CM | POA: Diagnosis not present

## 2019-01-28 DIAGNOSIS — F17210 Nicotine dependence, cigarettes, uncomplicated: Secondary | ICD-10-CM | POA: Insufficient documentation

## 2019-01-28 DIAGNOSIS — Z20828 Contact with and (suspected) exposure to other viral communicable diseases: Secondary | ICD-10-CM | POA: Insufficient documentation

## 2019-01-28 DIAGNOSIS — Z79899 Other long term (current) drug therapy: Secondary | ICD-10-CM | POA: Insufficient documentation

## 2019-01-28 DIAGNOSIS — Z23 Encounter for immunization: Secondary | ICD-10-CM | POA: Diagnosis not present

## 2019-01-28 DIAGNOSIS — F332 Major depressive disorder, recurrent severe without psychotic features: Secondary | ICD-10-CM | POA: Insufficient documentation

## 2019-01-28 LAB — CBC
HCT: 45.7 % (ref 39.0–52.0)
Hemoglobin: 16 g/dL (ref 13.0–17.0)
MCH: 31.8 pg (ref 26.0–34.0)
MCHC: 35 g/dL (ref 30.0–36.0)
MCV: 90.9 fL (ref 80.0–100.0)
Platelets: 261 10*3/uL (ref 150–400)
RBC: 5.03 MIL/uL (ref 4.22–5.81)
RDW: 12.2 % (ref 11.5–15.5)
WBC: 6.9 10*3/uL (ref 4.0–10.5)
nRBC: 0 % (ref 0.0–0.2)

## 2019-01-28 LAB — COMPREHENSIVE METABOLIC PANEL
ALT: 36 U/L (ref 0–44)
AST: 30 U/L (ref 15–41)
Albumin: 4.7 g/dL (ref 3.5–5.0)
Alkaline Phosphatase: 66 U/L (ref 38–126)
Anion gap: 10 (ref 5–15)
BUN: 19 mg/dL (ref 6–20)
CO2: 24 mmol/L (ref 22–32)
Calcium: 9.4 mg/dL (ref 8.9–10.3)
Chloride: 106 mmol/L (ref 98–111)
Creatinine, Ser: 1.08 mg/dL (ref 0.61–1.24)
GFR calc Af Amer: >60 mL/min (ref >60)
GFR calc non Af Amer: >60 mL/min (ref >60)
Glucose, Bld: 102 mg/dL — ABNORMAL HIGH (ref 70–99)
Potassium: 3.9 mmol/L (ref 3.5–5.1)
Sodium: 140 mmol/L (ref 135–145)
Total Bilirubin: 0.9 mg/dL (ref 0.3–1.2)
Total Protein: 7.9 g/dL (ref 6.5–8.1)

## 2019-01-28 LAB — URINE DRUG SCREEN, QUALITATIVE (ARMC ONLY)
Amphetamines, Ur Screen: POSITIVE — AB
Barbiturates, Ur Screen: NOT DETECTED
Benzodiazepine, Ur Scrn: NOT DETECTED
Cannabinoid 50 Ng, Ur ~~LOC~~: NOT DETECTED
Cocaine Metabolite,Ur ~~LOC~~: NOT DETECTED
MDMA (Ecstasy)Ur Screen: NOT DETECTED
Methadone Scn, Ur: NOT DETECTED
Opiate, Ur Screen: NOT DETECTED
Phencyclidine (PCP) Ur S: NOT DETECTED
Tricyclic, Ur Screen: NOT DETECTED

## 2019-01-28 LAB — ETHANOL: Alcohol, Ethyl (B): <10 mg/dL (ref <10)

## 2019-01-28 LAB — ACETAMINOPHEN LEVEL: Acetaminophen (Tylenol), Serum: <10 ug/mL — ABNORMAL LOW (ref 10–30)

## 2019-01-28 LAB — SARS CORONAVIRUS 2 (TAT 6-24 HRS): SARS Coronavirus 2: NEGATIVE

## 2019-01-28 LAB — SALICYLATE LEVEL: Salicylate Lvl: <7 mg/dL (ref 2.8–30.0)

## 2019-01-28 MED ORDER — LORAZEPAM 1 MG PO TABS
1.0000 mg | ORAL_TABLET | Freq: Once | ORAL | Status: AC
  Administered 2019-01-28: 1 mg via ORAL
  Filled 2019-01-28: qty 1

## 2019-01-28 MED ORDER — TEMAZEPAM 7.5 MG PO CAPS
7.5000 mg | ORAL_CAPSULE | Freq: Every evening | ORAL | Status: DC | PRN
  Administered 2019-01-28: 7.5 mg via ORAL
  Filled 2019-01-28: qty 1

## 2019-01-28 MED ORDER — ACETAMINOPHEN 325 MG PO TABS
650.0000 mg | ORAL_TABLET | Freq: Four times a day (QID) | ORAL | Status: DC | PRN
  Administered 2019-01-28: 650 mg via ORAL
  Filled 2019-01-28: qty 2

## 2019-01-28 MED ORDER — TETANUS-DIPHTH-ACELL PERTUSSIS 5-2.5-18.5 LF-MCG/0.5 IM SUSP
0.5000 mL | Freq: Once | INTRAMUSCULAR | Status: AC
  Administered 2019-01-28: 0.5 mL via INTRAMUSCULAR
  Filled 2019-01-28: qty 0.5
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
## 2019-01-28 NOTE — ED Notes (Signed)
IVC prior to arrival/ Consult ordered 
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
## 2019-01-28 NOTE — ED Notes (Signed)
Report given to Wendy, RN.

## 2019-01-28 NOTE — ED Notes (Signed)
Nurse talked with patient, and He states that He cut Himself because His Dad and mom were so mad at him for looking at porn, and He just wanted to die, patient said He feels better, but still thinks if He left that He would hurt himself, patient contracts for safety here, Nurse will continue to monitor, q 15 minute checks and camera surveillance for safety.

## 2019-01-29 ENCOUNTER — Inpatient Hospital Stay
Admission: RE | Admit: 2019-01-29 | Discharge: 2019-02-07 | DRG: 885 | Disposition: A | Discharge status: Home / Self Care | Payer: Medicaid Other | Source: Intra-hospital | Attending: Psychiatry | Admitting: Psychiatry

## 2019-01-29 DIAGNOSIS — S61519A Laceration without foreign body of unspecified wrist, initial encounter: Secondary | ICD-10-CM | POA: Diagnosis present

## 2019-01-29 DIAGNOSIS — F17210 Nicotine dependence, cigarettes, uncomplicated: Secondary | ICD-10-CM | POA: Diagnosis present

## 2019-01-29 DIAGNOSIS — Z79899 Other long term (current) drug therapy: Secondary | ICD-10-CM

## 2019-01-29 DIAGNOSIS — R45851 Suicidal ideations: Secondary | ICD-10-CM | POA: Diagnosis present

## 2019-01-29 DIAGNOSIS — F329 Major depressive disorder, single episode, unspecified: Secondary | ICD-10-CM | POA: Diagnosis present

## 2019-01-29 DIAGNOSIS — F319 Bipolar disorder, unspecified: Secondary | ICD-10-CM | POA: Diagnosis present

## 2019-01-29 DIAGNOSIS — F909 Attention-deficit hyperactivity disorder, unspecified type: Secondary | ICD-10-CM | POA: Diagnosis present

## 2019-01-29 DIAGNOSIS — F332 Major depressive disorder, recurrent severe without psychotic features: Secondary | ICD-10-CM

## 2019-01-29 DIAGNOSIS — F639 Impulse disorder, unspecified: Secondary | ICD-10-CM | POA: Diagnosis present

## 2019-01-29 DIAGNOSIS — F4325 Adjustment disorder with mixed disturbance of emotions and conduct: Secondary | ICD-10-CM | POA: Diagnosis present

## 2019-01-29 DIAGNOSIS — R45850 Homicidal ideations: Secondary | ICD-10-CM | POA: Diagnosis present

## 2019-01-29 DIAGNOSIS — X789XXA Intentional self-harm by unspecified sharp object, initial encounter: Secondary | ICD-10-CM | POA: Diagnosis present

## 2019-01-29 DIAGNOSIS — G4700 Insomnia, unspecified: Secondary | ICD-10-CM | POA: Diagnosis present

## 2019-01-29 LAB — LIPID PANEL
Cholesterol: 180 mg/dL (ref 0–200)
HDL: 46 mg/dL (ref >40)
LDL Cholesterol: 121 mg/dL — ABNORMAL HIGH (ref 0–99)
Total CHOL/HDL Ratio: 3.9 RATIO
Triglycerides: 64 mg/dL (ref <150)
VLDL: 13 mg/dL (ref 0–40)

## 2019-01-29 LAB — HEPATIC FUNCTION PANEL
ALT: 29 U/L (ref 0–44)
AST: 23 U/L (ref 15–41)
Albumin: 4.4 g/dL (ref 3.5–5.0)
Alkaline Phosphatase: 61 U/L (ref 38–126)
Bilirubin, Direct: <0.1 mg/dL (ref 0.0–0.2)
Total Bilirubin: 0.9 mg/dL (ref 0.3–1.2)
Total Protein: 7.3 g/dL (ref 6.5–8.1)

## 2019-01-29 LAB — TSH: TSH: 1.712 u[IU]/mL (ref 0.350–4.500)

## 2019-01-29 MED ORDER — FLUVOXAMINE MALEATE 50 MG PO TABS
150.0000 mg | ORAL_TABLET | Freq: Every day | ORAL | Status: DC
  Administered 2019-01-29 – 2019-02-06 (×9): 150 mg via ORAL
  Filled 2019-01-29 (×9): qty 3

## 2019-01-29 MED ORDER — PRAZOSIN HCL 1 MG PO CAPS
1.0000 mg | ORAL_CAPSULE | Freq: Every day | ORAL | Status: DC
  Administered 2019-01-29 – 2019-02-07 (×10): 1 mg via ORAL
  Filled 2019-01-29 (×10): qty 1

## 2019-01-29 MED ORDER — ACETAMINOPHEN 325 MG PO TABS
650.0000 mg | ORAL_TABLET | Freq: Four times a day (QID) | ORAL | Status: DC | PRN
  Administered 2019-01-30 – 2019-02-07 (×8): 650 mg via ORAL
  Filled 2019-01-29 (×9): qty 2

## 2019-01-29 MED ORDER — LURASIDONE HCL 80 MG PO TABS
80.0000 mg | ORAL_TABLET | Freq: Every day | ORAL | Status: DC
  Administered 2019-01-29 – 2019-02-07 (×10): 80 mg via ORAL
  Filled 2019-01-29 (×10): qty 1

## 2019-01-29 MED ORDER — ALUM & MAG HYDROXIDE-SIMETH 200-200-20 MG/5ML PO SUSP: 30.0000 mL | ORAL | Status: DC | PRN

## 2019-01-29 MED ORDER — MAGNESIUM HYDROXIDE 400 MG/5ML PO SUSP: 30.0000 mL | Freq: Every day | ORAL | Status: DC | PRN

## 2019-01-29 MED ORDER — PANTOPRAZOLE SODIUM 20 MG PO TBEC
20.0000 mg | DELAYED_RELEASE_TABLET | Freq: Every day | ORAL | Status: DC
  Administered 2019-01-29 – 2019-02-07 (×10): 20 mg via ORAL
  Filled 2019-01-29 (×11): qty 1

## 2019-01-29 MED ORDER — OXCARBAZEPINE 300 MG PO TABS
600.0000 mg | ORAL_TABLET | Freq: Two times a day (BID) | ORAL | Status: DC
  Administered 2019-01-29 – 2019-02-07 (×19): 600 mg via ORAL
  Filled 2019-01-29 (×19): qty 2

## 2019-01-29 MED ORDER — ALBUTEROL SULFATE HFA 108 (90 BASE) MCG/ACT IN AERS
1.0000 | INHALATION_SPRAY | RESPIRATORY_TRACT | Status: DC | PRN
  Administered 2019-02-05: 16:00:00 1 via RESPIRATORY_TRACT
  Filled 2019-01-29: qty 6.7

## 2019-01-29 MED ORDER — NICOTINE 14 MG/24HR TD PT24
14.0000 mg | MEDICATED_PATCH | Freq: Every day | TRANSDERMAL | Status: DC
  Administered 2019-01-29 – 2019-02-07 (×10): 14 mg via TRANSDERMAL
  Filled 2019-01-29 (×10): qty 1

## 2019-01-29 MED ORDER — BACITRACIN-NEOMYCIN-POLYMYXIN 400-5-5000 EX OINT
TOPICAL_OINTMENT | Freq: Two times a day (BID) | CUTANEOUS | Status: AC
  Administered 2019-01-29 – 2019-01-31 (×4): 1 via TOPICAL
  Filled 2019-01-29 (×4): qty 1

## 2019-01-29 MED ORDER — INFLUENZA VAC SPLIT QUAD 0.5 ML IM SUSY: 0.5000 mL | PREFILLED_SYRINGE | INTRAMUSCULAR | Status: DC

## 2019-01-29 MED ORDER — AMPHETAMINE-DEXTROAMPHETAMINE 5 MG PO TABS
20.0000 mg | ORAL_TABLET | Freq: Two times a day (BID) | ORAL | Status: DC
  Administered 2019-01-29 – 2019-02-07 (×19): 20 mg via ORAL
  Filled 2019-01-29 (×19): qty 4

## 2019-01-29 MED ORDER — TEMAZEPAM 7.5 MG PO CAPS
7.5000 mg | ORAL_CAPSULE | Freq: Every evening | ORAL | Status: DC | PRN
  Administered 2019-01-31 – 2019-02-06 (×7): 7.5 mg via ORAL
  Filled 2019-01-29 (×7): qty 1

## 2019-01-29 NOTE — Tx Team (Signed)
Interdisciplinary Treatment and Diagnostic Plan Update  01/29/2019 Time of Session: 9:30AM Joel Arroyo MRN: [id number]  Principal Diagnosis: <principal problem not specified>  Secondary Diagnoses: Active Problems:   Major depressive disorder   Current Medications:  Current Facility-Administered Medications  Medication Dose Route Frequency Provider Last Rate Last Dose  . acetaminophen (TYLENOL) tablet 650 mg  650 mg Oral Q6H PRN Cristofano, Paul A, MD      . albuterol (VENTOLIN HFA) 108 (90 Base) MCG/ACT inhaler 1 puff  1 puff Inhalation Q4H PRN Cristofano, Dorene Ar, MD      . alum & mag hydroxide-simeth (MAALOX/MYLANTA) 200-200-20 MG/5ML suspension 30 mL  30 mL Oral Q4H PRN Cristofano, Dorene Ar, MD      . Derrill Memo ON 01/30/2019] influenza vac split quadrivalent PF (FLUARIX) injection 0.5 mL  0.5 mL Intramuscular Tomorrow-1000 Clapacs, John T, MD      . magnesium hydroxide (MILK OF MAGNESIA) suspension 30 mL  30 mL Oral Daily PRN Cristofano, Paul A, MD      . pantoprazole (PROTONIX) EC tablet 20 mg  20 mg Oral Daily Cristofano, Dorene Ar, MD   20 mg at 01/29/19 0842  . prazosin (MINIPRESS) capsule 1 mg  1 mg Oral Daily Cristofano, Dorene Ar, MD   1 mg at 01/29/19 0842  . temazepam (RESTORIL) capsule 7.5 mg  7.5 mg Oral QHS PRN Cristofano, Dorene Ar, MD       PTA Medications: Medications Prior to Admission  Medication Sig Dispense Refill Last Dose  . albuterol (VENTOLIN HFA) 108 (90 Base) MCG/ACT inhaler Inhale 1 puff into the lungs every 4 (four) hours as needed (SOB). 6.7 g 1   . fluvoxaMINE (LUVOX) 50 MG tablet Take 3 tablets (150 mg total) by mouth at bedtime. 90 tablet 1   . hydrOXYzine (VISTARIL) 25 MG capsule Take 25 mg by mouth 3 (three) times daily.     Marland Kitchen lurasidone (LATUDA) 80 MG TABS tablet Take 1 tablet (80 mg total) by mouth daily with supper. 30 tablet 1   . oxcarbazepine (TRILEPTAL) 600 MG tablet Take 1 tablet (600 mg total) by mouth 2 (two) times daily. (Patient not taking: Reported  on 01/28/2019) 60 tablet 1   . pantoprazole (PROTONIX) 20 MG tablet Take 20 mg by mouth daily.     . prazosin (MINIPRESS) 1 MG capsule Take 1 mg by mouth daily.     . temazepam (RESTORIL) 7.5 MG capsule Take 1 capsule (7.5 mg total) by mouth at bedtime as needed for sleep. (Patient not taking: Reported on 01/28/2019) 30 capsule 0   . VYVANSE 70 MG capsule Take 70 mg by mouth daily.        Patient Stressors: Marital or family conflict  Patient Strengths: Ability for insight Active sense of humor Communication skills  Treatment Modalities: Medication Management, Group therapy, Case management,  1 to 1 session with clinician, Psychoeducation, Recreational therapy.   Physician Treatment Plan for Primary Diagnosis: <principal problem not specified> Long Term Goal(s):     Short Term Goals:    Medication Management: Evaluate patient's response, side effects, and tolerance of medication regimen.  Therapeutic Interventions: 1 to 1 sessions, Unit Group sessions and Medication administration.  Evaluation of Outcomes: Progressing  Physician Treatment Plan for Secondary Diagnosis: Active Problems:   Major depressive disorder  Long Term Goal(s):     Short Term Goals:       Medication Management: Evaluate patient's response, side effects, and tolerance of medication regimen.  Therapeutic Interventions: 1  to 1 sessions, Unit Group sessions and Medication administration.  Evaluation of Outcomes: Progressing   RN Treatment Plan for Primary Diagnosis: <principal problem not specified> Long Term Goal(s): Knowledge of disease and therapeutic regimen to maintain health will improve  Short Term Goals: Ability to remain free from injury will improve, Ability to verbalize frustration and anger appropriately will improve, Ability to demonstrate self-control, Ability to participate in decision making will improve, Ability to verbalize feelings will improve, Ability to disclose and discuss suicidal  ideas, Ability to identify and develop effective coping behaviors will improve and Compliance with prescribed medications will improve  Medication Management: RN will administer medications as ordered by provider, will assess and evaluate patient's response and provide education to patient for prescribed medication. RN will report any adverse and/or side effects to prescribing provider.  Therapeutic Interventions: 1 on 1 counseling sessions, Psychoeducation, Medication administration, Evaluate responses to treatment, Monitor vital signs and CBGs as ordered, Perform/monitor CIWA, COWS, AIMS and Fall Risk screenings as ordered, Perform wound care treatments as ordered.  Evaluation of Outcomes: Progressing   LCSW Treatment Plan for Primary Diagnosis: <principal problem not specified> Long Term Goal(s): Safe transition to appropriate next level of care at discharge, Engage patient in therapeutic group addressing interpersonal concerns.  Short Term Goals: Engage patient in aftercare planning with referrals and resources, Increase social support, Increase ability to appropriately verbalize feelings, Increase emotional regulation, Facilitate acceptance of mental health diagnosis and concerns, Facilitate patient progression through stages of change regarding substance use diagnoses and concerns, Identify triggers associated with mental health/substance abuse issues and Increase skills for wellness and recovery  Therapeutic Interventions: Assess for all discharge needs, 1 to 1 time with Social worker, Explore available resources and support systems, Assess for adequacy in community support network, Educate family and significant other(s) on suicide prevention, Complete Psychosocial Assessment, Interpersonal group therapy.  Evaluation of Outcomes: Progressing   Progress in Treatment: Attending groups: Yes. Participating in groups: Yes. Taking medication as prescribed: Yes. Toleration medication:  Yes. Family/Significant other contact made: No, will contact:  [name] Stanley/mother and legal guardian at [phone] Patient understands diagnosis: Yes. Discussing patient identified problems/goals with staff: Yes. Medical problems stabilized or resolved: Yes. Denies suicidal/homicidal ideation: Patient able to contract for safety on unit. Issues/concerns per patient self-inventory: No. Other: NA  New problem(s) identified: No, Describe:  None  New Short Term/Long Term Goal(s):  Engage patient in aftercare planning with referrals and resources, Increase social support, Increase ability to appropriately verbalize feelings, Increase emotional regulation  Patient Goals:  "to get better and stop cutting myself"  Discharge Plan or Barriers: Patient has a legal guardian. However, he does not want to return home. Placement options need to be considered and discussed with his guardian.  Reason for Continuation of Hospitalization: Depression Homicidal ideation Suicidal ideation  Estimated Length of Stay:  3-5 days  Attendees: Patient:  Joel Arroyo 01/29/2019 11:22 AM  Physician: Dr. Toni Amend 01/29/2019 11:22 AM  Nursing: Hulan Amato, RN 01/29/2019 11:22 AM  RN Care Manager: 01/29/2019 11:22 AM  Social Worker: Roselyn Bering, LCSW 01/29/2019 11:22 AM  Recreational Therapist:  01/29/2019 11:22 AM  Other:  01/29/2019 11:22 AM  Other:  01/29/2019 11:22 AM  Other: 01/29/2019 11:22 AM    Scribe for Treatment Team: Roselyn Bering, MSW, LCSW Clinical Social Work 01/29/2019 11:22 AM
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
## 2019-01-29 NOTE — ED Notes (Signed)
TTS has spoken with the pts legal guardianStanley,Tracy @ [phone]. She has been informed of the pts plan of care.

## 2019-01-29 NOTE — ED Notes (Signed)
Patient is to be admitted to [facility] BMU by Dr. Ainsley Spinner.  Attending Physician will be Dr. Weber Cooks.   Patient has been assigned to room 305, by [place] Nurse.   Intake Paper Work has been signed and placed on patient chart.  ER staff is aware of the admission:
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
## 2019-01-29 NOTE — Progress Notes (Signed)
D: Patient stated slept good last night .Stated appetite fair and energy level  normal. Stated concentration good . Stated on Depression scale 4 , hopeless  4 and anxiety 5 .( low 0-10 high)  Patient verbalize understanding of Educational  concerns. Encourage patient participation with unit programing , encourage verbalization of feeling  in therapy groups. Patient knowledgeable of information received  concerning medication.   Voice no concerns around sleep and wake cycle .  Patient working on  Estate agent , and anxiety. Patient  denies suicidal ideations Emotional and mental status improved . Voice of no safety concerns .  No concerns around safety.Denies suicidal  homicidal ideations  .  No auditory hallucinations  No pain concerns . Appropriate ADL'S. Interacting with peers and staff.  Patient voice of goal  today to stop hurting himself .  A: Encourage patient participation with unit programming . Instruction  Given on  Medication , verbalize understanding.  R: Voice no other concerns. Staff continue to monitor

## 2019-01-29 NOTE — [facility] Group Notes (Addendum)
LCSW Group Therapy 01/29/2019 1:00pm  Type of Therapy and Topic:  Group Therapy:  Setting Goals  Participation Level:  Active  Description of Group: In this process group, patients discussed using strengths to work toward goals and address challenges.  Patients identified two positive things about themselves and one goal they were working on.  Patients were given the opportunity to share openly and support each other's plan for self-empowerment.  The group discussed the value of gratitude and were encouraged to have a daily reflection of positive characteristics or circumstances.  Patients were encouraged to identify a plan to utilize their strengths to work on current challenges and goals.  Therapeutic Goals 1. Patient will verbalize personal strengths/positive qualities and relate how these can assist with achieving desired personal goals 2. Patients will verbalize affirmation of peers plans for personal change and goal setting 3. Patients will explore the value of gratitude and positive focus as related to successful achievement of goals 4. Patients will verbalize a plan for regular reinforcement of personal positive qualities and circumstances.  Summary of Patient Progress: Patient identified the definition of goals.Patients was given the opportunity to share openly and support other group members' plan for self-empowerment. Patient verbalized personal strength and how they relate to achieving the desired goal. Patient was able to identify positive goals to work towards when she returns home. Patient participated in group. During check-ins, patient identified feeling "blue because of everything that's going on in my life right now. I am also confused about what I want for myself." Patient identified that writing music makes him happy. He participated in discussion of SMART goals and identified goal he wanted to set.  Patients were given a SMART goal worksheet for them to write down their goals  and verify that their goal is SMART.    Therapeutic Modalities Cognitive Behavioral Therapy Motivational Interviewing    Netta Neat, [place]

## 2019-01-29 NOTE — BHH Suicide Risk Assessment (Signed)
[facility] Admission Suicide Risk Assessment   Nursing information obtained from:  Patient Demographic factors:  Male Current Mental Status:  Plan includes specific time, place, or method Loss Factors:  Financial problems / change in socioeconomic status Historical Factors:  Prior suicide attempts Risk Reduction Factors:  Religious beliefs about death  Total Time spent with patient: 1 hour Principal Problem: Major depressive disorder Diagnosis:  Principal Problem:   Major depressive disorder Active Problems:   ADHD (attention deficit hyperactivity disorder)   Suicidal ideation   Homicidal ideation   Self-inflicted laceration of wrist (HCC)   Adjustment disorder with mixed disturbance of emotions and conduct  Subjective Data: Patient seen chart reviewed.  Young man with a history of recurrent mood and behavior problems brought in under IVC after cutting himself on the wrists and threatening to hang himself.  Patient tells me today he cannot stand living with his parents anymore.  He really wanted to die.  He is still ambivalent about it but is not acting out on it or planning to act out on it in the hospital.  No psychotic symptoms endorsed.  Denies having been using any substances recently.  Continued Clinical Symptoms:  Alcohol Use Disorder Identification Test Final Score (AUDIT): 6 The "Alcohol Use Disorders Identification Test", Guidelines for Use in Primary Care, Second Edition.  World Pharmacologist [facility]). Score between 0-7:  no or low risk or alcohol related problems. Score between 8-15:  moderate risk of alcohol related problems. Score between 16-19:  high risk of alcohol related problems. Score 20 or above:  warrants further diagnostic evaluation for alcohol dependence and treatment.   CLINICAL FACTORS:   Depression:   Impulsivity Personality Disorders:   Comorbid depression   Musculoskeletal: Strength & Muscle Tone: within normal limits Gait & Station: normal Patient  leans: N/A  Psychiatric Specialty Exam: Physical Exam  Nursing note and vitals reviewed. Constitutional: He appears well-developed and well-nourished.  HENT:  Head: Normocephalic and atraumatic.  Eyes: Pupils are equal, round, and reactive to light. Conjunctivae are normal.  Neck: Normal range of motion.  Cardiovascular: Regular rhythm and normal heart sounds.  Respiratory: Effort normal. No respiratory distress.  GI: Soft.  Musculoskeletal: Normal range of motion.  Neurological: He is alert.  Skin: Skin is warm and dry.     Psychiatric: His speech is normal. His affect is blunt. He is not agitated and not aggressive. Thought content is not paranoid. Cognition and memory are normal. He expresses impulsivity. He expresses suicidal ideation. He expresses no homicidal ideation. He expresses no suicidal plans.    Review of Systems  Constitutional: Negative.   HENT: Negative.   Eyes: Negative.   Respiratory: Negative.   Cardiovascular: Negative.   Gastrointestinal: Negative.   Musculoskeletal: Negative.   Skin: Negative.   Neurological: Negative.   Psychiatric/Behavioral: Positive for depression and suicidal ideas. Negative for hallucinations, memory loss and substance abuse. The patient is nervous/anxious. The patient does not have insomnia.     Blood pressure 111/75, pulse 67, temperature 97.6 F (36.4 C), temperature source Oral, resp. rate 17, height 6' (1.829 m), weight 86.6 kg, SpO2 97 %.Body mass index is 25.9 kg/m.  General Appearance: Casual  Eye Contact:  Good  Speech:  Normal Rate  Volume:  Decreased  Mood:  Depressed  Affect:  Congruent  Thought Process:  Coherent  Orientation:  Full (Time, Place, and Person)  Thought Content:  Logical  Suicidal Thoughts:  Yes.  without intent/plan  Homicidal Thoughts:  No  Memory:  Immediate;   Fair Recent;   Fair Remote;   Fair  Judgement:  Impaired  Insight:  Shallow  Psychomotor Activity:  Normal  Concentration:   Concentration: Fair  Recall:  [employer] of Knowledge:  Fair  Language:  Fair  Akathisia:  No  Handed:  Right  AIMS (if indicated):     Assets:  Desire for Improvement Housing Physical Health Resilience Social Support  ADL's:  Intact  Cognition:  WNL  Sleep:  Number of Hours: 3      COGNITIVE FEATURES THAT CONTRIBUTE TO RISK:  Thought constriction (tunnel vision)    SUICIDE RISK:   Mild:  Suicidal ideation of limited frequency, intensity, duration, and specificity.  There are no identifiable plans, no associated intent, mild dysphoria and related symptoms, good self-control (both objective and subjective assessment), few other risk factors, and identifiable protective factors, including available and accessible social support.  PLAN OF CARE: Continue 15-minute checks.  Restart psychiatric medicines as used previously.  Engage in supportive and educational therapy.  Include patient in group and individual therapy and assessment.  Reassess suicidality continuously before making an appropriate discharge plan  I certify that inpatient services furnished can reasonably be expected to improve the patient's condition.   Mordecai Rasmussen, MD 01/29/2019, 11:45 AM

## 2019-01-29 NOTE — Plan of Care (Signed)
Patient verbalize understanding of Educational  concerns. Encourage patient participation with unit programing , encourage verbalization of feeling  in therapy groups. Patient knowledgeable of information received  concerning medication.   Voice no concerns around sleep and wake cycle .  Patient working on  Estate agent , and anxiety. Patient  denies suicidal ideations Emotional and mental status improved . Voice of no safety concerns .  No concerns around safety.   Problem: Self-Concept: Goal: Will verbalize positive feelings about self Outcome: Progressing Goal: Level of anxiety will decrease Outcome: Progressing   Problem: Safety: Goal: Ability to disclose and discuss suicidal ideas will improve Outcome: Progressing Goal: Ability to identify and utilize support systems that promote safety will improve Outcome: Progressing   Problem: Role Relationship: Goal: Will demonstrate positive changes in social behaviors and relationships Outcome: Progressing   Problem: Health Behavior/Discharge Planning: Goal: Ability to make decisions will improve Outcome: Progressing Goal: Compliance with therapeutic regimen will improve Outcome: Progressing   Problem: Coping: Goal: Coping ability will improve Outcome: Progressing Goal: Will verbalize feelings Outcome: Progressing   Problem: Activity: Goal: Interest or engagement in leisure activities will improve Outcome: Progressing Goal: Imbalance in normal sleep/wake cycle will improve Outcome: Progressing   Problem: Safety: Goal: Periods of time without injury will increase Outcome: Progressing   Problem: Health Behavior/Discharge Planning: Goal: Identification of resources available to assist in meeting health care needs will improve Outcome: Progressing Goal: Compliance with treatment plan for underlying cause of condition will improve Outcome: Progressing   Problem: Coping: Goal: Ability to verbalize frustrations and anger  appropriately will improve Outcome: Progressing   Problem: Activity: Goal: Interest or engagement in activities will improve Outcome: Progressing Goal: Sleeping patterns will improve Outcome: Progressing   Problem: Education: Goal: Knowledge of [place] [facility] Education information/materials will improve Outcome: Progressing Goal: Emotional status will improve Outcome: Progressing Goal: Mental status will improve Outcome: Progressing Goal: Verbalization of understanding the information provided will improve Outcome: Progressing

## 2019-01-29 NOTE — Tx Team (Signed)
Initial Treatment Plan 01/29/2019 4:03 AM Joel Arroyo XBJ:[id number]    PATIENT STRESSORS: Marital or family conflict   PATIENT STRENGTHS: Ability for insight Active sense of humor Communication skills   PATIENT IDENTIFIED PROBLEMS:   suicide  depression                 DISCHARGE CRITERIA:  Ability to meet basic life and health needs Adequate post-discharge living arrangements Improved stabilization in mood, thinking, and/or behavior Medical problems require only outpatient monitoring Motivation to continue treatment in a less acute level of care Need for constant or close observation no longer present Reduction of life-threatening or endangering symptoms to within safe limits Safe-care adequate arrangements made Verbal commitment to aftercare and medication compliance  PRELIMINARY DISCHARGE PLAN: Outpatient therapy Return to previous living arrangement  PATIENT/FAMILY INVOLVEMENT: This treatment plan has been presented to and reviewed with the patient, Joel Arroyo, and/or family member.  The patient and family have been given the opportunity to ask questions and make suggestions.  Libby Maw, RN 01/29/2019, 4:03 AM

## 2019-01-30 NOTE — [facility] Group Notes (Signed)
LCSW Group Therapy Note 01/30/2019 1:15pm  Type of Therapy and Topic: Group Therapy: Feelings Around Returning Home & Establishing a Supportive Framework and Supporting Oneself When Supports Not Available  Participation Level: Active  Description of Group:  Patients first processed thoughts and feelings about upcoming discharge. These included fears of upcoming changes, lack of change, new living environments, judgements and expectations from others and overall stigma of mental health issues. The group then discussed the definition of a supportive framework, what that looks and feels like, and how do to discern it from an unhealthy non-supportive network. The group identified different types of supports as well as what to do when your family/friends are less than helpful or unavailable  Therapeutic Goals  1. Patient will identify one healthy supportive network that they can use at discharge. 2. Patient will identify one factor of a supportive framework and how to tell it from an unhealthy network. 3. Patient able to identify one coping skill to use when they do not have positive supports from others. 4. Patient will demonstrate ability to communicate their needs through discussion and/or role plays.  Summary of Patient Progress:  The patient reported he feels "alright." Pt engaged during group session. As patients processed their anxiety about discharge and described healthy supports patient shared he is not ready to be discharge.  Patients identified at least one self-care tool they were willing to use after discharge.   Therapeutic Modalities Cognitive Behavioral Therapy Motivational Interviewing   Alvena Kiernan  CUEBAS-COLON, LCSW 01/30/2019 11:10 AM

## 2019-01-30 NOTE — Progress Notes (Signed)
This Probation officer spoke with Joel Arroyo, patient's Legal Guardian, and she stated that she spoke with the MD and told him that she would help patient find placement being that he does not want to go back home. However, she has changed her mind and stated to this writer that she is not going to let patient come to her until he finds placement. This Probation officer informed MD, and she also stated that if the social workers needed to call her they could.
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
## 2019-01-30 NOTE — Plan of Care (Signed)
  Problem: Education: Goal: Knowledge of [place] [facility] Education information/materials will improve Outcome: Progressing Goal: Emotional status will improve Outcome: Progressing Goal: Mental status will improve Outcome: Progressing Goal: Verbalization of understanding the information provided will improve Outcome: Progressing  D: Patient has been isolative to room and self. Sleeping most of the shift. Calm and cooperative. Mood is pleasant. Affect appropriate. Denies SI, HI and AVH. A: Continue to monitor for safety R: Safety maintained.

## 2019-01-30 NOTE — Progress Notes (Signed)
D: Patient has been isolative to room and self. Sleeping most of the shift. Calm and cooperative. Mood is pleasant. Affect appropriate. Denies SI, HI and AVH. A: Continue to monitor for safety R: Safety maintained.

## 2019-01-30 NOTE — Progress Notes (Signed)
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  Patient seen today chart reviewed.  Patient has no complaints.  His affect is euthymic.  He denies any suicidal thought.  He continues to repeat that he does not want to go back to his parents house.  I spoke with his stepmother who is his guardian.  She reviewed with me the events that led to him coming in the hospital and her description of it is significantly different than his and makes it much more clear that his behavior was unacceptable at home.  She has not spoken to him here but says that she was willing to work with [employer] or what ever authorities to try and get him into a place to live if he refuses to come back.  Does not rule out taking him back home either. Principal Problem: Major depressive disorder Diagnosis: Principal Problem:   Major depressive disorder Active Problems:   ADHD (attention deficit hyperactivity disorder)   Suicidal ideation   Homicidal ideation   Self-inflicted laceration of wrist (HCC)   Adjustment disorder with mixed disturbance of emotions and conduct  Total Time spent with patient: 30 minutes  Past Psychiatric History: Past history of multiple episodes of this sort of thing suicidal threats minor self injury  Past Medical History:  Past Medical History:  Diagnosis Date  . Anxiety   . Asthma   . Bipolar 1 disorder (HCC)   . Depression   . PTSD (post-traumatic stress disorder)   . Seizures (HCC)     Past Surgical History:  Procedure Laterality Date  . HERNIA REPAIR     Family History:  Family History  Problem Relation Age of Onset  . Kidney cancer Neg Hx   . Kidney disease Neg Hx   . Prostate cancer Neg Hx    Family Psychiatric  History: See previous Social History:  Social History   Substance and Sexual Activity  Alcohol Use No     Social  History   Substance and Sexual Activity  Drug Use No    Social History   Socioeconomic History  . Marital status: Single    Spouse name: Not on file  . Number of children: Not on file  . Years of education: Not on file  . Highest education level: Not on file  Occupational History  . Not on file  Social Needs  . Financial resource strain: Not on file  . Food insecurity    Worry: Not on file    Inability: Not on file  . Transportation needs    Medical: Not on file    Non-medical: Not on file  Tobacco Use  . Smoking status: Current Every Day Smoker    Packs/day: 0.50    Types: Cigarettes  . Smokeless tobacco: Never Used  Substance and Sexual Activity  . Alcohol use: No  . Drug use: No  . Sexual activity: Not on file  Lifestyle  . Physical activity    Days per week: Not on file    Minutes per session: Not on file  . Stress: Not on file  Relationships  . Social Musician on phone: Not on file    Gets together: Not on file    Attends religious service: Not on file    Active member of club or organization: Not on file    Attends meetings  of clubs or organizations: Not on file    Relationship status: Not on file  Other Topics Concern  . Not on file  Social History Narrative  . Not on file   Additional Social History:                         Sleep: Fair  Appetite:  Fair  Current Medications: Current Facility-Administered Medications  Medication Dose Route Frequency Provider Last Rate Last Dose  . acetaminophen (TYLENOL) tablet 650 mg  650 mg Oral Q6H PRN Cristofano, Paul A, MD      . albuterol (VENTOLIN HFA) 108 (90 Base) MCG/ACT inhaler 1 puff  1 puff Inhalation Q4H PRN Cristofano, Worthy RancherPaul A, MD      . alum & mag hydroxide-simeth (MAALOX/MYLANTA) 200-200-20 MG/5ML suspension 30 mL  30 mL Oral Q4H PRN Cristofano, Paul A, MD      . amphetamine-dextroamphetamine (ADDERALL) tablet 20 mg  20 mg Oral BID WC Dalicia Kisner, Jackquline DenmarkJohn T, MD   20 mg at 01/30/19  0818  . fluvoxaMINE (LUVOX) tablet 150 mg  150 mg Oral QHS Mliss Wedin, Jackquline DenmarkJohn T, MD   150 mg at 01/29/19 2134  . influenza vac split quadrivalent PF (FLUARIX) injection 0.5 mL  0.5 mL Intramuscular Tomorrow-1000 Alyssandra Hulsebus T, MD      . lurasidone (LATUDA) tablet 80 mg  80 mg Oral Q supper Eliodoro Gullett, Jackquline DenmarkJohn T, MD   80 mg at 01/29/19 1701  . magnesium hydroxide (MILK OF MAGNESIA) suspension 30 mL  30 mL Oral Daily PRN Cristofano, Worthy RancherPaul A, MD      . neomycin-bacitracin-polymyxin (NEOSPORIN) ointment packet   Topical BID Len Azeez, Jackquline DenmarkJohn T, MD   1 application at 01/30/19 0818  . nicotine (NICODERM CQ - dosed in mg/24 hours) patch 14 mg  14 mg Transdermal Daily Rakeen Gaillard, Jackquline DenmarkJohn T, MD   14 mg at 01/30/19 0819  . Oxcarbazepine (TRILEPTAL) tablet 600 mg  600 mg Oral BID Mahamed Zalewski, Jackquline DenmarkJohn T, MD   600 mg at 01/30/19 0818  . pantoprazole (PROTONIX) EC tablet 20 mg  20 mg Oral Daily Cristofano, Worthy RancherPaul A, MD   20 mg at 01/30/19 0818  . prazosin (MINIPRESS) capsule 1 mg  1 mg Oral Daily Cristofano, Worthy RancherPaul A, MD   1 mg at 01/30/19 0818  . temazepam (RESTORIL) capsule 7.5 mg  7.5 mg Oral QHS PRN Cristofano, Worthy RancherPaul A, MD        Lab Results:  Results for orders placed or performed during the hospital encounter of 01/29/19 (from the past 48 hour(s))  Hepatic function panel     Status: None   Collection Time: 01/29/19  6:43 AM  Result Value Ref Range   Total Protein 7.3 6.5 - 8.1 g/dL   Albumin 4.4 3.5 - 5.0 g/dL   AST 23 15 - 41 U/L   ALT 29 0 - 44 U/L   Alkaline Phosphatase 61 38 - 126 U/L   Total Bilirubin 0.9 0.3 - 1.2 mg/dL   Bilirubin, Direct <1.4<0.1 0.0 - 0.2 mg/dL   Indirect Bilirubin NOT CALCULATED 0.3 - 0.9 mg/dL    Comment: Performed at [facility], [address] [place], [place] [address]  Lipid panel     Status: Abnormal   Collection Time: 01/29/19  6:43 AM  Result Value Ref Range   Cholesterol 180 0 - 200 mg/dL   Triglycerides 64 <621<150 mg/dL   HDL 46 >30>40 mg/dL   Total CHOL/HDL Ratio 3.9 RATIO  VLDL 13  0 - 40 mg/dL   LDL Cholesterol 960 (H) 0 - 99 mg/dL    Comment:        Total Cholesterol/HDL:CHD Risk Coronary Heart Disease Risk Table                     Men   Women  1/2 Average Risk   3.4   3.3  Average Risk       5.0   4.4  2 X Average Risk   9.6   7.1  3 X Average Risk  23.4   11.0        Use the calculated Patient Ratio above and the CHD Risk Table to determine the patient's CHD Risk.        ATP III CLASSIFICATION (LDL):  <100     mg/dL   Optimal  454-098  mg/dL   Near or Above                    Optimal  130-159  mg/dL   Borderline  119-147  mg/dL   High  >829     mg/dL   Very High Performed at [facility], [address] [place], [place] [address]   TSH     Status: None   Collection Time: 01/29/19  6:43 AM  Result Value Ref Range   TSH 1.712 0.350 - 4.500 uIU/mL    Comment: Performed by a 3rd Generation assay with a functional sensitivity of <=0.01 uIU/mL. Performed at [facility], [address] [place], [place] [address]     Blood Alcohol level:  Lab Results  Component Value Date   [facility] <10 01/28/2019   ETH <10 12/24/2018    Metabolic Disorder Labs: Lab Results  Component Value Date   HGBA1C 5.3 09/05/2016   MPG 105 09/05/2016   No results found for: PROLACTIN Lab Results  Component Value Date   CHOL 180 01/29/2019   TRIG 64 01/29/2019   HDL 46 01/29/2019   CHOLHDL 3.9 01/29/2019   VLDL 13 01/29/2019   LDLCALC 121 (H) 01/29/2019    Physical Findings: AIMS:  , ,  ,  ,    CIWA:    COWS:     Musculoskeletal: Strength & Muscle Tone: within normal limits Gait & Station: normal Patient leans: N/A  Psychiatric Specialty Exam: Physical Exam  Nursing note and vitals reviewed. Constitutional: He appears well-developed and well-nourished.  HENT:  Head: Normocephalic and atraumatic.  Eyes: Pupils are equal, round, and reactive to light. Conjunctivae are normal.  Neck: Normal range of motion.  Cardiovascular: Regular  rhythm and normal heart sounds.  Respiratory: Effort normal. No respiratory distress.  GI: Soft.  Musculoskeletal: Normal range of motion.  Neurological: He is alert.  Skin: Skin is warm and dry.  Psychiatric: His affect is blunt. His speech is delayed. He is slowed. Thought content is not paranoid. Cognition and memory are normal. He expresses inappropriate judgment. He expresses no homicidal and no suicidal ideation.    Review of Systems  Constitutional: Negative.   HENT: Negative.   Eyes: Negative.   Respiratory: Negative.   Cardiovascular: Negative.   Gastrointestinal: Negative.   Musculoskeletal: Negative.   Skin: Negative.   Neurological: Negative.   Psychiatric/Behavioral: Negative.     Blood pressure 106/72, pulse 83, temperature 97.7 F (36.5 C), temperature source Oral, resp. rate 18, height 6' (1.829 m), weight 86.6 kg, SpO2 97 %.Body mass index is 25.9 kg/m.  General Appearance: Casual  Eye Contact:  Good  Speech:  Clear and Coherent  Volume:  Normal  Mood:  Euthymic  Affect:  Congruent  Thought Process:  Goal Directed  Orientation:  Full (Time, Place, and Person)  Thought Content:  Logical  Suicidal Thoughts:  No  Homicidal Thoughts:  No  Memory:  Immediate;   Fair Recent;   Fair Remote;   Fair  Judgement:  Fair  Insight:  Fair  Psychomotor Activity:  Normal  Concentration:  Concentration: Fair  Recall:  [employer] of Knowledge:  Fair  Language:  Fair  Akathisia:  No  Handed:  Right  AIMS (if indicated):     Assets:  Desire for Improvement Housing Physical Health  ADL's:  Intact  Cognition:  WNL  Sleep:  Number of Hours: 7.25     Treatment Plan Summary: Daily contact with patient to assess and evaluate symptoms and progress in treatment, Medication management and Plan No reason to make any changes to medication.  Supportive therapy and counseling and encouragement to the patient.  Spoke with his guardian as well.  We will need to be working on  coming up with some suitable discharge plan within the next couple days.  Alethia Berthold, MD 01/30/2019, 11:12 AM

## 2019-01-30 NOTE — Plan of Care (Signed)
D- Patient alert and oriented. Patient presents in an animated, but pleasant mood on assessment stating that he slept "on and off" last night, however, he did not have any complaints or concerns to voice to this Probation officer. Patient denies SI, HI, AVH, and pain at this time. Patient also denies any depression, although he rated it a "5/10" on his self-inventory. Patient did endorse anxiety, rating it a "3/10", stating that "just hoping I don't go back home" is what's making him anxious. Patient had no stated goals for today.  A- Scheduled medications administered to patient, per MD orders. Support and encouragement provided.  Routine safety checks conducted every 15 minutes.  Patient informed to notify staff with problems or concerns.  R- No adverse drug reactions noted. Patient contracts for safety at this time. Patient compliant with medications and treatment plan. Patient receptive, calm, and cooperative. Patient interacts well with others on the unit.  Patient remains safe at this time.  Problem: Education: Goal: Knowledge of  General Education information/materials will improve Outcome: Progressing Goal: Emotional status will improve Outcome: Progressing Goal: Mental status will improve Outcome: Progressing Goal: Verbalization of understanding the information provided will improve Outcome: Progressing   Problem: Activity: Goal: Interest or engagement in activities will improve Outcome: Progressing Goal: Sleeping patterns will improve Outcome: Progressing   Problem: Coping: Goal: Ability to verbalize frustrations and anger appropriately will improve Outcome: Progressing Goal: Ability to demonstrate self-control will improve Outcome: Progressing   Problem: Health Behavior/Discharge Planning: Goal: Identification of resources available to assist in meeting health care needs will improve Outcome: Progressing Goal: Compliance with treatment plan for underlying cause of condition  will improve Outcome: Progressing   Problem: Physical Regulation: Goal: Ability to maintain clinical measurements within normal limits will improve Outcome: Progressing   Problem: Safety: Goal: Periods of time without injury will increase Outcome: Progressing   Problem: Education: Goal: Utilization of techniques to improve thought processes will improve Outcome: Progressing Goal: Knowledge of the prescribed therapeutic regimen will improve Outcome: Progressing   Problem: Activity: Goal: Interest or engagement in leisure activities will improve Outcome: Progressing Goal: Imbalance in normal sleep/wake cycle will improve Outcome: Progressing   Problem: Coping: Goal: Coping ability will improve Outcome: Progressing Goal: Will verbalize feelings Outcome: Progressing   Problem: Health Behavior/Discharge Planning: Goal: Ability to make decisions will improve Outcome: Progressing Goal: Compliance with therapeutic regimen will improve Outcome: Progressing   Problem: Role Relationship: Goal: Will demonstrate positive changes in social behaviors and relationships Outcome: Progressing   Problem: Safety: Goal: Ability to disclose and discuss suicidal ideas will improve Outcome: Progressing Goal: Ability to identify and utilize support systems that promote safety will improve Outcome: Progressing   Problem: Self-Concept: Goal: Will verbalize positive feelings about self Outcome: Progressing Goal: Level of anxiety will decrease Outcome: Progressing
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
## 2019-01-31 NOTE — Progress Notes (Signed)
D - Patient was in the dayroom upon arrival to the unit. Patient was pleasant during assessment denying SI/HI/AVH, pain, anxiety and depression with this Probation officer. Patient observed interacting appropriately with staff and peers on the unit. Patient said he was hopefull that he will be living with his biological mother when he gets out of here.   A - Patient compliant with medication administration per MD orders and procedures on the unit. Patient given education. Patient given support and encouragement to be active in his treatment plan. Patient informed to let staff know if there are any issues or problems on the unit.   R - Patient being monitored Q 15 minutes for safety per unit protocol. Patient remains safe on the unit.

## 2019-01-31 NOTE — Plan of Care (Signed)
Patient pleasant during assessment and denies SI/HI/AVH with this Probation officer.   Problem: Education: Goal: Emotional status will improve Outcome: Progressing Goal: Mental status will improve Outcome: Progressing

## 2019-01-31 NOTE — Plan of Care (Signed)
  Problem: Education: Goal: Knowledge of [facility] Education information/materials will improve Outcome: Progressing Goal: Emotional status will improve Outcome: Progressing Goal: Mental status will improve Outcome: Progressing Goal: Verbalization of understanding the information provided will improve Outcome: Progressing  D: Patient has been calm and cooperative. Denies SI, HI and AVH. Contracts for safety  A: continue to monitor for safety R: Safety maintained

## 2019-01-31 NOTE — Progress Notes (Signed)
Recreation Therapy Notes   Date: 01/31/2019  Time: 9:30 am   Location: Craft room   Behavioral response: N/A   Intervention Topic: Values  Discussion/Intervention: Patient did not attend group.   Clinical Observations/Feedback:  Patient did not attend group.   Edie Vallandingham LRT/CTRS        Jacklyn Branan 01/31/2019 10:54 AM
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
## 2019-01-31 NOTE — Progress Notes (Signed)
D: Patient has been calm and cooperative. Denies SI, HI and AVH. Contracts for safety  A: continue to monitor for safety R: Safety maintained

## 2019-01-31 NOTE — BHH Counselor (Signed)
CSW received phone call from pt's stepmother Olivia Mackie Stanley(legal guardian) stating the pt will not be allowed to go to a group home but instead she will pursue a higher level of care if he does not want to return home. She states "I have no intention of giving up guardianship of him but his biological mother is considering taking him in." She says pt is being followed at [facility] in [place].
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
## 2019-02-01 NOTE — BHH Group Notes (Signed)
Feelings Around Diagnosis 02/01/2019 1PM  Type of Therapy/Topic:  Group Therapy:  Feelings about Diagnosis  Participation Level:  Active   Description of Group:   This group will allow patients to explore their thoughts and feelings about diagnoses they have received. Patients will be guided to explore their level of understanding and acceptance of these diagnoses. Facilitator will encourage patients to process their thoughts and feelings about the reactions of others to their diagnosis and will guide patients in identifying ways to discuss their diagnosis with significant others in their lives. This group will be process-oriented, with patients participating in exploration of their own experiences, giving and receiving support, and processing challenge from other group members.   Therapeutic Goals: 1. Patient will demonstrate understanding of diagnosis as evidenced by identifying two or more symptoms of the disorder 2. Patient will be able to express two feelings regarding the diagnosis 3. Patient will demonstrate their ability to communicate their needs through discussion and/or role play  Summary of Patient Progress: Pt reports he has "ptsd, bipolar disorder, antisocial personality disorder and adhd." Pt states he understands and accepts these diagnoses, stating "they fit my personality." Pt discussed the discord between him and his parents and feeling like he cant speak confidentially to his stepmother because she will divulge information to his father. Pt respected boundaries during session and actively participated.  Therapeutic Modalities:   Cognitive Behavioral Therapy Brief Therapy Feelings Identification    Yvette Rack, LCSW 02/01/2019 1:46 PM

## 2019-02-01 NOTE — Progress Notes (Signed)
CSW spoke with Mayer Camel to inquire about any recommendations she may have for pt as she used to work with pt in the past. Jolyn Lent reported that she and pts peer support Pamala Hurry feel that pt will do well in a group home setting and needs to get out of the home. Chervonne reported pt may benefit from CST services until he transitions into a group home and does well and then could level back down to peer support services. Chervonne reported that pt does have an IDD diagnosis but it is very mild. Per Chervonne pt does not feel he is being treated fairly in the home and gets upset because he is expected to clean and do chores and pick up after others. Chervonne reported knowledge of pt being in one group home previously and pt did not have any behavioral issues but walking off once or twice. Per Chervonne, pts step-mother did not like that the group home was getting pts full SSI check at the time and that pt walked off so she took pt back into the home.   Evalina Field, MSW, LCSW Clinical Social Work 02/01/2019 2:56 PM
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
## 2019-02-01 NOTE — BHH Suicide Risk Assessment (Signed)
[place] INPATIENT:  Family/Significant Other Suicide Prevention Education  Suicide Prevention Education:  Education Completed; Lajean Silvius, legal guardian [phone] has been identified by the patient as the family member/significant other with whom the patient will be residing, and identified as the person(s) who will aid the patient in the event of a mental health crisis (suicidal ideations/suicide attempt).  With written consent from the patient, the family member/significant other has been provided the following suicide prevention education, prior to the and/or following the discharge of the patient.  The suicide prevention education provided includes the following:  Suicide risk factors  Suicide prevention and interventions  National Suicide Hotline telephone number  [facility] assessment telephone number  [facility] Emergency Assistance [place] and/or Residential Mobile Crisis Unit telephone number  Request made of family/significant other to:  Remove weapons (e.g., guns, rifles, knives), all items previously/currently identified as safety concern.    Remove drugs/medications (over-the-counter, prescriptions, illicit drugs), all items previously/currently identified as a safety concern.  The family member/significant other verbalizes understanding of the suicide prevention education information provided.  The family member/significant other agrees to remove the items of safety concern listed above. Ms. Dorothyann Peng reports the pt has a number psychiatric hospitalizations. She says if the pt refuses to return to her home he will then have to go to a higher level of care or live with his biological mother. She says he will not be allowed to live in a group home as he has not had success in the past living in a group home. She denies the pt having access to guns or weapons.   Blaire Hodsdon T Junah Yam 02/01/2019, 10:15 AM

## 2019-02-01 NOTE — [place] FL2 (Signed)
[place] MEDICAID [id number] LEVEL OF CARE SCREENING TOOL     IDENTIFICATION  Patient Name: Joel Arroyo Birthdate: 11/26/1995 Sex: male Admission Date (Current Location): 01/29/2019  [place] and [place] Number:  [address] [id number] [facility] Facility and Address:  [facility], [address], [place], [place] [address]      Provider Number: [id number]  Attending Physician Name and Address:  Audery Amel, MD  Relative Name and Phone Number:  Maye Hides, step-mother and legal guardian [phone]    Current Level of Care: Hospital Recommended Level of Care: [facility], Other (Comment)(Group home) Prior Approval Number:    Date Approved/Denied:   PASRR Number:    Discharge Plan: Other (Comment)([facility] or Group Home)    Current Diagnoses: Patient Active Problem List   Diagnosis Date Noted  . Major depressive disorder 01/29/2019  . 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 Major depression, recurrent (HCC) 09/05/2018  . MDD (major depressive disorder), severe (HCC) 08/17/2018  . Self-inflicted laceration of wrist (HCC) 10/07/2017  . Cannabis abuse 10/07/2017  . Seizure-like activity (HCC)   . Psychosis (HCC)   . Seizures (HCC) 09/06/2016  . Suicidal ideation 09/15/2015  . Homicidal ideation 09/15/2015  . ADHD (attention deficit hyperactivity disorder) 09/14/2015    Orientation RESPIRATION BLADDER Height & Weight     Self, Time, Situation, Place  Normal Continent Weight: 191 lb (86.6 kg) Height:  6' (182.9 cm)  BEHAVIORAL SYMPTOMS/MOOD NEUROLOGICAL BOWEL NUTRITION STATUS  Other (Comment)(Verbally agressive by history per step-mother) (none reported) Continent Diet(normal)  AMBULATORY STATUS COMMUNICATION OF NEEDS Skin   Independent Verbally Normal                       Personal Care Assistance Level of Assistance  (none reported)           Functional Limitations Info  Sight Sight Info:  (wears glasses)        SPECIAL CARE FACTORS FREQUENCY  (none reported)                    Contractures Contractures Info: Not present    Additional Factors Info  Code Status Code Status Info: Full             Current Medications (02/01/2019):  This is the current hospital active medication list Current Facility-Administered Medications  Medication Dose Route Frequency Provider Last Rate Last Dose  . acetaminophen (TYLENOL) tablet 650 mg  650 mg Oral Q6H PRN Clement Sayres, MD   650 mg at 01/30/19 2042  . albuterol (VENTOLIN HFA) 108 (90 Base) MCG/ACT inhaler 1 puff  1 puff Inhalation Q4H PRN Cristofano, Worthy Rancher, MD      . alum & mag hydroxide-simeth (MAALOX/MYLANTA) 200-200-20 MG/5ML suspension 30 mL  30 mL Oral Q4H PRN Cristofano, Paul A, MD      . amphetamine-dextroamphetamine (ADDERALL) tablet 20 mg  20 mg Oral BID WC Clapacs, John T, MD   20 mg at 02/01/19 1300  . fluvoxaMINE (LUVOX) tablet 150 mg  150 mg Oral QHS Clapacs, Jackquline Denmark, MD   150 mg at 01/31/19 2128  . influenza vac split quadrivalent PF (FLUARIX) injection 0.5 mL  0.5 mL Intramuscular Tomorrow-1000 Clapacs, John T, MD      . lurasidone (LATUDA) tablet 80 mg  80 mg Oral Q supper Clapacs, Jackquline Denmark, MD   80 mg at 01/31/19 1652  . magnesium hydroxide (MILK OF MAGNESIA) suspension 30  mL  30 mL Oral Daily PRN Cristofano, Dorene Ar, MD      . neomycin-bacitracin-polymyxin (NEOSPORIN) ointment packet   Topical BID Clapacs, Madie Reno, MD   1 application at 41/66/06 0818  . nicotine (NICODERM CQ - dosed in mg/24 hours) patch 14 mg  14 mg Transdermal Daily Clapacs, Madie Reno, MD   14 mg at 02/01/19 0801  . Oxcarbazepine (TRILEPTAL) tablet 600 mg  600 mg Oral BID Clapacs, Madie Reno, MD   600 mg at 02/01/19 0801  . pantoprazole (PROTONIX) EC tablet 20 mg  20 mg Oral Daily Cristofano, Dorene Ar, MD   20 mg at 02/01/19 0802  . prazosin (MINIPRESS) capsule 1 mg  1 mg Oral Daily Cristofano, Dorene Ar, MD   1 mg at 02/01/19 0801  . temazepam  (RESTORIL) capsule 7.5 mg  7.5 mg Oral QHS PRN Cristofano, Dorene Ar, MD   7.5 mg at 01/31/19 2232     Discharge Medications: Please see discharge summary for a list of discharge medications.  Relevant Imaging Results:  Relevant Lab Results:   Additional [place], LCSW

## 2019-02-01 NOTE — BHH Counselor (Signed)
CSW received phone call from legal guardian and peer support(Barbara Clapp). Legal guardian and peer support request the pt be referred to "level 3 group home, where he has 24 hr care." They report the pt has a IDD diagnosis and received psychological testing from [facility] in [place] last month. Legal guardian faxed CSW a letter requesting the pt be referred to [facility] in [place]. CSW made several attempts to explain the admission criteria to a state hospital and how multiple hospitalizations will not guarantee admission. CSW reminded the legal guardian that earlier she declined referral for group home and that is the reason  referrals have not been submitted. Legal guardian states she will speak with physician to further address her concerns.

## 2019-02-01 NOTE — Plan of Care (Signed)
Patient rated his depression and anxiety 5/10.Pleasant and cooperative on approach.Denies SI & AVH at this time.Patient stated that he still have some homicidal thoughts towards his parents but he is not going to act on it.Patient is appropriate in the unit.Compliant with medications.Attended groups.Appetite and energy level good.Support and encouragement given.

## 2019-02-01 NOTE — BHH Counselor (Signed)
CSW called [facility] [phone] to see if there are any male beds. CSW was informed the bed was still available.  CSW faxed the information for Joaquim Lai to review.  Assunta Curtis, MSW, LCSW 02/01/2019 3:24 PM

## 2019-02-01 NOTE — Progress Notes (Signed)
Recreation Therapy Notes  INPATIENT RECREATION THERAPY ASSESSMENT  Patient Details Name: TYLIK TREESE MRN: [id number] DOB: [birth year] Today's Date: 02/01/2019       Information Obtained From: Patient  Able to Participate in Assessment/Interview: Yes  Patient Presentation: Responsive  Reason for Admission (Per Patient): Active Symptoms, Suicidal Ideation, Suicide Attempt  Patient Stressors: Family  Coping Skills:   Music, Other (Comment)(Smoke)  Leisure Interests (2+):  Games - Video games, Individual - TV  Frequency of Recreation/Participation: Weekly  Awareness of Community Resources:     [employer]:     Current Use:    If no, Barriers?:    Expressed Interest in [place] of [place]idence:  Hydrologist  Patient Main Form of Transportation: Other (Comment)(Peer support)  Patient Strengths:  Proofreader, athletic, social  Patient Identified Areas of Improvement:  Try to use coping skills more often  Patient Goal for Hospitalization:  Find a safer place  Current SI (including self-harm):  No  Current HI:  No  Current AVH: No  Staff Intervention Plan: Group Attendance, Collaborate with Interdisciplinary Treatment Team  Consent to Intern Participation: N/A  Brytani Voth 02/01/2019, 1:35 PM

## 2019-02-01 NOTE — Progress Notes (Signed)
Recreation Therapy Notes   Date: 02/01/2019  Time: 9:30 am  Location: Craft room  Behavioral response: Appropriate   Intervention Topic: Relaxation   Discussion/Intervention:   Group content today was focused on relaxation. The group defined relaxation and identified healthy ways to relax. Individuals expressed how much time they spend relaxing. Patients expressed how much their life would be if they did not make time for themselves to relax. The group stated ways they could improve their relaxation techniques in the future.  Individuals participated in the intervention "Time to Relax" where they had a chance to experience different relaxation techniques. Clinical Observations/Feedback:  Patient came to group and defined relaxation as being by yourself and self reflection. He stated that he likes to watch television and listen to music to relax. Participant explained that relaxation is important to reduce stress. Individual was social with peers and staff while participating in the intervention.    Joel Arroyo LRT/CTRS         [name] 02/01/2019 12:06 PM

## 2019-02-02 MED ORDER — TUBERCULIN PPD 5 UNIT/0.1ML ID SOLN
5.0000 [IU] | Freq: Once | INTRADERMAL | Status: AC
  Administered 2019-02-02: 15:00:00 5 [IU] via INTRADERMAL
  Filled 2019-02-02: qty 0.1

## 2019-02-02 NOTE — Progress Notes (Signed)
Recreation Therapy Notes   Date: 02/02/2019  Time: 9:30 am  Location: Craft room  Behavioral response: Appropriate   Intervention Topic: Problem Solving   Discussion/Intervention:   Group content on today was focused on problem solving. The group described what problem solving is. Patients expressed how problems affect them and how they deal with problems. Individuals identified healthy ways to deal with problems. Patients explained what normally happens to them when they do not deal with problems. The group expressed reoccurring problems for them. The group participated in the intervention "Ways to Solve problems" where patients were given a chance to explore different ways to solve problems.  Clinical Observations/Feedback:  Patient came to group and defined problem solving as fixing things by talking it out. He stated that he tried to solve his problem by running away but it did not work. Individual was social with peers and staff while participating in group.  Javari Bufkin LRT/CTRS         Hunter Pinkard 02/02/2019 11:14 AM

## 2019-02-02 NOTE — BHH Group Notes (Signed)
[place] Group Notes:  (Nursing/MHT/Case Management/Adjunct)  Date:  02/02/2019  Time:  7:31 AM  Type of Therapy:  Group Therapy  Participation Level:  Active  Participation Quality:  Appropriate  Affect:  Appropriate  Cognitive:  Alert  Insight:  Good  Engagement in Group:  Engaged  Modes of Intervention:  Support  Summary of Progress/Problems:  Joel Arroyo 02/02/2019, 7:31 AM

## 2019-02-02 NOTE — BHH Group Notes (Signed)
LCSW Group Therapy Note  02/02/2019 1:59 PM  Type of Therapy/Topic:  Group Therapy:  Emotion Regulation  Participation Level:  Active   Description of Group:   The purpose of this group is to assist patients in learning to regulate negative emotions and experience positive emotions. Patients will be guided to discuss ways in which they have been vulnerable to their negative emotions. These vulnerabilities will be juxtaposed with experiences of positive emotions or situations, and patients will be challenged to use positive emotions to combat negative ones. Special emphasis will be placed on coping with negative emotions in conflict situations, and patients will process healthy conflict resolution skills.  Therapeutic Goals: 1. Patient will identify two positive emotions or experiences to reflect on in order to balance out negative emotions 2. Patient will label two or more emotions that they find the most difficult to experience 3. Patient will demonstrate positive conflict resolution skills through discussion and/or role plays  Summary of Patient Progress: Pt was appropriate and respectful in group. Pt was able to identify emotion regulation as "opening up on how you feel" and "balance out your emotions". Pt reported that he was feeling disappointed prior to coming into the hospital and stated that he is feeling better now since his admission. Pt stated that when he is angry or anxious he starts shaking and that is how he knows he is experiencing those emotions. Pt was able to identify numerous coping skills he utilizes.   Therapeutic Modalities:   Cognitive Behavioral Therapy Feelings Identification Dialectical Behavioral Therapy   Evalina Field, MSW, LCSW Clinical Social Work 02/02/2019 1:59 PM
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
## 2019-02-02 NOTE — Progress Notes (Signed)
Patient alert and oriented x 4, affect is flat but he brightens upon approach, he was noted interacting appropriately with peers and staff, no distress noted, he denies SI/HI/AVH, complaint with medication , 15 minutes safety checks maintained will contiue to monitor.

## 2019-02-02 NOTE — BHH Counselor (Signed)
CSW spoke with peer support(Barbara Laymond Purser) at [facility] who reports a referral can be made for CST with their agency if the legal guardian is in agreement. She states staff will need to do an update to the pt's pcp and addendum to cca. CSW informed her that referrals have been made for group homes and will keep her updated on the outcome.

## 2019-02-02 NOTE — BHH Counselor (Signed)
CSW spoke with Allene Pyo at [facility] group home who states she will accept the pt but he can not be admitted into the home until 11/16.  Joaquim Lai was provided with the legal guardians contact information and she says the guardian is in agreement with this arrangement.

## 2019-02-02 NOTE — Plan of Care (Signed)
Pt rates depression and hopelessness 5/10. Pt rates anxiety 6/10. Pt was educated on care plan and verbalizes understanding. Collier Bullock RN Problem: Education: Goal: Knowledge of [facility] Education information/materials will improve Outcome: Adequate for Discharge Goal: Emotional status will improve Outcome: Adequate for Discharge Goal: Mental status will improve Outcome: Adequate for Discharge Goal: Verbalization of understanding the information provided will improve Outcome: Adequate for Discharge   Problem: Activity: Goal: Interest or engagement in activities will improve Outcome: Adequate for Discharge Goal: Sleeping patterns will improve Outcome: Adequate for Discharge   Problem: Coping: Goal: Ability to verbalize frustrations and anger appropriately will improve Outcome: Adequate for Discharge Goal: Ability to demonstrate self-control will improve Outcome: Adequate for Discharge   Problem: Health Behavior/Discharge Planning: Goal: Identification of resources available to assist in meeting health care needs will improve Outcome: Adequate for Discharge Goal: Compliance with treatment plan for underlying cause of condition will improve Outcome: Adequate for Discharge   Problem: Physical Regulation: Goal: Ability to maintain clinical measurements within normal limits will improve Outcome: Adequate for Discharge   Problem: Safety: Goal: Periods of time without injury will increase Outcome: Adequate for Discharge   Problem: Education: Goal: Utilization of techniques to improve thought processes will improve Outcome: Adequate for Discharge Goal: Knowledge of the prescribed therapeutic regimen will improve Outcome: Adequate for Discharge   Problem: Activity: Goal: Interest or engagement in leisure activities will improve Outcome: Adequate for Discharge Goal: Imbalance in normal sleep/wake cycle will improve Outcome: Adequate for Discharge   Problem:  Coping: Goal: Coping ability will improve Outcome: Adequate for Discharge Goal: Will verbalize feelings Outcome: Adequate for Discharge   Problem: Health Behavior/Discharge Planning: Goal: Ability to make decisions will improve Outcome: Adequate for Discharge Goal: Compliance with therapeutic regimen will improve Outcome: Adequate for Discharge   Problem: Role Relationship: Goal: Will demonstrate positive changes in social behaviors and relationships Outcome: Adequate for Discharge   Problem: Safety: Goal: Ability to disclose and discuss suicidal ideas will improve Outcome: Adequate for Discharge Goal: Ability to identify and utilize support systems that promote safety will improve Outcome: Adequate for Discharge   Problem: Education: Goal: Utilization of techniques to improve thought processes will improve Outcome: Progressing Goal: Knowledge of the prescribed therapeutic regimen will improve Outcome: Progressing   Problem: Coping: Goal: Coping ability will improve Outcome: Progressing Goal: Will verbalize feelings Outcome: Progressing   Problem: Coping: Goal: Coping ability will improve Outcome: Progressing   Problem: Self-Concept: Goal: Ability to disclose and discuss suicidal ideas will improve Outcome: Progressing Goal: Will verbalize positive feelings about self Outcome: Progressing
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
## 2019-02-03 NOTE — Progress Notes (Signed)
D - Patient was in the dayroom upon arrival to the unit. Patient was pleasant during assessment denying SI/HI/AVH, pain, anxiety and depression with this Probation officer. Patient observed interacting appropriately with staff and peers on the unit. Patient awaiting to hear about placement.   A - Patient compliant with medication administration per MD orders and procedures on the unit. Patient given education. Patient given support and encouragement to be active in his treatment plan. Patient informed to let staff know if there are any issues or problems on the unit.   R - Patient being monitored Q 15 minutes for safety per unit protocol. Patient remains safe on the unit.

## 2019-02-03 NOTE — Plan of Care (Signed)
Patient complaint with medication administration per MD orders and procedures on the unit.   Problem: Education: Goal: Knowledge of the prescribed therapeutic regimen will improve Outcome: Progressing

## 2019-02-03 NOTE — Plan of Care (Signed)
D- Patient alert and oriented. Patient presents in a pleasant mood on assessment stating that he slept "on and off" last night and had no major complaints or concerns to voice to this Probation officer. Patient endorsed depression and anxiety, reporting that "I can't go home and live with my bio mom and hoping to find a place to stay" is why he's feeling this way. Patient denies SI, HI, AVH, and pain at this time. Patient had no stated goals for today.  A- Scheduled medications administered to patient, per MD orders. Support and encouragement provided.  Routine safety checks conducted every 15 minutes.  Patient informed to notify staff with problems or concerns.  R- No adverse drug reactions noted. Patient contracts for safety at this time. Patient compliant with medications and treatment plan. Patient receptive, calm, and cooperative. Patient interacts well with others on the unit.  Patient remains safe at this time.  Problem: Education: Goal: Utilization of techniques to improve thought processes will improve Outcome: Progressing Goal: Knowledge of the prescribed therapeutic regimen will improve Outcome: Progressing   Problem: Coping: Goal: Coping ability will improve Outcome: Progressing Goal: Will verbalize feelings Outcome: Progressing   Problem: Safety: Goal: Ability to disclose and discuss suicidal ideas will improve Outcome: Progressing Goal: Ability to identify and utilize support systems that promote safety will improve Outcome: Progressing   Problem: Coping: Goal: Coping ability will improve Outcome: Progressing   Problem: Self-Concept: Goal: Ability to disclose and discuss suicidal ideas will improve Outcome: Progressing Goal: Will verbalize positive feelings about self Outcome: Progressing

## 2019-02-03 NOTE — Tx Team (Signed)
Interdisciplinary Treatment and Diagnostic Plan Update  02/03/2019 Time of Session: 8:30AM Joel Arroyo MRN: [id number]  Principal Diagnosis: Major depressive disorder  Secondary Diagnoses: Principal Problem:   Major depressive disorder Active Problems:   ADHD (attention deficit hyperactivity disorder)   Suicidal ideation   Homicidal ideation   Self-inflicted laceration of wrist (HCC)   Adjustment disorder with mixed disturbance of emotions and conduct   Current Medications:  Current Facility-Administered Medications  Medication Dose Route Frequency Provider Last Rate Last Dose  . acetaminophen (TYLENOL) tablet 650 mg  650 mg Oral Q6H PRN Clement Sayresristofano, Paul A, MD   650 mg at 01/30/19 2042  . albuterol (VENTOLIN HFA) 108 (90 Base) MCG/ACT inhaler 1 puff  1 puff Inhalation Q4H PRN Cristofano, Worthy RancherPaul A, MD      . alum & mag hydroxide-simeth (MAALOX/MYLANTA) 200-200-20 MG/5ML suspension 30 mL  30 mL Oral Q4H PRN Cristofano, Paul A, MD      . amphetamine-dextroamphetamine (ADDERALL) tablet 20 mg  20 mg Oral BID WC Clapacs, Jackquline DenmarkJohn T, MD   20 mg at 02/03/19 [id number]  . fluvoxaMINE (LUVOX) tablet 150 mg  150 mg Oral QHS Clapacs, Jackquline DenmarkJohn T, MD   150 mg at 02/02/19 2122  . influenza vac split quadrivalent PF (FLUARIX) injection 0.5 mL  0.5 mL Intramuscular Tomorrow-1000 Clapacs, John T, MD      . lurasidone (LATUDA) tablet 80 mg  80 mg Oral Q supper Clapacs, Jackquline DenmarkJohn T, MD   80 mg at 02/02/19 1744  . magnesium hydroxide (MILK OF MAGNESIA) suspension 30 mL  30 mL Oral Daily PRN Cristofano, Paul A, MD      . nicotine (NICODERM CQ - dosed in mg/24 hours) patch 14 mg  14 mg Transdermal Daily Clapacs, Jackquline DenmarkJohn T, MD   14 mg at 02/03/19 0810  . Oxcarbazepine (TRILEPTAL) tablet 600 mg  600 mg Oral BID Clapacs, Jackquline DenmarkJohn T, MD   600 mg at 02/03/19 [id number]  . pantoprazole (PROTONIX) EC tablet 20 mg  20 mg Oral Daily Cristofano, Worthy RancherPaul A, MD   20 mg at 02/03/19 0809  . prazosin (MINIPRESS) capsule 1 mg  1 mg Oral Daily  Cristofano, Worthy RancherPaul A, MD   1 mg at 02/03/19 0808  . temazepam (RESTORIL) capsule 7.5 mg  7.5 mg Oral QHS PRN Cristofano, Worthy RancherPaul A, MD   7.5 mg at 02/02/19 2122  . tuberculin injection 5 Units  5 Units Intradermal Once Money, Gerlene Burdockravis B, FNP   5 Units at 02/02/19 1525   PTA Medications: Medications Prior to Admission  Medication Sig Dispense Refill Last Dose  . albuterol (VENTOLIN HFA) 108 (90 Base) MCG/ACT inhaler Inhale 1 puff into the lungs every 4 (four) hours as needed (SOB). 6.7 g 1   . fluvoxaMINE (LUVOX) 50 MG tablet Take 3 tablets (150 mg total) by mouth at bedtime. 90 tablet 1   . hydrOXYzine (VISTARIL) 25 MG capsule Take 25 mg by mouth 3 (three) times daily.     Marland Kitchen. lurasidone (LATUDA) 80 MG TABS tablet Take 1 tablet (80 mg total) by mouth daily with supper. 30 tablet 1   . oxcarbazepine (TRILEPTAL) 600 MG tablet Take 1 tablet (600 mg total) by mouth 2 (two) times daily. (Patient not taking: Reported on 01/28/2019) 60 tablet 1   . pantoprazole (PROTONIX) 20 MG tablet Take 20 mg by mouth daily.     . prazosin (MINIPRESS) 1 MG capsule Take 1 mg by mouth daily.     . temazepam (RESTORIL) 7.5 MG capsule  Take 1 capsule (7.5 mg total) by mouth at bedtime as needed for sleep. (Patient not taking: Reported on 01/28/2019) 30 capsule 0   . VYVANSE 70 MG capsule Take 70 mg by mouth daily.        Patient Stressors: Marital or family conflict  Patient Strengths: Ability for insight Active sense of humor Communication skills  Treatment Modalities: Medication Management, Group therapy, Case management,  1 to 1 session with clinician, Psychoeducation, Recreational therapy.   Physician Treatment Plan for Primary Diagnosis: Major depressive disorder Long Term Goal(s): Improvement in symptoms so as ready for discharge Improvement in symptoms so as ready for discharge   Short Term Goals: Ability to verbalize feelings will improve Ability to disclose and discuss suicidal ideas Ability to demonstrate  self-control will improve Ability to maintain clinical measurements within normal limits will improve Compliance with prescribed medications will improve  Medication Management: Evaluate patient's response, side effects, and tolerance of medication regimen.  Therapeutic Interventions: 1 to 1 sessions, Unit Group sessions and Medication administration.  Evaluation of Outcomes: Progressing  Physician Treatment Plan for Secondary Diagnosis: Principal Problem:   Major depressive disorder Active Problems:   ADHD (attention deficit hyperactivity disorder)   Suicidal ideation   Homicidal ideation   Self-inflicted laceration of wrist (HCC)   Adjustment disorder with mixed disturbance of emotions and conduct  Long Term Goal(s): Improvement in symptoms so as ready for discharge Improvement in symptoms so as ready for discharge   Short Term Goals: Ability to verbalize feelings will improve Ability to disclose and discuss suicidal ideas Ability to demonstrate self-control will improve Ability to maintain clinical measurements within normal limits will improve Compliance with prescribed medications will improve     Medication Management: Evaluate patient's response, side effects, and tolerance of medication regimen.  Therapeutic Interventions: 1 to 1 sessions, Unit Group sessions and Medication administration.  Evaluation of Outcomes: Progressing   RN Treatment Plan for Primary Diagnosis: Major depressive disorder Long Term Goal(s): Knowledge of disease and therapeutic regimen to maintain health will improve  Short Term Goals: Ability to remain free from injury will improve, Ability to verbalize frustration and anger appropriately will improve, Ability to demonstrate self-control, Ability to participate in decision making will improve, Ability to verbalize feelings will improve, Ability to disclose and discuss suicidal ideas, Ability to identify and develop effective coping behaviors will  improve and Compliance with prescribed medications will improve  Medication Management: RN will administer medications as ordered by provider, will assess and evaluate patient's response and provide education to patient for prescribed medication. RN will report any adverse and/or side effects to prescribing provider.  Therapeutic Interventions: 1 on 1 counseling sessions, Psychoeducation, Medication administration, Evaluate responses to treatment, Monitor vital signs and CBGs as ordered, Perform/monitor CIWA, COWS, AIMS and Fall Risk screenings as ordered, Perform wound care treatments as ordered.  Evaluation of Outcomes: Progressing   LCSW Treatment Plan for Primary Diagnosis: Major depressive disorder Long Term Goal(s): Safe transition to appropriate next level of care at discharge, Engage patient in therapeutic group addressing interpersonal concerns.  Short Term Goals: Engage patient in aftercare planning with referrals and resources, Increase social support, Increase ability to appropriately verbalize feelings, Increase emotional regulation, Facilitate acceptance of mental health diagnosis and concerns, Facilitate patient progression through stages of change regarding substance use diagnoses and concerns, Identify triggers associated with mental health/substance abuse issues and Increase skills for wellness and recovery  Therapeutic Interventions: Assess for all discharge needs, 1 to 1 time with Social  worker, Explore available resources and support systems, Assess for adequacy in community support network, Educate family and significant other(s) on suicide prevention, Complete Psychosocial Assessment, Interpersonal group therapy.  Evaluation of Outcomes: Progressing   Progress in Treatment: Attending groups: Yes. Participating in groups: Yes. Taking medication as prescribed: Yes. Toleration medication: Yes. Family/Significant other contact made: Yes, individual(s) contacted:  Maye Hides, legal guardian Patient understands diagnosis: Yes. Discussing patient identified problems/goals with staff: Yes. Medical problems stabilized or resolved: Yes. Denies suicidal/homicidal ideation: Yes Issues/concerns per patient self-inventory: No. Other: NA  New problem(s) identified: No, Describe:  None  New Short Term/Long Term Goal(s):  Engage patient in aftercare planning with referrals and resources, Increase social support, Increase ability to appropriately verbalize feelings, Increase emotional regulation  Patient Goals:  "to get better and stop cutting myself"  Discharge Plan or Barriers: Patient has a legal guardian. However, he does not want to return home. Placement options need to be considered and discussed with his guardian. Update 02/03/19-Pt legal guardian has stated she does not want him to return to the home and would like him to be referred for group home placement and CST. Pt has a peer support through [employer] who reports the pt can be referred for CST with the current provider if the guardian is in agreement. CSW team secured group home for the pt and he was scheduled for pick up on 11/16 however received phone call from group home staff today(11/12) stating they will no longer accept the pt after speaking with the pt guardian who reported various concerns that were not reported to the CSW team. Reason for Continuation of Hospitalization: Depression Homicidal ideation Suicidal ideation  Estimated Length of Stay:  Scheduled for d/c on 11/13  Attendees: Patient:  02/03/2019 9:58 AM  Physician: Dr. Toni Amend 02/03/2019 9:58 AM  Nursing: Mickeal Needy Ravenell 02/03/2019 9:58 AM  RN Care Manager: 02/03/2019 9:58 AM  Social Worker: Suzan Slick, LCSW Terri Piedra 02/03/2019 9:58 AM  Recreational Therapist: Garret Reddish 02/03/2019 9:58 AM  Other:  02/03/2019 9:58 AM  Other:  02/03/2019 9:58 AM  Other: 02/03/2019 9:58 AM    Scribe for  Treatment Team: Roselyn Bering, MSW, LCSW Clinical Social Work 02/03/2019 9:58 AM

## 2019-02-03 NOTE — Progress Notes (Signed)
CSW spoke with Jeanice Lim from [facility] who reported she wants to come out to see pt to initiate her case. CSW informed Jeanice Lim that pt is scheduled to discharge from hospital tomorrow 02/04/2019 back home. Jeanice Lim reported that she will come to speak with pt today at 12:00pm. She will call CSW if she needs to speak with CSW team after speaking with pt.    Evalina Field, MSW, LCSW Clinical Social Work 02/03/2019 9:31 AM

## 2019-02-03 NOTE — Progress Notes (Signed)
Recreation Therapy Notes   Date: 02/03/2019  Time: 9:30 am  Location: Craft room  Behavioral response: Appropriate   Intervention Topic: Emotions   Discussion/Intervention:   Group content on today was focused on emotions. The group identified what emotions are and why it is important to have emotions. Patients expressed some positive and negative emotions. Individuals gave some past experiences on how they normally dealt with emotions in the past. The group described some positive ways to deal with emotions in the future. Patients participated in the intervention "Name the Jerl Santos" where individuals were given a chance to experience different emotions.  Clinical Observations/Feedback:  Patient came to group and defined emotions as feelings. He stated that he normally deals with emotions by being impulsive and violent. Participant identified free, lost, rejected, confident and miserable and emotions he commonly feels. Individual expressed that actions come before consequences. Individual was social with peers and staff while participating in group.  Maud Rubendall LRT/CTRS         Alycia Cooperwood 02/03/2019 11:11 AM

## 2019-02-03 NOTE — Progress Notes (Signed)
Patient alert and oriented x 4, affect is bright, he is interacting appropriately with peers sometimes childlike but for most part he is appropriate on  the unit. Patient denies SI/HI/AVH ,complaint witt medication regimen, 15 minutes safety checks maintained will continue  to monitor .
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
## 2019-02-03 NOTE — Progress Notes (Signed)
[facility]  02/03/2019 4:12 PM Joel Arroyo  MRN:  [id number] Subjective: Follow-up for this patient with depression and ADHD developmental disability behavior problems.  Patient seen chart reviewed.  Patient tells me he is feeling "better".  His affect certainly looks bright and calm.  Not displaying any signs of distress.  Takes care of his ADLs fine.  Interacts with others without difficulty.  Not having any psychotic symptoms or any current suicidal thoughts.  He wants to remind me that he still feels uncomfortable going back home because he feels afraid of his stepfather.  We had had arrangements starting to come together for a group home but it turns out the mother sabotaged it by telling the group home people a lot of negative things about the patient. Principal Problem: Major depressive disorder Diagnosis: Principal Problem:   Major depressive disorder Active Problems:   ADHD (attention deficit hyperactivity disorder)   Suicidal ideation   Homicidal ideation   Self-inflicted laceration of wrist (HCC)   Adjustment disorder with mixed disturbance of emotions and conduct  Total Time spent with patient: 30 minutes  Past Psychiatric History: Long history of behavior problems family problems multiple hospitalizations self injury  Past Medical History:  Past Medical History:  Diagnosis Date  . Anxiety   . Asthma   . Bipolar 1 disorder (HCC)   . Depression   . PTSD (post-traumatic stress disorder)   . Seizures (HCC)     Past Surgical History:  Procedure Laterality Date  . HERNIA REPAIR     Family History:  Family History  Problem Relation Age of Onset  . Kidney cancer Neg Hx   . Kidney disease Neg Hx   . Prostate cancer Neg Hx    Family Psychiatric  History: See previous Social History:  Social History   Substance and Sexual Activity  Alcohol Use No     Social History   Substance and Sexual Activity  Drug Use No    Social History   Socioeconomic  History  . Marital status: Single    Spouse name: Not on file  . Number of children: Not on file  . Years of education: Not on file  . Highest education level: Not on file  Occupational History  . Not on file  Social Needs  . Financial resource strain: Not on file  . Food insecurity    Worry: Not on file    Inability: Not on file  . Transportation needs    Medical: Not on file    Non-medical: Not on file  Tobacco Use  . Smoking status: Current Every Day Smoker    Packs/day: 0.50    Types: Cigarettes  . Smokeless tobacco: Never Used  Substance and Sexual Activity  . Alcohol use: No  . Drug use: No  . Sexual activity: Not on file  Lifestyle  . Physical activity    Days per week: Not on file    Minutes per session: Not on file  . Stress: Not on file  Relationships  . Social Musicianconnections    Talks on phone: Not on file    Gets together: Not on file    Attends religious service: Not on file    Active member of club or organization: Not on file    Attends meetings of clubs or organizations: Not on file    Relationship status: Not on file  Other Topics Concern  . Not on file  Social History Narrative  . Not on  file   Additional Social History:                         Sleep: Fair  Appetite:  Fair  Current Medications: Current Facility-Administered Medications  Medication Dose Route Frequency Provider Last Rate Last Dose  . acetaminophen (TYLENOL) tablet 650 mg  650 mg Oral Q6H PRN Dixie Dials, MD   650 mg at 01/30/19 2042  . albuterol (VENTOLIN HFA) 108 (90 Base) MCG/ACT inhaler 1 puff  1 puff Inhalation Q4H PRN Cristofano, Dorene Ar, MD      . alum & mag hydroxide-simeth (MAALOX/MYLANTA) 200-200-20 MG/5ML suspension 30 mL  30 mL Oral Q4H PRN Cristofano, Paul A, MD      . amphetamine-dextroamphetamine (ADDERALL) tablet 20 mg  20 mg Oral BID WC , Madie Reno, MD   20 mg at 02/03/19 1212  . fluvoxaMINE (LUVOX) tablet 150 mg  150 mg Oral QHS , Madie Reno, MD   150 mg at 02/02/19 2122  . influenza vac split quadrivalent PF (FLUARIX) injection 0.5 mL  0.5 mL Intramuscular Tomorrow-1000 ,  T, MD      . lurasidone (LATUDA) tablet 80 mg  80 mg Oral Q supper , Madie Reno, MD   80 mg at 02/02/19 1744  . magnesium hydroxide (MILK OF MAGNESIA) suspension 30 mL  30 mL Oral Daily PRN Cristofano, Paul A, MD      . nicotine (NICODERM CQ - dosed in mg/24 hours) patch 14 mg  14 mg Transdermal Daily , Madie Reno, MD   14 mg at 02/03/19 0810  . Oxcarbazepine (TRILEPTAL) tablet 600 mg  600 mg Oral BID , Madie Reno, MD   600 mg at 02/03/19 1607  . pantoprazole (PROTONIX) EC tablet 20 mg  20 mg Oral Daily Cristofano, Dorene Ar, MD   20 mg at 02/03/19 0809  . prazosin (MINIPRESS) capsule 1 mg  1 mg Oral Daily Cristofano, Dorene Ar, MD   1 mg at 02/03/19 0808  . temazepam (RESTORIL) capsule 7.5 mg  7.5 mg Oral QHS PRN Cristofano, Dorene Ar, MD   7.5 mg at 02/02/19 2122  . tuberculin injection 5 Units  5 Units Intradermal Once Money, Lowry Ram, FNP   5 Units at 02/02/19 1525    Lab Results: No results found for this or any previous visit (from the past 48 hour(s)).  Blood Alcohol level:  Lab Results  Component Value Date   ETH <10 01/28/2019   ETH <10 37/12/6267    Metabolic Disorder Labs: Lab Results  Component Value Date   HGBA1C 5.3 09/05/2016   MPG 105 09/05/2016   No results found for: PROLACTIN Lab Results  Component Value Date   CHOL 180 01/29/2019   TRIG 64 01/29/2019   HDL 46 01/29/2019   CHOLHDL 3.9 01/29/2019   VLDL 13 01/29/2019   LDLCALC 121 (H) 01/29/2019    Physical Findings: AIMS:  , ,  ,  ,    CIWA:    COWS:     Musculoskeletal: Strength & Muscle Tone: within normal limits Gait & Station: normal Patient leans: N/A  Psychiatric Specialty Exam: Physical Exam  Nursing note and vitals reviewed. Constitutional: He appears well-developed and well-nourished.  HENT:  Head: Normocephalic and atraumatic.  Eyes:  Pupils are equal, round, and reactive to light. Conjunctivae are normal.  Neck: Normal range of motion.  Cardiovascular: Normal heart sounds.  Respiratory: Effort normal.  GI: Soft.  Musculoskeletal: Normal range  of motion.  Neurological: He is alert.  Skin: Skin is warm and dry.  Psychiatric: He has a normal mood and affect. His behavior is normal. Judgment and thought content normal.    Review of Systems  Constitutional: Negative.   HENT: Negative.   Eyes: Negative.   Respiratory: Negative.   Cardiovascular: Negative.   Gastrointestinal: Negative.   Musculoskeletal: Negative.   Skin: Negative.   Neurological: Negative.   Psychiatric/Behavioral: Negative.     Blood pressure 116/79, pulse 91, temperature 98 F (36.7 C), temperature source Oral, resp. rate 18, height 6' (1.829 m), weight 86.6 kg, SpO2 97 %.Body mass index is 25.9 kg/m.  General Appearance: Casual  Eye Contact:  Good  Speech:  Clear and Coherent  Volume:  Normal  Mood:  Euthymic  Affect:  Congruent  Thought Process:  Goal Directed  Orientation:  Full (Time, Place, and Person)  Thought Content:  Logical  Suicidal Thoughts:  No  Homicidal Thoughts:  No  Memory:  Immediate;   Fair Recent;   Fair Remote;   Fair  Judgement:  Fair  Insight:  Fair  Psychomotor Activity:  Normal  Concentration:  Concentration: Fair  Recall:  [employer] of Knowledge:  Fair  Language:  Fair  Akathisia:  No  Handed:  Right  AIMS (if indicated):     Assets:  Desire for Improvement  ADL's:  Intact  Cognition:  WNL  Sleep:  Number of Hours: 5.5     Treatment Plan Summary: Daily contact with patient to assess and evaluate symptoms and progress in treatment, Medication management and Plan No indication to change any medicine.  The patient continues to insist that he will not go home.  Family has sabotaged efforts at getting a group home placement done.  Meanwhile patient really does not require further hospital level treatment.   Case discussed with treatment team today.  We will likely be pushing for discharge plan within the next day.  Mordecai Rasmussen, MD 02/03/2019, 4:12 PM

## 2019-02-03 NOTE — [facility] Group Notes (Signed)
LCSW Group Therapy Note  02/03/2019 1:00 PM  Type of Therapy/Topic:  Group Therapy:  Balance in Life  Participation Level:  Active  Description of Group:    This group will address the concept of balance and how it feels and looks when one is unbalanced. Patients will be encouraged to process areas in their lives that are out of balance and identify reasons for remaining unbalanced. Facilitators will guide patients in utilizing problem-solving interventions to address and correct the stressor making their life unbalanced. Understanding and applying boundaries will be explored and addressed for obtaining and maintaining a balanced life. Patients will be encouraged to explore ways to assertively make their unbalanced needs known to significant others in their lives, using other group members and facilitator for support and feedback.  Therapeutic Goals: 1. Patient will identify two or more emotions or situations they have that consume much of in their lives. 2. Patient will identify signs/triggers that life has become out of balance:  3. Patient will identify two ways to set boundaries in order to achieve balance in their lives:  4. Patient will demonstrate ability to communicate their needs through discussion and/or role plays  Summary of Patient Progress: Patient was present for group.  Patient was an active participant.  Patient shared that health and wellness means "staying fit, eating well, exercising".  Patient was supportive of other group members. Patient shared how he tries to do these things in his life.  Therapeutic Modalities:   Cognitive Behavioral Therapy Solution-Focused Therapy Assertiveness Training  Assunta Curtis MSW, LCSW 02/03/2019 2:26 PM

## 2019-02-04 NOTE — [facility] Group Notes (Signed)
Feelings Around Relapse 02/04/2019 1PM  Type of Therapy and Topic:  Group Therapy:  Feelings around Relapse and Recovery  Participation Level:  Active   Description of Group:    Patients in this group will discuss emotions they experience before and after a relapse. They will process how experiencing these feelings, or avoidance of experiencing them, relates to having a relapse. Facilitator will guide patients to explore emotions they have related to recovery. Patients will be encouraged to process which emotions are more powerful. They will be guided to discuss the emotional reaction significant others in their lives may have to patients' relapse or recovery. Patients will be assisted in exploring ways to respond to the emotions of others without this contributing to a relapse.  Therapeutic Goals: 1. Patient will identify two or more emotions that lead to a relapse for them 2. Patient will identify two emotions that result when they relapse 3. Patient will identify two emotions related to recovery 4. Patient will demonstrate ability to communicate their needs through discussion and/or role plays   Summary of Patient Progress: Actively and appropriately participated in session. Pt identified his peer support, therapist and doctor as a part of his circle of support to assist him with relapse prevention. Pt discussed with group his decision to live in a group home and stressful relationship he has with his family. Pt demonstrated insight and respected boundaries during session.    Therapeutic Modalities:   Cognitive Behavioral Therapy Solution-Focused Therapy Assertiveness Training Relapse Prevention Therapy   Yvette Rack, LCSW 02/04/2019 2:12 PM

## 2019-02-04 NOTE — Progress Notes (Signed)
[facility]  02/04/2019 5:36 PM Joel Arroyo  MRN:  [id number] Subjective: Follow-up for this patient with autism and depression.  Patient says he continues to feel better every day.  Denies suicidal or homicidal thought.  He is clearly keeping himself in the loop knowing what Adult Protective Services is doing for him.  No new physical complaints however.  He is not having any behavior problems on the unit. Principal Problem: Major depressive disorder Diagnosis: Principal Problem:   Major depressive disorder Active Problems:   ADHD (attention deficit hyperactivity disorder)   Suicidal ideation   Homicidal ideation   Self-inflicted laceration of wrist (HCC)   Adjustment disorder with mixed disturbance of emotions and conduct  Total Time spent with patient: 15 minutes  Past Psychiatric History: Long history of intermittent mood symptoms autism ADHD  Past Medical History:  Past Medical History:  Diagnosis Date  . Anxiety   . Asthma   . Bipolar 1 disorder (HCC)   . Depression   . PTSD (post-traumatic stress disorder)   . Seizures (HCC)     Past Surgical History:  Procedure Laterality Date  . HERNIA REPAIR     Family History:  Family History  Problem Relation Age of Onset  . Kidney cancer Neg Hx   . Kidney disease Neg Hx   . Prostate cancer Neg Hx    Family Psychiatric  History: See previous Social History:  Social History   Substance and Sexual Activity  Alcohol Use No     Social History   Substance and Sexual Activity  Drug Use No    Social History   Socioeconomic History  . Marital status: Single    Spouse name: Not on file  . Number of children: Not on file  . Years of education: Not on file  . Highest education level: Not on file  Occupational History  . Not on file  Social Needs  . Financial resource strain: Not on file  . Food insecurity    Worry: Not on file    Inability: Not on file  . Transportation needs    Medical: Not on file     Non-medical: Not on file  Tobacco Use  . Smoking status: Current Every Day Smoker    Packs/day: 0.50    Types: Cigarettes  . Smokeless tobacco: Never Used  Substance and Sexual Activity  . Alcohol use: No  . Drug use: No  . Sexual activity: Not on file  Lifestyle  . Physical activity    Days per week: Not on file    Minutes per session: Not on file  . Stress: Not on file  Relationships  . Social Musicianconnections    Talks on phone: Not on file    Gets together: Not on file    Attends religious service: Not on file    Active member of club or organization: Not on file    Attends meetings of clubs or organizations: Not on file    Relationship status: Not on file  Other Topics Concern  . Not on file  Social History Narrative  . Not on file   Additional Social History:                         Sleep: Fair  Appetite:  Fair  Current Medications: Current Facility-Administered Medications  Medication Dose Route Frequency Provider Last Rate Last Dose  . acetaminophen (TYLENOL) tablet 650 mg  650 mg Oral Q6H  PRN Clement Sayres, MD   650 mg at 02/04/19 1031  . albuterol (VENTOLIN HFA) 108 (90 Base) MCG/ACT inhaler 1 puff  1 puff Inhalation Q4H PRN Cristofano, Worthy Rancher, MD      . alum & mag hydroxide-simeth (MAALOX/MYLANTA) 200-200-20 MG/5ML suspension 30 mL  30 mL Oral Q4H PRN Cristofano, Paul A, MD      . amphetamine-dextroamphetamine (ADDERALL) tablet 20 mg  20 mg Oral BID WC Lestine Rahe, Jackquline Denmark, MD   20 mg at 02/04/19 1411  . fluvoxaMINE (LUVOX) tablet 150 mg  150 mg Oral QHS Rolin Schult, Jackquline Denmark, MD   150 mg at 02/03/19 2112  . influenza vac split quadrivalent PF (FLUARIX) injection 0.5 mL  0.5 mL Intramuscular Tomorrow-1000 Elvert Cumpton T, MD      . lurasidone (LATUDA) tablet 80 mg  80 mg Oral Q supper Seara Hinesley, Jackquline Denmark, MD   80 mg at 02/04/19 1652  . magnesium hydroxide (MILK OF MAGNESIA) suspension 30 mL  30 mL Oral Daily PRN Cristofano, Paul A, MD      . nicotine (NICODERM  CQ - dosed in mg/24 hours) patch 14 mg  14 mg Transdermal Daily Farren Landa, Jackquline Denmark, MD   14 mg at 02/04/19 0827  . Oxcarbazepine (TRILEPTAL) tablet 600 mg  600 mg Oral BID Arvell Pulsifer, Jackquline Denmark, MD   600 mg at 02/04/19 1652  . pantoprazole (PROTONIX) EC tablet 20 mg  20 mg Oral Daily Cristofano, Worthy Rancher, MD   20 mg at 02/04/19 0827  . prazosin (MINIPRESS) capsule 1 mg  1 mg Oral Daily Cristofano, Worthy Rancher, MD   1 mg at 02/04/19 0827  . temazepam (RESTORIL) capsule 7.5 mg  7.5 mg Oral QHS PRN Cristofano, Worthy Rancher, MD   7.5 mg at 02/03/19 2129    Lab Results: No results found for this or any previous visit (from the past 48 hour(s)).  Blood Alcohol level:  Lab Results  Component Value Date   ETH <10 01/28/2019   ETH <10 12/24/2018    Metabolic Disorder Labs: Lab Results  Component Value Date   HGBA1C 5.3 09/05/2016   MPG 105 09/05/2016   No results found for: PROLACTIN Lab Results  Component Value Date   CHOL 180 01/29/2019   TRIG 64 01/29/2019   HDL 46 01/29/2019   CHOLHDL 3.9 01/29/2019   VLDL 13 01/29/2019   LDLCALC 121 (H) 01/29/2019    Physical Findings: AIMS:  , ,  ,  ,    CIWA:    COWS:     Musculoskeletal: Strength & Muscle Tone: within normal limits Gait & Station: normal Patient leans: N/A  Psychiatric Specialty Exam: Physical Exam  Nursing note and vitals reviewed. Constitutional: He appears well-developed and well-nourished.  HENT:  Head: Normocephalic and atraumatic.  Eyes: Pupils are equal, round, and reactive to light. Conjunctivae are normal.  Neck: Normal range of motion.  Cardiovascular: Regular rhythm and normal heart sounds.  Respiratory: Effort normal. No respiratory distress.  GI: Soft.  Musculoskeletal: Normal range of motion.  Neurological: He is alert.  Skin: Skin is warm and dry.  Psychiatric: He has a normal mood and affect. His behavior is normal. Judgment and thought content normal.    Review of Systems  Constitutional: Negative.   HENT:  Negative.   Eyes: Negative.   Respiratory: Negative.   Cardiovascular: Negative.   Gastrointestinal: Negative.   Musculoskeletal: Negative.   Skin: Negative.   Neurological: Negative.   Psychiatric/Behavioral: Negative.  Blood pressure 124/85, pulse 79, temperature 97.9 F (36.6 C), temperature source Oral, resp. rate 17, height 6' (1.829 m), weight 86.6 kg, SpO2 98 %.Body mass index is 25.9 kg/m.  General Appearance: Casual  Eye Contact:  Good  Speech:  Clear and Coherent  Volume:  Normal  Mood:  Euthymic  Affect:  Congruent  Thought Process:  Goal Directed  Orientation:  Full (Time, Place, and Person)  Thought Content:  Logical  Suicidal Thoughts:  No  Homicidal Thoughts:  No  Memory:  Immediate;   Fair Recent;   Fair Remote;   Fair  Judgement:  Fair  Insight:  Fair  Psychomotor Activity:  Normal  Concentration:  Concentration: Fair  Recall:  [employer] of Knowledge:  Fair  Language:  Fair  Akathisia:  No  Handed:  Right  AIMS (if indicated):     Assets:  Desire for Improvement Physical Health  ADL's:  Intact  Cognition:  Impaired,  Mild  Sleep:  Number of Hours: 6     Treatment Plan Summary: Daily contact with patient to assess and evaluate symptoms and progress in treatment, Medication management and Plan No change to medication management.  Encouraged him to continue his good behavior.  Treatment team keeping abreast as well with protective services.  Hope for discharge next week.  Alethia Berthold, MD 02/04/2019, 5:36 PM

## 2019-02-04 NOTE — Plan of Care (Signed)
D- Patient alert and oriented. Patient presents in a pleasant mood on assessment stating that he slept "on and off, I did ok" last night and had no complaints to voice to this Probation officer. Patient denies any signs/symptoms of depression, however, he stated that he's anxious about "hoe the investigation is going to go". Patient also denies SI, HI, AVH, and pain at this time. Patient's goal for today is to "try to go to groups".  A- Scheduled medications administered to patient, per MD orders. Support and encouragement provided.  Routine safety checks conducted every 15 minutes.  Patient informed to notify staff with problems or concerns.  R- No adverse drug reactions noted. Patient contracts for safety at this time. Patient compliant with medications and treatment plan. Patient receptive, calm, and cooperative. Patient interacts well with others on the unit.  Patient remains safe at this time.  Problem: Education: Goal: Utilization of techniques to improve thought processes will improve Outcome: Progressing Goal: Knowledge of the prescribed therapeutic regimen will improve Outcome: Progressing   Problem: Coping: Goal: Coping ability will improve Outcome: Progressing Goal: Will verbalize feelings Outcome: Progressing   Problem: Safety: Goal: Ability to disclose and discuss suicidal ideas will improve Outcome: Progressing Goal: Ability to identify and utilize support systems that promote safety will improve Outcome: Progressing   Problem: Coping: Goal: Coping ability will improve Outcome: Progressing   Problem: Self-Concept: Goal: Ability to disclose and discuss suicidal ideas will improve Outcome: Progressing Goal: Will verbalize positive feelings about self Outcome: Progressing

## 2019-02-04 NOTE — Progress Notes (Signed)
CSW spoke with Jeanice Lim from [facility] who requested FL2 and H&P for pt so she could reach out to potential placements for pt. CSW faxed information to [phone] and fax was successful.    Evalina Field, MSW, LCSW Clinical Social Work 02/04/2019 9:43 AM

## 2019-02-04 NOTE — Progress Notes (Signed)
Recreation Therapy Notes    Date: 02/04/2019  Time: 9:30 am  Location: Craft room  Behavioral response: Appropriate   Intervention Topic: Self-esteem   Discussion/Intervention:   Group content today was focused on self-esteem. Patient defined self-esteem and where it comes form. The group described reasons self-esteem is important. Individuals stated things that impact self-esteem and positive ways to improve self-esteem. The group participated in the intervention "Exploring self-esteem" where patients were able to create a collage of positive things that makes them who they are. Clinical Observations/Feedback:  Patient came to group and stated having good self-esteem causes you to have good and high energy. He explained that self-esteem can affect you mood. Participant expressed that others around you can affect your self-esteem. Individual was social with peers and staff while participating in group.  [name] LRT/CTRS         Joel Arroyo 02/04/2019 11:21 AM

## 2019-02-04 NOTE — Progress Notes (Signed)
Patient's PPD results have been recorded in the chart.

## 2019-02-04 NOTE — BHH Counselor (Signed)
CSW received phone call from [facility]) who reports she will be pursuing a protective order this morning due to the pt's legal guardian not coming up with a safety plan for the pt. She states she provided the legal guardian with a list of group home options and encouraged her to reach out to them and the guardian refused to do so.

## 2019-02-05 NOTE — Plan of Care (Signed)
Patient verbalized positive feelings about himself denies SI/HI/AVH

## 2019-02-05 NOTE — Progress Notes (Signed)
Patient alert and oriented x 4, affect is bright, he is interacting appropriately with peers, interaction with staff is childlike, he appears attention seeking and intrusive with multiple request.  Patient denies SI/HI/AVH ,complaint witt medication regimen, 15 minutes safety checks maintained will continue  to monitor .

## 2019-02-05 NOTE — Plan of Care (Signed)
Patient denies SI/HI/AVH. Patient is frequently at nurses station with somatic complaint. Patient is frequently having negative social interactions with peers. Patient can be argumentative with peers.    Problem: Education: Goal: Utilization of techniques to improve thought processes will improve Outcome: Not Progressing Goal: Knowledge of the prescribed therapeutic regimen will improve Outcome: Not Progressing   Problem: Coping: Goal: Coping ability will improve Outcome: Not Progressing

## 2019-02-05 NOTE — Progress Notes (Signed)
[facility]  02/05/2019 1:00 PM Joel Arroyo  MRN:  [id number] Subjective: Patient is a 23 admitted on 11/67/20 when police were called after he ran away from the home after a fight with his parents.  The patient had taken a knife and cut himself superficially on both his wrist and told the admitting psychiatrist he was planning to hang himself from an overpass.  Objective: Patient is seen and examined.  Patient is a 23 year old male with the above-stated past psychiatric history who is seen in follow-up.  His progress note from yesterday stated he continued to feel better every day.  He is very focused on what Department of Social Services investigation will find.  He denied complaint today except for the fact that he felt as though if he was discharged anytime soon he was unsure whether or not he might hurt his parents.  He denied any auditory or visual hallucinations.  He denied any suicidal ideation.  His current medications include Luvox 150 mg p.o. nightly, Adderall, Latuda and temazepam.  His vital signs are stable, he is afebrile.  He slept 6.75 hours last night.  Dr. Darlin Droplapaks his plan from yesterday was no change to his medications.  He stated today he was trying to do his best, but there was another patient on the ward that was aggravating him, and he felt feelings of trying to hurt him.  He stated he would go to the staff and talk to them to try and talk him down.  Review of his laboratories revealed essentially normal electrolytes, normal CBC with differential, blood alcohol was less than 10, drug screen was positive for amphetamines (he is prescribed Adderall).  Principal Problem: Major depressive disorder Diagnosis: Principal Problem:   Major depressive disorder Active Problems:   ADHD (attention deficit hyperactivity disorder)   Suicidal ideation   Homicidal ideation   Self-inflicted laceration of wrist (HCC)   Adjustment disorder with mixed disturbance of emotions and  conduct  Total Time spent with patient: 20 minutes  Past Psychiatric History: See admission H&P  Past Medical History:  Past Medical History:  Diagnosis Date  . Anxiety   . Asthma   . Bipolar 1 disorder (HCC)   . Depression   . PTSD (post-traumatic stress disorder)   . Seizures (HCC)     Past Surgical History:  Procedure Laterality Date  . HERNIA REPAIR     Family History:  Family History  Problem Relation Age of Onset  . Kidney cancer Neg Hx   . Kidney disease Neg Hx   . Prostate cancer Neg Hx    Family Psychiatric  History: See admission H&P Social History:  Social History   Substance and Sexual Activity  Alcohol Use No     Social History   Substance and Sexual Activity  Drug Use No    Social History   Socioeconomic History  . Marital status: Single    Spouse name: Not on file  . Number of children: Not on file  . Years of education: Not on file  . Highest education level: Not on file  Occupational History  . Not on file  Social Needs  . Financial resource strain: Not on file  . Food insecurity    Worry: Not on file    Inability: Not on file  . Transportation needs    Medical: Not on file    Non-medical: Not on file  Tobacco Use  . Smoking status: Current Every Day Smoker  Packs/day: 0.50    Types: Cigarettes  . Smokeless tobacco: Never Used  Substance and Sexual Activity  . Alcohol use: No  . Drug use: No  . Sexual activity: Not on file  Lifestyle  . Physical activity    Days per week: Not on file    Minutes per session: Not on file  . Stress: Not on file  Relationships  . Social Musician on phone: Not on file    Gets together: Not on file    Attends religious service: Not on file    Active member of club or organization: Not on file    Attends meetings of clubs or organizations: Not on file    Relationship status: Not on file  Other Topics Concern  . Not on file  Social History Narrative  . Not on file   Additional  Social History:                         Sleep: Good  Appetite:  Good  Current Medications: Current Facility-Administered Medications  Medication Dose Route Frequency Provider Last Rate Last Dose  . acetaminophen (TYLENOL) tablet 650 mg  650 mg Oral Q6H PRN Clement Sayres, MD   650 mg at 02/05/19 0823  . albuterol (VENTOLIN HFA) 108 (90 Base) MCG/ACT inhaler 1 puff  1 puff Inhalation Q4H PRN Cristofano, Worthy Rancher, MD      . alum & mag hydroxide-simeth (MAALOX/MYLANTA) 200-200-20 MG/5ML suspension 30 mL  30 mL Oral Q4H PRN Cristofano, Paul A, MD      . amphetamine-dextroamphetamine (ADDERALL) tablet 20 mg  20 mg Oral BID WC Clapacs, Jackquline Denmark, MD   20 mg at 02/05/19 1251  . fluvoxaMINE (LUVOX) tablet 150 mg  150 mg Oral QHS Clapacs, Jackquline Denmark, MD   150 mg at 02/04/19 2126  . influenza vac split quadrivalent PF (FLUARIX) injection 0.5 mL  0.5 mL Intramuscular Tomorrow-1000 Clapacs, John T, MD      . lurasidone (LATUDA) tablet 80 mg  80 mg Oral Q supper Clapacs, Jackquline Denmark, MD   80 mg at 02/04/19 1652  . magnesium hydroxide (MILK OF MAGNESIA) suspension 30 mL  30 mL Oral Daily PRN Cristofano, Paul A, MD      . nicotine (NICODERM CQ - dosed in mg/24 hours) patch 14 mg  14 mg Transdermal Daily Clapacs, Jackquline Denmark, MD   14 mg at 02/05/19 0823  . Oxcarbazepine (TRILEPTAL) tablet 600 mg  600 mg Oral BID Clapacs, Jackquline Denmark, MD   600 mg at 02/05/19 0823  . pantoprazole (PROTONIX) EC tablet 20 mg  20 mg Oral Daily Cristofano, Worthy Rancher, MD   20 mg at 02/05/19 0823  . prazosin (MINIPRESS) capsule 1 mg  1 mg Oral Daily Cristofano, Worthy Rancher, MD   1 mg at 02/05/19 0823  . temazepam (RESTORIL) capsule 7.5 mg  7.5 mg Oral QHS PRN Cristofano, Worthy Rancher, MD   7.5 mg at 02/04/19 2126    Lab Results: No results found for this or any previous visit (from the past 48 hour(s)).  Blood Alcohol level:  Lab Results  Component Value Date   [facility] <10 01/28/2019   ETH <10 12/24/2018    Metabolic Disorder Labs: Lab Results   Component Value Date   HGBA1C 5.3 09/05/2016   MPG 105 09/05/2016   No results found for: PROLACTIN Lab Results  Component Value Date   CHOL 180 01/29/2019  TRIG 64 01/29/2019   HDL 46 01/29/2019   CHOLHDL 3.9 01/29/2019   VLDL 13 01/29/2019   LDLCALC 121 (H) 01/29/2019    Physical Findings: AIMS:  , ,  ,  ,    CIWA:    COWS:     Musculoskeletal: Strength & Muscle Tone: within normal limits Gait & Station: normal Patient leans: N/A  Psychiatric Specialty Exam: Physical Exam  Nursing note and vitals reviewed. Constitutional: He is oriented to person, place, and time. He appears well-developed and well-nourished.  HENT:  Head: Normocephalic and atraumatic.  Respiratory: Effort normal.  Neurological: He is alert and oriented to person, place, and time.    ROS  Blood pressure 122/77, pulse 63, temperature 97.8 F (36.6 C), temperature source Oral, resp. rate 17, height 6' (1.829 m), weight 86.6 kg, SpO2 97 %.Body mass index is 25.9 kg/m.  General Appearance: Casual  Eye Contact:  Good  Speech:  Normal Rate  Volume:  Normal  Mood:  Anxious  Affect:  Congruent  Thought Process:  Coherent and Descriptions of Associations: Intact  Orientation:  Full (Time, Place, and Person)  Thought Content:  Logical  Suicidal Thoughts:  No  Homicidal Thoughts:  No  Memory:  Immediate;   Fair Recent;   Fair Remote;   Fair  Judgement:  Intact  Insight:  Fair  Psychomotor Activity:  Increased  Concentration:  Concentration: Fair and Attention Span: Fair  Recall:  [employer] of Knowledge:  Fair  Language:  Good  Akathisia:  Negative  Handed:  Right  AIMS (if indicated):     Assets:  Desire for Improvement Resilience  ADL's:  Intact  Cognition:  WNL  Sleep:  Number of Hours: 6.75     Treatment Plan Summary: Daily contact with patient to assess and evaluate symptoms and progress in treatment, Medication management and Plan : Patient is seen and examined.  Patient is a  23 year old male with the above-stated past medical history who is seen in follow-up.   Diagnosis: #1 major depressive disorder, #2 ADHD, #3 impulse control disorder  Patient is seen in follow-up.  It sounds like he is doing better, but he also stated that if he was released he would harm his family.  The Department of Social Services is working on this, and the staff is trying to find placement.  The plan per Dr. Lasandra Beech note from yesterday stated they hope that discharge would occur sometime next week, and placement of a return to home was not mentioned.  He does continue on his Luvox Latuda as well as Trileptal.  I am not going to change his medications today, but if he continues to have some lability I may need to increase his Trileptal. 1.  Continue Ventolin HFA 1 puff every 4 hours as needed wheezing. 2.  Continue Adderall 20 mg p.o. twice daily for focus and attention. 3.  Continue Luvox 150 mg p.o. nightly for mood and anxiety as well as sleep. 4.  Continue Latuda 80 mg p.o. every afternoon AC for mood stability. 5.  Continue Trileptal 600 mg p.o. twice daily for mood stability as well as seizure control. 6.  Continue prazosin 1 mg p.o. daily for nightmares and flashbacks as well as impulse control. 7.  Continue temazepam 7.5 mg p.o. nightly as needed insomnia. 8.  Disposition planning-in progress.  Sharma Covert, MD 02/05/2019, 1:00 PM

## 2019-02-05 NOTE — [facility] Group Notes (Signed)
[facility] Group Notes:  (Nursing/MHT/Case Management/Adjunct)  Date:  02/05/2019  Time:  9:03 PM  Type of Therapy:  Group Therapy  Participation Level:  Did Not Attend  Summary of Progress/Problems:  Joel Arroyo 02/05/2019, 9:03 PM

## 2019-02-06 NOTE — B[facility] Group Notes (Signed)
LCSW Group Therapy Note 02/06/2019 1:15pm  Type of Therapy and Topic: Group Therapy: Feelings Around Returning Home & Establishing a Supportive Framework and Supporting Oneself When Supports Not Available  Participation Level: Active  Description of Group:  Patients first processed thoughts and feelings about upcoming discharge. These included fears of upcoming changes, lack of change, new living environments, judgements and expectations from others and overall stigma of mental health issues. The group then discussed the definition of a supportive framework, what that looks and feels like, and how do to discern it from an unhealthy non-supportive network. The group identified different types of supports as well as what to do when your family/friends are less than helpful or unavailable  Therapeutic Goals  1. Patient will identify one healthy supportive network that they can use at discharge. 2. Patient will identify one factor of a supportive framework and how to tell it from an unhealthy network. 3. Patient able to identify one coping skill to use when they do not have positive supports from others. 4. Patient will demonstrate ability to communicate their needs through discussion and/or role plays.  Summary of Patient Progress:  The patient reported he feels "excited." Pt engaged during group session. As patients processed their anxiety about discharge and described healthy supports patient shared he is ready to be discharge, however he indicated that he is nervous about not finding placement for him.  Patients identified at least one self-care tool they were willing to use after discharge.   Therapeutic Modalities Cognitive Behavioral Therapy Motivational Interviewing   Joel Arroyo  CUEBAS-COLON, LCSW 02/06/2019 12:22 PM

## 2019-02-06 NOTE — Progress Notes (Signed)
[facility]  02/06/2019 10:38 AM Joel Arroyo  MRN:  [id number] Subjective:  Patient is a 23 admitted on 11/67/20 when police were called after he ran away from the home after a fight with his parents.  The patient had taken a knife and cut himself superficially on both his wrist and told the admitting psychiatrist he was planning to hang himself from an overpass.  Objective: Patient is seen and examined.  Patient is a 23 year old male with the above-stated past psychiatric history is seen in follow-up.  He is doing well today.  He was out engaging, laughing and smiling with other patients.  He appears to be perhaps slightly hypomanic.  He denied any suicidal or homicidal ideation.  He denied any auditory or visual hallucinations.  His speech is not pressured.  He just looks mildly elevated.  His vital signs are stable, he is afebrile.  He slept 8.25 hours last night.  Review of the nursing notes all revealed that he was doing well.  Principal Problem: Major depressive disorder Diagnosis: Principal Problem:   Major depressive disorder Active Problems:   ADHD (attention deficit hyperactivity disorder)   Suicidal ideation   Homicidal ideation   Self-inflicted laceration of wrist (HCC)   Adjustment disorder with mixed disturbance of emotions and conduct  Total Time spent with patient: 15 minutes  Past Psychiatric History: See admission H&P  Past Medical History:  Past Medical History:  Diagnosis Date  . Anxiety   . Asthma   . Bipolar 1 disorder (HCC)   . Depression   . PTSD (post-traumatic stress disorder)   . Seizures (HCC)     Past Surgical History:  Procedure Laterality Date  . HERNIA REPAIR     Family History:  Family History  Problem Relation Age of Onset  . Kidney cancer Neg Hx   . Kidney disease Neg Hx   . Prostate cancer Neg Hx    Family Psychiatric  History: See admission H&P Social History:  Social History   Substance and Sexual Activity  Alcohol  Use No     Social History   Substance and Sexual Activity  Drug Use No    Social History   Socioeconomic History  . Marital status: Single    Spouse name: Not on file  . Number of children: Not on file  . Years of education: Not on file  . Highest education level: Not on file  Occupational History  . Not on file  Social Needs  . Financial resource strain: Not on file  . Food insecurity    Worry: Not on file    Inability: Not on file  . Transportation needs    Medical: Not on file    Non-medical: Not on file  Tobacco Use  . Smoking status: Current Every Day Smoker    Packs/day: 0.50    Types: Cigarettes  . Smokeless tobacco: Never Used  Substance and Sexual Activity  . Alcohol use: No  . Drug use: No  . Sexual activity: Not on file  Lifestyle  . Physical activity    Days per week: Not on file    Minutes per session: Not on file  . Stress: Not on file  Relationships  . Social Musicianconnections    Talks on phone: Not on file    Gets together: Not on file    Attends religious service: Not on file    Active member of club or organization: Not on file    Attends  meetings of clubs or organizations: Not on file    Relationship status: Not on file  Other Topics Concern  . Not on file  Social History Narrative  . Not on file   Additional Social History:                         Sleep: Good  Appetite:  Good  Current Medications: Current Facility-Administered Medications  Medication Dose Route Frequency Provider Last Rate Last Dose  . acetaminophen (TYLENOL) tablet 650 mg  650 mg Oral Q6H PRN Clement Sayres, MD   650 mg at 02/05/19 0823  . albuterol (VENTOLIN HFA) 108 (90 Base) MCG/ACT inhaler 1 puff  1 puff Inhalation Q4H PRN Cristofano, Worthy Rancher, MD   1 puff at 02/05/19 1538  . alum & mag hydroxide-simeth (MAALOX/MYLANTA) 200-200-20 MG/5ML suspension 30 mL  30 mL Oral Q4H PRN Cristofano, Paul A, MD      . amphetamine-dextroamphetamine (ADDERALL) tablet 20  mg  20 mg Oral BID WC Clapacs, Jackquline Denmark, MD   20 mg at 02/06/19 [phone]  . fluvoxaMINE (LUVOX) tablet 150 mg  150 mg Oral QHS Clapacs, Jackquline Denmark, MD   150 mg at 02/05/19 2152  . influenza vac split quadrivalent PF (FLUARIX) injection 0.5 mL  0.5 mL Intramuscular Tomorrow-1000 Clapacs, John T, MD      . lurasidone (LATUDA) tablet 80 mg  80 mg Oral Q supper Clapacs, Jackquline Denmark, MD   80 mg at 02/05/19 1646  . magnesium hydroxide (MILK OF MAGNESIA) suspension 30 mL  30 mL Oral Daily PRN Cristofano, Paul A, MD      . nicotine (NICODERM CQ - dosed in mg/24 hours) patch 14 mg  14 mg Transdermal Daily Clapacs, Jackquline Denmark, MD   14 mg at 02/06/19 0834  . Oxcarbazepine (TRILEPTAL) tablet 600 mg  600 mg Oral BID Clapacs, Jackquline Denmark, MD   600 mg at 02/06/19 5329  . pantoprazole (PROTONIX) EC tablet 20 mg  20 mg Oral Daily Cristofano, Worthy Rancher, MD   20 mg at 02/06/19 0834  . prazosin (MINIPRESS) capsule 1 mg  1 mg Oral Daily Cristofano, Worthy Rancher, MD   1 mg at 02/06/19 0833  . temazepam (RESTORIL) capsule 7.5 mg  7.5 mg Oral QHS PRN Cristofano, Worthy Rancher, MD   7.5 mg at 02/05/19 2152    Lab Results: No results found for this or any previous visit (from the past 48 hour(s)).  Blood Alcohol level:  Lab Results  Component Value Date   ETH <10 01/28/2019   ETH <10 12/24/2018    Metabolic Disorder Labs: Lab Results  Component Value Date   HGBA1C 5.3 09/05/2016   MPG 105 09/05/2016   No results found for: PROLACTIN Lab Results  Component Value Date   CHOL 180 01/29/2019   TRIG 64 01/29/2019   HDL 46 01/29/2019   CHOLHDL 3.9 01/29/2019   VLDL 13 01/29/2019   LDLCALC 121 (H) 01/29/2019    Physical Findings: AIMS:  , ,  ,  ,    CIWA:    COWS:     Musculoskeletal: Strength & Muscle Tone: within normal limits Gait & Station: normal Patient leans: N/A  Psychiatric Specialty Exam: Physical Exam  Nursing note and vitals reviewed. Constitutional: He is oriented to person, place, and time. He appears well-developed and  well-nourished.  HENT:  Head: Normocephalic and atraumatic.  Respiratory: Effort normal.  Neurological: He is alert and oriented to person,  place, and time.    ROS  Blood pressure 124/73, pulse 82, temperature 97.7 F (36.5 C), temperature source Oral, resp. rate 18, height 6' (1.829 m), weight 86.6 kg, SpO2 98 %.Body mass index is 25.9 kg/m.  General Appearance: Casual  Eye Contact:  Good  Speech:  Normal Rate  Volume:  Normal  Mood:  Euthymic  Affect:  Congruent  Thought Process:  Coherent and Descriptions of Associations: Intact  Orientation:  Full (Time, Place, and Person)  Thought Content:  Logical  Suicidal Thoughts:  No  Homicidal Thoughts:  No  Memory:  Immediate;   Fair Recent;   Fair Remote;   Fair  Judgement:  Intact  Insight:  Lacking  Psychomotor Activity:  Increased  Concentration:  Concentration: Fair and Attention Span: Fair  Recall:  [employer] of Knowledge:  Fair  Language:  Good  Akathisia:  Negative  Handed:  Right  AIMS (if indicated):     Assets:  Desire for Improvement Resilience  ADL's:  Intact  Cognition:  WNL  Sleep:  Number of Hours: 8.25     Treatment Plan Summary: Daily contact with patient to assess and evaluate symptoms and progress in treatment, Medication management and Plan : Patient is seen and examined.  Patient is a 23 year old male with the above-stated past psychiatric history who is seen in follow-up.   Diagnosis: #1 major depressive disorder, #2 ADHD, #3 impulse control disorder  Patient is seen in follow-up.  His mood is good today.  I am little concerned that he might be getting a bit hypomanic, but I will continue to watch him.  The issues with the Department of Social Services continue.  They are looking for placement.  No change in his medications at this point. 1.  Continue Ventolin HFA 1 puff every 4 hours as needed wheezing. 2.  Continue Adderall 20 mg p.o. twice daily for focus and attention. 3.  Continue Luvox 150 mg  p.o. nightly for mood and anxiety as well as sleep. 4.  Continue Latuda 80 mg p.o. every afternoon AC for mood stability. 5.  Continue Trileptal 600 mg p.o. twice daily for mood stability as well as seizure control. 6.  Continue prazosin 1 mg p.o. daily for nightmares and flashbacks as well as impulse control. 7.  Continue temazepam 7.5 mg p.o. nightly as needed insomnia. 8.  Disposition planning-in progress.  Sharma Covert, MD 02/06/2019, 10:38 AM

## 2019-02-06 NOTE — Plan of Care (Signed)
D- Patient alert and oriented. Patient presents in a pleasant mood on assessment stating that he slept good last night and had no complaints to voice to this writer at this time. Patient denies any signs/symptoms of depression, however, he reports an anxiety level of "3/10", stating that "my placement and what social services can do" is making him anxious. Patient also denies SI, HI, AVH, and pain at this time. Patient had no stated goals for today.   A- Scheduled medications administered to patient, per MD orders. Support and encouragement provided.  Routine safety checks conducted every 15 minutes.  Patient informed to notify staff with problems or concerns.  R- No adverse drug reactions noted. Patient contracts for safety at this time. Patient compliant with medications and treatment plan. Patient receptive, calm, and cooperative. Patient interacts well with others on the unit.  Patient remains safe at this time.  Problem: Education: Goal: Utilization of techniques to improve thought processes will improve Outcome: Progressing Goal: Knowledge of the prescribed therapeutic regimen will improve Outcome: Progressing   Problem: Coping: Goal: Coping ability will improve Outcome: Progressing Goal: Will verbalize feelings Outcome: Progressing   Problem: Safety: Goal: Ability to disclose and discuss suicidal ideas will improve Outcome: Progressing Goal: Ability to identify and utilize support systems that promote safety will improve Outcome: Progressing   Problem: Coping: Goal: Coping ability will improve Outcome: Progressing   Problem: Self-Concept: Goal: Ability to disclose and discuss suicidal ideas will improve Outcome: Progressing Goal: Will verbalize positive feelings about self Outcome: Progressing

## 2019-02-06 NOTE — Plan of Care (Signed)
Patient is excitable and happy , stating that his medicines are working well for him and has no physical complains , denies any Si/HI/AVH and has been coping well in the unit, maintaining safety, support is provided, no distress , 15 minutes check is maintained.Marland Kitchen

## 2019-02-06 NOTE — Plan of Care (Signed)
Patient is awaiting a routine discharge , but requires extensive family education and support , has not requested PRNs denies any thought of harm to self or others , sleep is restful at night, and has not complained , routine visual check is maintained.

## 2019-02-06 NOTE — Plan of Care (Signed)
  Problem: Education: Goal: Utilization of techniques to improve thought processes will improve 02/06/2019 0406 by Clemens Catholic, RN Outcome: Progressing 02/06/2019 0304 by Clemens Catholic, RN Outcome: Progressing Goal: Knowledge of the prescribed therapeutic regimen will improve 02/06/2019 0406 by Clemens Catholic, RN Outcome: Progressing 02/06/2019 0304 by Clemens Catholic, RN Outcome: Progressing   Problem: Coping: Goal: Coping ability will improve 02/06/2019 0406 by Clemens Catholic, RN Outcome: Progressing 02/06/2019 0304 by Clemens Catholic, RN Outcome: Progressing Goal: Will verbalize feelings 02/06/2019 0406 by Clemens Catholic, RN Outcome: Progressing 02/06/2019 0304 by Clemens Catholic, RN Outcome: Progressing   Problem: Safety: Goal: Ability to disclose and discuss suicidal ideas will improve 02/06/2019 0406 by Clemens Catholic, RN Outcome: Progressing 02/06/2019 0304 by Clemens Catholic, RN Outcome: Progressing Goal: Ability to identify and utilize support systems that promote safety will improve 02/06/2019 0406 by Clemens Catholic, RN Outcome: Progressing 02/06/2019 0304 by Clemens Catholic, RN Outcome: Progressing   Problem: Coping: Goal: Coping ability will improve 02/06/2019 0406 by Clemens Catholic, RN Outcome: Progressing 02/06/2019 0304 by Clemens Catholic, RN Outcome: Progressing   Problem: Self-Concept: Goal: Ability to disclose and discuss suicidal ideas will improve 02/06/2019 0406 by Clemens Catholic, RN Outcome: Progressing 02/06/2019 0304 by Clemens Catholic, RN Outcome: Progressing Goal: Will verbalize positive feelings about self 02/06/2019 0406 by Clemens Catholic, RN Outcome: Progressing 02/06/2019 0304 by Clemens Catholic, RN Outcome: Progressing

## 2019-02-07 MED ORDER — ALBUTEROL SULFATE HFA 108 (90 BASE) MCG/ACT IN AERS: 1.0000 | INHALATION_SPRAY | RESPIRATORY_TRACT | 1 refills | Status: AC | PRN

## 2019-02-07 MED ORDER — OXCARBAZEPINE 600 MG PO TABS: 600.0000 mg | ORAL_TABLET | Freq: Two times a day (BID) | ORAL | 1 refills | Status: AC

## 2019-02-07 MED ORDER — PRAZOSIN HCL 1 MG PO CAPS: 1.0000 mg | ORAL_CAPSULE | Freq: Every day | ORAL | 1 refills | Status: AC

## 2019-02-07 MED ORDER — PANTOPRAZOLE SODIUM 20 MG PO TBEC: 20.0000 mg | DELAYED_RELEASE_TABLET | Freq: Every day | ORAL | 1 refills | Status: AC

## 2019-02-07 MED ORDER — LURASIDONE HCL 80 MG PO TABS: 80.0000 mg | ORAL_TABLET | Freq: Every day | ORAL | 1 refills | Status: AC

## 2019-02-07 MED ORDER — AMPHETAMINE-DEXTROAMPHETAMINE 5 MG PO TABS: 20.0000 mg | ORAL_TABLET | Freq: Two times a day (BID) | ORAL | 0 refills | Status: DC

## 2019-02-07 MED ORDER — FLUVOXAMINE MALEATE 50 MG PO TABS: 150.0000 mg | ORAL_TABLET | Freq: Every day | ORAL | 1 refills | Status: AC

## 2019-02-07 NOTE — Progress Notes (Signed)
Recreation Therapy Notes  INPATIENT RECREATION TR PLAN  Patient Details Name: Joel Arroyo MRN: [id number] DOB: [birth year] Today's Date: 02/07/2019  Rec Therapy Plan Is patient appropriate for Therapeutic Recreation?: Yes Treatment times per week: at least 3 Estimated Length of Stay: 5-7 days TR Treatment/Interventions: Group participation (Comment)  Discharge Criteria Pt will be discharged from therapy if:: Discharged Treatment plan/goals/alternatives discussed and agreed upon by:: Patient/family  Discharge Summary Short term goals set: Patient will demonstrate improved communication skills by spontaneously contributing to 2 group discussions within 5 recreation therapy group sessions Short term goals met: Complete Progress toward goals comments: Groups attended Which groups?: Goal setting, Self-esteem, Other (Comment)(Emotions, Problem Solving, Relaxation) Reason goals not met: N/A Therapeutic equipment acquired: N/A Reason patient discharged from therapy: Discharge from hospital Pt/family agrees with progress & goals achieved: Yes Date patient discharged from therapy: 02/07/19   Deanette Tullius 02/07/2019, 3:05 PM

## 2019-02-07 NOTE — Plan of Care (Signed)
  Problem: Communication Goal: STG - Patient will demonstrate improved communication skills by spontaneously contributing to 2 group discussions within 5 recreation therapy group sessions Description: STG - Patient will demonstrate improved communication skills by spontaneously contributing to 2 group discussions within 5 recreation therapy group sessions Outcome: Completed/Met

## 2019-02-07 NOTE — Progress Notes (Signed)
Recreation Therapy Notes    Date: 02/07/2019  Time: 9:30 am  Location: Craft room  Behavioral response: Appropriate   Intervention Topic: Goals   Discussion/Intervention:   Group content on today was focused on goals. Patients described what goals are and how they define goals. Individuals expressed how they go about setting goals and reaching them. The group identified how important goals are and if they make short term goals to reach long term goals. Patients described how many goals they work on at a time and what affects them not reaching their goal. Individuals described how much time they put into planning and obtaining their goals. The group participated in the intervention "My Goal Board" and made personal goal boards to help them achieve their goal. Clinical Observations/Feedback:  Patient came to group and defined goals as failing or success towards something. He stated that in order to reach a goal you must have a plan. Participant identified his goal as being independent and living on his own.  Patient identified short term goals as easy to reach and long-term goals as more time and energy needed. Individual was social with peers and staff while participating in group.  Joel Arroyo LRT/CTRS         Joel Arroyo 02/07/2019 11:39 AM

## 2019-02-07 NOTE — BHH Group Notes (Signed)
LCSW Group Therapy Note   02/07/2019 10:54 AM   Type of Therapy and Topic:  Group Therapy:  Overcoming Obstacles   Participation Level:  Minimal   Description of Group:    In this group patients will be encouraged to explore what they see as obstacles to their own wellness and recovery. They will be guided to discuss their thoughts, feelings, and behaviors related to these obstacles. The group will process together ways to cope with barriers, with attention given to specific choices patients can make. Each patient will be challenged to identify changes they are motivated to make in order to overcome their obstacles. This group will be process-oriented, with patients participating in exploration of their own experiences as well as giving and receiving support and challenge from other group members.   Therapeutic Goals: 1. Patient will identify personal and current obstacles as they relate to admission. 2. Patient will identify barriers that currently interfere with their wellness or overcoming obstacles.  3. Patient will identify feelings, thought process and behaviors related to these barriers. 4. Patient will identify two changes they are willing to make to overcome these obstacles:      Summary of Patient Progress Pt was present at the beginning of group and was actively participating, but left after about 10-15 minutes and did not return. Pt identified placement as his current obstacle is placement and his family.      Therapeutic Modalities:   Cognitive Behavioral Therapy Solution Focused Therapy Motivational Interviewing Relapse Prevention Therapy  Evalina Field, MSW, LCSW Clinical Social Work 02/07/2019 10:54 AM

## 2019-02-07 NOTE — Progress Notes (Signed)
  [facility] Adult Case Management Discharge Plan :  Will you be returning to the same living situation after discharge:  No.  At discharge, do you have transportation home?: Yes,  Engineer, materials) will pick up Do you have the ability to pay for your medications: Yes,  Medicaid  Release of information consent forms completed and in the chart;  Patient's signature needed at discharge.  Patient to Follow up at: Follow-up [facility] Follow up.   Why: Your peer support Glenna Fellows) will follow up with you on Tuesday, November 17th and you are scheduled to speak with Ms. Forrest( therapist) on Wednesday, November 18th at [facility] via phone.  Contact information: [place] [address] [address] Follow up on 03/29/2019.   Why: You have an appointment scheduled with [name] on Tuesday 03/29/19 at 1:00PM. Thank You! Contact information: [place], [place] [address] Phone: [phone] Fax: [phone]          Next level of care provider has access to [place] and Suicide Prevention discussed: Yes,  stepmother/legal guardian     Has patient been referred to the Quitline?: Patient refused referral  Patient has been referred for addiction treatment: N/A  Yvette Rack, LCSW 02/07/2019, 2:24 PM

## 2019-02-07 NOTE — BHH Suicide Risk Assessment (Signed)
Joel Arroyo Discharge Suicide Risk Assessment   Principal Problem: Major depressive disorder Discharge Diagnoses: Principal Problem:   Major depressive disorder Active Problems:   ADHD (attention deficit hyperactivity disorder)   Suicidal ideation   Homicidal ideation   Self-inflicted laceration of wrist (HCC)   Adjustment disorder with mixed disturbance of emotions and conduct   Total Time spent with patient: 30 minutes  Musculoskeletal: Strength & Muscle Tone: within normal limits Gait & Station: normal Patient leans: N/A  Psychiatric Specialty Exam: Review of Systems  Constitutional: Negative.   HENT: Negative.   Eyes: Negative.   Respiratory: Negative.   Cardiovascular: Negative.   Gastrointestinal: Negative.   Musculoskeletal: Negative.   Skin: Negative.   Neurological: Negative.   Psychiatric/Behavioral: Negative.     Blood pressure 123/78, pulse 73, temperature 97.7 F (36.5 C), temperature source Oral, resp. rate 18, height 6' (1.829 m), weight 86.6 kg, SpO2 97 %.Body mass index is 25.9 kg/m.  General Appearance: Casual  Eye Contact::  Good  Speech:  Normal Rate409  Volume:  Normal  Mood:  Euthymic  Affect:  Congruent  Thought Process:  Goal Directed  Orientation:  Full (Time, Place, and Person)  Thought Content:  Logical  Suicidal Thoughts:  No  Homicidal Thoughts:  No  Memory:  Immediate;   Fair Recent;   Fair Remote;   Fair  Judgement:  Fair  Insight:  Fair  Psychomotor Activity:  Normal  Concentration:  Fair  Recall:  [employer] of Knowledge:Fair  Language: Fair  Akathisia:  No  Handed:  Right  AIMS (if indicated):     Assets:  Desire for Improvement Physical Health Resilience  Sleep:  Number of Hours: 6  Cognition: WNL  ADL's:  Intact   Mental Status Per Nursing Assessment::   On Admission:  Plan includes specific time, place, or method  Demographic Factors:  Male, Adolescent or young adult and Caucasian  Loss Factors: Legal  issues  Historical Factors: Impulsivity  Risk Reduction Factors:   Positive social support and Positive therapeutic relationship  Continued Clinical Symptoms:  Bipolar Disorder:   Mixed State  Cognitive Features That Contribute To Risk:  None    Suicide Risk:  Minimal: No identifiable suicidal ideation.  Patients presenting with no risk factors but with morbid ruminations; may be classified as minimal risk based on the severity of the depressive symptoms  Follow-up [facility] Follow up.   Why: Your peer support Joel Arroyo) will follow up with you on Tuesday, November 17th and you are scheduled to speak with Joel Arroyo( therapist) on Wednesday, November 18th at Joel Arroyo via phone.  Contact information: Joel Arroyo [id number] [address] Follow up on 03/29/2019.   Why: You have an appointment scheduled with Joel Arroyo on Tuesday 03/29/19 at 1:00PM. Thank You! Contact information: [place], [place] [address] Phone: [phone] Fax: [phone]          Plan Of Care/Follow-up recommendations:  Activity:  Activity as tolerated Diet:  Regular diet Other:  Follow-up with Joel Arroyo and outpatient treatment and cooperate with new guardians through Joel Arroyo 02/07/2019, 3:25 PM

## 2019-02-07 NOTE — BHH Counselor (Signed)
CSW spoke with Laurence Slate, APS worker, who reports if they do not secure a placement for the pt by 4pm today then they will house the pt at a hotel with an in home aide.

## 2019-02-07 NOTE — BHH Counselor (Signed)
CSW spoke with APS worker(Jazueline Olena Heckle) who reports she has reached out to 10 group homes in [place] but has not had any success with placement. She states the sheriff will serve the pt with protective order today. Per Ms. Olena Heckle, in the event they are not able to find a group home they will house him in a hotel. Ms. Olena Heckle states she will staff case with her supervisor and confirm arrangements with CSW.

## 2019-02-07 NOTE — Plan of Care (Signed)
  Problem: Education: Goal: Utilization of techniques to improve thought processes will improve 02/07/2019 0001 by Clemens Catholic, RN Outcome: Progressing 02/06/2019 2352 by Clemens Catholic, RN Outcome: Progressing Goal: Knowledge of the prescribed therapeutic regimen will improve 02/07/2019 0001 by Clemens Catholic, RN Outcome: Progressing 02/06/2019 2352 by Clemens Catholic, RN Outcome: Progressing   Problem: Coping: Goal: Coping ability will improve 02/07/2019 0001 by Clemens Catholic, RN Outcome: Progressing 02/06/2019 2352 by Clemens Catholic, RN Outcome: Progressing Goal: Will verbalize feelings 02/07/2019 0001 by Clemens Catholic, RN Outcome: Progressing 02/06/2019 2352 by Clemens Catholic, RN Outcome: Progressing   Problem: Safety: Goal: Ability to disclose and discuss suicidal ideas will improve 02/07/2019 0001 by Clemens Catholic, RN Outcome: Progressing 02/06/2019 2352 by Clemens Catholic, RN Outcome: Progressing Goal: Ability to identify and utilize support systems that promote safety will improve 02/07/2019 0001 by Clemens Catholic, RN Outcome: Progressing 02/06/2019 2352 by Clemens Catholic, RN Outcome: Progressing   Problem: Coping: Goal: Coping ability will improve 02/07/2019 0001 by Clemens Catholic, RN Outcome: Progressing 02/06/2019 2352 by Clemens Catholic, RN Outcome: Progressing   Problem: Self-Concept: Goal: Ability to disclose and discuss suicidal ideas will improve 02/07/2019 0001 by Clemens Catholic, RN Outcome: Progressing 02/06/2019 2352 by Clemens Catholic, RN Outcome: Progressing Goal: Will verbalize positive feelings about self 02/07/2019 0001 by Clemens Catholic, RN Outcome: Progressing 02/06/2019 2352 by Clemens Catholic, RN Outcome: Progressing

## 2019-02-07 NOTE — Plan of Care (Signed)
D- Patient alert and oriented. Patient presents in a pleasant mood on assessment stating that he slept ok last night and had complaints of leg pain. Patient rated his pain a "5/10", and he requested medication from this Probation officer. Patient denies depression, however, he endorsed anxiety, rating it a "4/10", stating the same things were making him feel this way. Patient reported yesterday "my placement and what social services can do" is what's making him anxious.  Patient also denies SI, HI, AVH, at this time. Patient had no stated goals for today.  A- Scheduled medications administered to patient, per MD orders. Support and encouragement provided.  Routine safety checks conducted every 15 minutes.  Patient informed to notify staff with problems or concerns.  R- No adverse drug reactions noted. Patient contracts for safety at this time. Patient compliant with medications and treatment plan. Patient receptive, calm, and cooperative. Patient interacts well with others on the unit.  Patient remains safe at this time.  Problem: Education: Goal: Utilization of techniques to improve thought processes will improve Outcome: Progressing Goal: Knowledge of the prescribed therapeutic regimen will improve Outcome: Progressing   Problem: Coping: Goal: Coping ability will improve Outcome: Progressing Goal: Will verbalize feelings Outcome: Progressing   Problem: Safety: Goal: Ability to disclose and discuss suicidal ideas will improve Outcome: Progressing Goal: Ability to identify and utilize support systems that promote safety will improve Outcome: Progressing   Problem: Coping: Goal: Coping ability will improve Outcome: Progressing   Problem: Self-Concept: Goal: Ability to disclose and discuss suicidal ideas will improve Outcome: Progressing Goal: Will verbalize positive feelings about self Outcome: Progressing
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
## 2019-02-07 NOTE — Progress Notes (Signed)
Patient ID: Joel Arroyo, male   DOB: 09-21-1995, 23 y.o.   MRN: [id number]   Discharge Note:  Patient denies SI/HI/AVH at this time. Discharge instructions, AVS, prescriptions, and transition record gone over with patient. Patient agrees to comply with medication management, follow-up visit, and outpatient therapy. Patient belongings returned to patient. Patient questions and concerns addressed and answered. Patient ambulatory off unit. Patient discharged to APS custody, with Verdie Drown, and APS worker.

## 2019-02-11 NOTE — Progress Notes (Signed)
   02/04/19 1525  PPD Results  Does patient have an induration at the injection site? No

## 2019-02-21 ENCOUNTER — Other Ambulatory Visit: Payer: Self-pay | Admitting: Psychiatry

## 2019-02-21 MED ORDER — AMPHETAMINE-DEXTROAMPHETAMINE 20 MG PO TABS: 20.0000 mg | ORAL_TABLET | Freq: Two times a day (BID) | ORAL | 0 refills | Status: DC

## 2019-03-25 ENCOUNTER — Other Ambulatory Visit: Payer: Self-pay | Admitting: Psychiatry

## 2019-06-06 ENCOUNTER — Emergency Department: Payer: Medicaid Other

## 2019-06-06 ENCOUNTER — Encounter: Payer: Self-pay | Admitting: Emergency Medicine

## 2019-06-06 ENCOUNTER — Emergency Department
Admission: EM | Admit: 2019-06-06 | Discharge: 2019-06-06 | Disposition: A | Discharge status: Home / Self Care | Payer: Medicaid Other | Attending: Emergency Medicine | Admitting: Emergency Medicine

## 2019-06-06 ENCOUNTER — Other Ambulatory Visit: Payer: Self-pay

## 2019-06-06 DIAGNOSIS — R0600 Dyspnea, unspecified: Secondary | ICD-10-CM | POA: Diagnosis not present

## 2019-06-06 DIAGNOSIS — R0602 Shortness of breath: Secondary | ICD-10-CM | POA: Diagnosis present

## 2019-06-06 DIAGNOSIS — F17210 Nicotine dependence, cigarettes, uncomplicated: Secondary | ICD-10-CM | POA: Diagnosis not present

## 2019-06-06 DIAGNOSIS — J45909 Unspecified asthma, uncomplicated: Secondary | ICD-10-CM | POA: Diagnosis not present

## 2019-06-06 LAB — BASIC METABOLIC PANEL
Anion gap: 9 (ref 5–15)
BUN: 17 mg/dL (ref 6–20)
CO2: 27 mmol/L (ref 22–32)
Calcium: 9 mg/dL (ref 8.9–10.3)
Chloride: 103 mmol/L (ref 98–111)
Creatinine, Ser: 1.01 mg/dL (ref 0.61–1.24)
GFR calc Af Amer: >60 mL/min (ref >60)
GFR calc non Af Amer: >60 mL/min (ref >60)
Glucose, Bld: 102 mg/dL — ABNORMAL HIGH (ref 70–99)
Potassium: 3.6 mmol/L (ref 3.5–5.1)
Sodium: 139 mmol/L (ref 135–145)

## 2019-06-06 LAB — CBC
HCT: 45.9 % (ref 39.0–52.0)
Hemoglobin: 15.4 g/dL (ref 13.0–17.0)
MCH: 31.4 pg (ref 26.0–34.0)
MCHC: 33.6 g/dL (ref 30.0–36.0)
MCV: 93.7 fL (ref 80.0–100.0)
Platelets: 227 10*3/uL (ref 150–400)
RBC: 4.9 MIL/uL (ref 4.22–5.81)
RDW: 12.3 % (ref 11.5–15.5)
WBC: 7 10*3/uL (ref 4.0–10.5)
nRBC: 0 % (ref 0.0–0.2)

## 2019-06-06 LAB — TROPONIN I (HIGH SENSITIVITY): Troponin I (High Sensitivity): <2 ng/L (ref <18)

## 2019-06-06 NOTE — ED Triage Notes (Addendum)
Patient brought in by ems from a group home. Patient and ems unsure of the name of the group home. Patient states that when he woke up he was having trouble breathing and it felt like someone was choking him. Patient has a history of asthma but states that this feels different. Patient states that his symptoms started after his pseudo seizure on Friday night. He state generalized body aches, fatigue and poor appetite.

## 2019-06-06 NOTE — ED Provider Notes (Signed)
[facility] Emergency Department Provider Note       Time seen: ----------------------------------------- 10:32 AM on 06/06/2019 -----------------------------------------  I have reviewed the triage vital signs and the nursing notes.  HISTORY   Chief Complaint Shortness of Breath   HPI Joel Arroyo is a 24 y.o. male with a history of anxiety, asthma, bipolar disorder, depression, PTSD who presents to the ED for multiple complaints.  Patient states he had a pseudoseizure and was having shortness of breath afterwards.  He is also had some body aches, fatigue and poor appetite.  Currently he denies any complaints.  Past Medical History:  Diagnosis Date  . Anxiety   . Asthma   . Bipolar 1 disorder (HCC)   . Depression   . PTSD (post-traumatic stress disorder)   . Seizures [name]     Patient Active Problem List   Diagnosis Date Noted  . Major depressive disorder 01/29/2019  . 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 Major depression, recurrent (HCC) 09/05/2018  . MDD (major depressive disorder), severe (HCC) 08/17/2018  . Self-inflicted laceration of wrist (HCC) 10/07/2017  . Cannabis abuse 10/07/2017  . Seizure-like activity (HCC)   . Psychosis (HCC)   . Seizures (HCC) 09/06/2016  . Suicidal ideation 09/15/2015  . Homicidal ideation 09/15/2015  . ADHD (attention deficit hyperactivity disorder) 09/14/2015    Past Surgical History:  Procedure Laterality Date  . HERNIA REPAIR      Allergies Patient has no known allergies.  Social History Social History   Tobacco Use  . Smoking status: Current Every Day Smoker    Packs/day: 0.50    Types: Cigarettes  . Smokeless tobacco: Never Used  Substance Use Topics  . Alcohol use: No  . Drug use: No    Review of Systems Constitutional: Negative for fever. Cardiovascular: Negative for chest pain. Respiratory: Positive recent shortness of  breath Gastrointestinal: Negative for abdominal pain, vomiting and diarrhea. Musculoskeletal: Positive for aches Skin: Negative for rash. Neurological: Negative for headaches, focal weakness or numbness.  Positive for pseudoseizure  All systems negative/normal/unremarkable except as stated in the HPI  ____________________________________________   PHYSICAL EXAM:  VITAL SIGNS: ED Triage Vitals  Enc Vitals Group     BP 06/06/19 0648 119/74     Pulse Rate 06/06/19 0648 74     Resp 06/06/19 0648 18     Temp 06/06/19 0648 98 F (36.7 C)     Temp Source 06/06/19 0648 Oral     SpO2 06/06/19 0648 97 %     Weight 06/06/19 0650 190 lb (86.2 kg)     Height 06/06/19 0650 6\' 1"  (1.854 m)     Head Circumference --      Peak Flow --      Pain Score 06/06/19 0650 2     Pain Loc --      Pain Edu? --      Excl. in GC? --     Constitutional: Alert and oriented.  No acute distress Eyes: Conjunctivae are normal. Normal extraocular movements. ENT      Head: Normocephalic and atraumatic.      Nose: No congestion/rhinnorhea.      Mouth/Throat: Mucous membranes are moist.      Neck: No stridor. Cardiovascular: Normal rate, regular rhythm. No murmurs, rubs, or gallops. Respiratory: Normal respiratory effort without tachypnea nor retractions. Breath sounds are clear and equal bilaterally. No wheezes/rales/rhonchi. Gastrointestinal: Soft and nontender. Normal bowel sounds Musculoskeletal: Nontender with  normal range of motion in extremities. No lower extremity tenderness nor edema. Neurologic:  Normal speech and language. No gross focal neurologic deficits are appreciated.  Skin:  Skin is warm, dry and intact. No rash noted. Psychiatric: Mood and affect are normal. Speech and behavior are normal.  ____________________________________________  EKG: Interpreted by me.  Sinus rhythm with rate of 63 bpm, rightward axis, normal QT  ____________________________________________  ED COURSE:  As  part of my medical decision making, I reviewed the following data within the [place] [place] History obtained from family if available, nursing notes, old chart and ekg, as well as notes from prior ED visits. Patient presented for multiple complaints, we will assess with labs and imaging as indicated at this time.   Procedures  [name] was evaluated in Emergency Department on 06/06/2019 for the symptoms described in the history of present illness. He was evaluated in the context of the global COVID-19 pandemic, which necessitated consideration that the patient might be at risk for infection with the SARS-CoV-2 virus that causes COVID-19. Institutional protocols and algorithms that pertain to the evaluation of patients at risk for COVID-19 are in a state of rapid change based on information released by regulatory bodies including the CDC and federal and state organizations. These policies and algorithms were followed during the patient's care in the ED.  ____________________________________________   LABS (pertinent positives/negatives)  Labs Reviewed  BASIC METABOLIC PANEL - Abnormal; Notable for the following components:      Result Value   Glucose, Bld 102 (*)    All other components within normal limits  CBC  TROPONIN I (HIGH SENSITIVITY)    RADIOLOGY  CXR IMPRESSION: No acute process in the chest.  ____________________________________________   DIFFERENTIAL DIAGNOSIS   Pseudoseizure, somatizations disorder, dehydration, electrolyte abnormality, panic attack  FINAL ASSESSMENT AND PLAN  Medical screening exam   Plan: The patient had presented for medical screening exam. Patient's labs are reassuring. Patient's imaging not reveal any acute process.  Overall he looks well with normal vital signs.  He is cleared for outpatient follow-up.   Laurence Aly, MD    Note: This note was generated in part or whole with voice recognition software. Voice  recognition is usually quite accurate but there are transcription errors that can and very often do occur. I apologize for any typographical errors that were not detected and corrected.     Earleen Newport, MD 06/06/19 1034

## 2019-06-06 NOTE — ED Triage Notes (Signed)
First Nurse: patient brought in by ems from home. Per ems patient had a pseudo seizure on Saturday and has not felt well since. Per ems patient has vomited times two since and started feeling short of breath this morning. EMS reports clear lung sounds, bp 145/98 sats 97%.

## 2020-03-13 ENCOUNTER — Ambulatory Visit: Payer: Medicaid Other | Attending: Critical Care Medicine

## 2020-03-13 DIAGNOSIS — Z23 Encounter for immunization: Secondary | ICD-10-CM

## 2020-03-13 NOTE — Progress Notes (Signed)
   Covid-19 Vaccination Clinic  Name:  Joel Arroyo    MRN: [id number] DOB: 10-05-1995  03/13/2020  [name] was observed post Covid-19 immunization for 15 min  without incident. She was provided with Vaccine Information Sheet and instruction to access the V-Safe system.   [name] was instructed to call 911 with any severe reactions post vaccine: Marland Kitchen Difficulty breathing  . Swelling of face and throat  . A fast heartbeat  . A bad rash all over body  . Dizziness and weakness   Immunizations Administered    Name Date Dose VIS Date Route   Moderna Covid-19 Booster Vaccine 03/13/2020 10:58 AM 0.25 mL 01/11/2020 Intramuscular   Manufacturer: Gala Murdoch   Lot: [id number]   NDC: 50354-656-81

## 2020-06-15 ENCOUNTER — Other Ambulatory Visit: Payer: Self-pay

## 2020-06-15 ENCOUNTER — Emergency Department
Admission: EM | Admit: 2020-06-15 | Discharge: 2020-06-15 | Disposition: A | Discharge status: Home / Self Care | Payer: Medicaid Other | Attending: Emergency Medicine | Admitting: Emergency Medicine

## 2020-06-15 DIAGNOSIS — F17210 Nicotine dependence, cigarettes, uncomplicated: Secondary | ICD-10-CM | POA: Insufficient documentation

## 2020-06-15 DIAGNOSIS — G4089 Other seizures: Secondary | ICD-10-CM | POA: Insufficient documentation

## 2020-06-15 DIAGNOSIS — R569 Unspecified convulsions: Secondary | ICD-10-CM | POA: Diagnosis present

## 2020-06-15 DIAGNOSIS — F445 Conversion disorder with seizures or convulsions: Secondary | ICD-10-CM

## 2020-06-15 DIAGNOSIS — Z8669 Personal history of other diseases of the nervous system and sense organs: Secondary | ICD-10-CM | POA: Diagnosis not present

## 2020-06-15 DIAGNOSIS — J45909 Unspecified asthma, uncomplicated: Secondary | ICD-10-CM | POA: Insufficient documentation

## 2020-06-15 MED ORDER — KETOROLAC TROMETHAMINE 30 MG/ML IJ SOLN
30.0000 mg | Freq: Once | INTRAMUSCULAR | Status: AC
  Administered 2020-06-15: 30 mg via INTRAVENOUS
  Filled 2020-06-15: qty 1

## 2020-06-15 NOTE — ED Provider Notes (Signed)
[facility] Emergency Department Provider Note   ____________________________________________    I have reviewed the triage vital signs and the nursing notes.   HISTORY  Chief Complaint Seizures     HPI Joel Arroyo is a 25 y.o. 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  He reports that he had a pseudoseizure because he frequently has them when he is dealing with a high stress situation.  He was feeling very stressed because of having to move today when he had a pseudoseizure.  He tried to inform the people that he was with but this is typical and there is nothing to be done but they called EMS regardless.  He feels well at this point, he does have a global mild headache which he reports is typical after a seizure.  No injuries reported, no focal weakness.  Past Medical History:  Diagnosis Date  . Anxiety   . Asthma   . Bipolar 1 disorder (HCC)   . Depression   . PTSD (post-traumatic stress disorder)   . Seizures [facility])     Patient Active Problem List   Diagnosis Date Noted  . Major depressive disorder 01/29/2019  . 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 Major depression, recurrent (HCC) 09/05/2018  . MDD (major depressive disorder), severe (HCC) 08/17/2018  . Self-inflicted laceration of wrist (HCC) 10/07/2017  . Cannabis abuse 10/07/2017  . Seizure-like activity (HCC)   . Psychosis (HCC)   . Seizures (HCC) 09/06/2016  . Suicidal ideation 09/15/2015  . Homicidal ideation 09/15/2015  . ADHD (attention deficit hyperactivity disorder) 09/14/2015    Past Surgical History:  Procedure Laterality Date  . HERNIA REPAIR      Prior to Admission medications   Medication Sig Start Date End Date Taking? Authorizing Provider  albuterol (VENTOLIN HFA) 108 (90 Base) MCG/ACT inhaler Inhale 1 puff into the lungs every 4 (four) hours as needed (SOB). 02/07/19    Clapacs, Jackquline Denmark, MD  amphetamine-dextroamphetamine (ADDERALL) 20 MG tablet Take 1 tablet (20 mg total) by mouth 2 (two) times daily with a meal. 02/21/19   Clapacs, Jackquline Denmark, MD  amphetamine-dextroamphetamine (ADDERALL) 5 MG tablet Take 4 tablets (20 mg total) by mouth 2 (two) times daily with breakfast and lunch. 02/08/19   Clapacs, Jackquline Denmark, MD  fluvoxaMINE (LUVOX) 50 MG tablet Take 3 tablets (150 mg total) by mouth at bedtime. 02/07/19   Clapacs, Jackquline Denmark, MD  lurasidone (LATUDA) 80 MG TABS tablet Take 1 tablet (80 mg total) by mouth daily with supper. 02/07/19   Clapacs, Jackquline Denmark, MD  oxcarbazepine (TRILEPTAL) 600 MG tablet Take 1 tablet (600 mg total) by mouth 2 (two) times daily. 02/07/19   Clapacs, Jackquline Denmark, MD  pantoprazole (PROTONIX) 20 MG tablet Take 1 tablet (20 mg total) by mouth daily. 02/07/19   Clapacs, Jackquline Denmark, MD  prazosin (MINIPRESS) 1 MG capsule Take 1 capsule (1 mg total) by mouth daily. 02/07/19   Clapacs, Jackquline Denmark, MD     Allergies Patient has no known allergies.  Family History  Problem Relation Age of Onset  . Kidney cancer Neg Hx   . Kidney disease Neg Hx   . Prostate cancer Neg Hx     Social History Social History   Tobacco Use  . Smoking status: Current Every Day Smoker    Packs/day: 0.50    Types: Cigarettes  . Smokeless tobacco: Never Used  Substance  Use Topics  . Alcohol use: No  . Drug use: No    Review of Systems  Constitutional: No fever/chills Eyes: No visual changes.  ENT: No sore throat. Cardiovascular: Denies chest pain. Respiratory: Denies shortness of breath. Gastrointestinal: No abdominal pain.  No nausea, no vomiting.   Genitourinary: Negative for dysuria. Musculoskeletal: Negative for back pain. Skin: Negative for rash. Neurological: As above   ____________________________________________   PHYSICAL EXAM:  VITAL SIGNS: ED Triage Vitals  Enc Vitals Group     BP 06/15/20 1345 125/88     Pulse Rate 06/15/20 1345 82     Resp 06/15/20  1345 18     Temp 06/15/20 1345 98.1 F (36.7 C)     Temp Source 06/15/20 1345 Oral     SpO2 06/15/20 1345 95 %     Weight 06/15/20 1346 96.2 kg (212 lb)     Height 06/15/20 1346 1.829 m (6')     Head Circumference --      Peak Flow --      Pain Score 06/15/20 1346 8     Pain Loc --      Pain Edu? --      Excl. in GC? --     Constitutional: Alert and oriented. No acute distress. Pleasant and interactive Eyes: Conjunctivae are normal.  PERRLA, EOMI Head: Atraumatic. Nose: No congestion/rhinnorhea. Mouth/Throat: Mucous membranes are moist.  No tongue laceration Neck:  Painless ROM Cardiovascular: Normal rate, regular rhythm. Grossly normal heart sounds.  Good peripheral circulation. Respiratory: Normal respiratory effort.  No retractions. Lungs CTAB. Gastrointestinal: Soft and nontender. No distention.  No CVA tenderness.  Musculoskeletal: Warm and well perfused Neurologic:  Normal speech and language. No gross focal neurologic deficits are appreciated.  Skin:  Skin is warm, dry and intact. No rash noted. Psychiatric: Mood and affect are normal. Speech and behavior are normal.  ____________________________________________   LABS (all labs ordered are listed, but only abnormal results are displayed)  Labs Reviewed - No data to display ____________________________________________  EKG  ED ECG REPORT I, Jene Every, the attending physician, personally viewed and interpreted this ECG.  Date: 06/15/2020  Rhythm: normal sinus rhythm QRS Axis: normal Intervals: normal ST/T Wave abnormalities: normal Narrative Interpretation: no evidence of acute ischemia  ____________________________________________  RADIOLOGY  None ____________________________________________   PROCEDURES  Procedure(s) performed: No  Procedures   Critical Care performed: No ____________________________________________   INITIAL IMPRESSION / ASSESSMENT AND PLAN / ED COURSE  Pertinent  labs & imaging results that were available during my care of the patient were reviewed by me and considered in my medical decision making (see chart for details).  Patient well-appearing in no acute distress.  Only complaint at this time is mild headache, globally.  No neuro deficits, reassuring neuro exam.  No injury, no reported fall.  Well-documented history of nonepileptic seizures, he clearly states to me that this was related to stress today.  He feels well he does not take anything for seizures.  We will give IV Toradol, observe in the department and if no further seizure-like activity appropriate for discharge, discussed with his mother as well who agrees with this plan.   ----------------------------------------- 2:37 PM on 06/15/2020 -----------------------------------------  Patient remains well-appearing, no further seizure activity, appropriate discharge at this time given his history.    ____________________________________________   FINAL CLINICAL IMPRESSION(S) / ED DIAGNOSES  Final diagnoses:  Pseudoseizure        Note:  This document was prepared using Dragon voice  recognition software and may include unintentional dictation errors.   Jene Every, MD 06/15/20 [phone]

## 2020-06-15 NOTE — ED Triage Notes (Signed)
Pt brought in via EMS for seizure.  States seizure was witnessed by friends, lasting approx 1 min.  States pt was postictal for approx 2+-3 min afterwards.  CBG 133.  VSS.  Pt AAO x 4 during triage.  C/O headache 8/10.   Pt  Has history of seizures but does not take medications.

## 2020-07-23 ENCOUNTER — Encounter: Payer: Self-pay | Admitting: *Deleted

## 2020-07-23 ENCOUNTER — Emergency Department
Admission: EM | Admit: 2020-07-23 | Discharge: 2020-07-24 | Disposition: A | Discharge status: Home / Self Care | Payer: Medicaid Other | Attending: Emergency Medicine | Admitting: Emergency Medicine

## 2020-07-23 ENCOUNTER — Other Ambulatory Visit: Payer: Self-pay

## 2020-07-23 DIAGNOSIS — F17210 Nicotine dependence, cigarettes, uncomplicated: Secondary | ICD-10-CM | POA: Insufficient documentation

## 2020-07-23 DIAGNOSIS — F445 Conversion disorder with seizures or convulsions: Secondary | ICD-10-CM

## 2020-07-23 DIAGNOSIS — J45909 Unspecified asthma, uncomplicated: Secondary | ICD-10-CM | POA: Diagnosis not present

## 2020-07-23 DIAGNOSIS — Z79899 Other long term (current) drug therapy: Secondary | ICD-10-CM | POA: Diagnosis not present

## 2020-07-23 DIAGNOSIS — R569 Unspecified convulsions: Secondary | ICD-10-CM | POA: Diagnosis present

## 2020-07-23 LAB — CBC
HCT: 46.2 % (ref 39.0–52.0)
Hemoglobin: 16.5 g/dL (ref 13.0–17.0)
MCH: 32.6 pg (ref 26.0–34.0)
MCHC: 35.7 g/dL (ref 30.0–36.0)
MCV: 91.3 fL (ref 80.0–100.0)
Platelets: 288 10*3/uL (ref 150–400)
RBC: 5.06 MIL/uL (ref 4.22–5.81)
RDW: 12.7 % (ref 11.5–15.5)
WBC: 8.8 10*3/uL (ref 4.0–10.5)
nRBC: 0 % (ref 0.0–0.2)

## 2020-07-23 LAB — BASIC METABOLIC PANEL
Anion gap: 7 (ref 5–15)
BUN: 14 mg/dL (ref 6–20)
CO2: 25 mmol/L (ref 22–32)
Calcium: 9.1 mg/dL (ref 8.9–10.3)
Chloride: 106 mmol/L (ref 98–111)
Creatinine, Ser: 1.07 mg/dL (ref 0.61–1.24)
GFR, Estimated: >60 mL/min (ref >60)
Glucose, Bld: 101 mg/dL — ABNORMAL HIGH (ref 70–99)
Potassium: 4.3 mmol/L (ref 3.5–5.1)
Sodium: 138 mmol/L (ref 135–145)

## 2020-07-23 NOTE — ED Triage Notes (Signed)
Pt brought in via ems from group home with pseudoseizure tonight.  Pt alert on arrival  Pt has a headache.  No n/v/  Iv in place. Speech clear.

## 2020-07-23 NOTE — ED Notes (Signed)
Joel Arroyo ok with patient riding in cab to group home. Staff aware that patient will arrive via cab. Charge nurse notified and will provide with cab voucher.

## 2020-07-23 NOTE — ED Provider Notes (Signed)
[facility] Emergency Department Provider Note   ____________________________________________   Event Date/Time   First MD Initiated Contact with Patient 07/23/20 2252     (approximate)  I have reviewed the triage vital signs and the nursing notes.   HISTORY  Chief Complaint Seizures    HPI Joel Arroyo is a 25 y.o. adult genetic male with the below stated past medical history who presents via EMS with "pseudoseizures" earlier tonight.  Patient is awake, oriented, and only complaining of a headache at this time.  Patient denies any other complaints.  Per EMS patient had shaking but had his eyes open the entire time and was looking at the providers.  Patient currently denies any vision changes, tinnitus, difficulty speaking, facial droop, sore throat, chest pain, shortness of breath, abdominal pain, nausea/vomiting/diarrhea, dysuria, or weakness/numbness/paresthesias in any extremity         Past Medical History:  Diagnosis Date  . Anxiety   . Asthma   . Bipolar 1 disorder (HCC)   . Depression   . PTSD (post-traumatic stress disorder)   . Seizures [facility])     Patient Active Problem List   Diagnosis Date Noted  . Major depressive disorder 01/29/2019  . 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 Major depression, recurrent (HCC) 09/05/2018  . MDD (major depressive disorder), severe (HCC) 08/17/2018  . Self-inflicted laceration of wrist (HCC) 10/07/2017  . Cannabis abuse 10/07/2017  . Seizure-like activity (HCC)   . Psychosis (HCC)   . Seizures (HCC) 09/06/2016  . Suicidal ideation 09/15/2015  . Homicidal ideation 09/15/2015  . ADHD (attention deficit hyperactivity disorder) 09/14/2015    Past Surgical History:  Procedure Laterality Date  . HERNIA REPAIR      Prior to Admission medications   Medication Sig Start Date End Date Taking? Authorizing Provider  albuterol (VENTOLIN HFA) 108 (90 Base)  MCG/ACT inhaler Inhale 1 puff into the lungs every 4 (four) hours as needed (SOB). 02/07/19   Clapacs, Jackquline Denmark, MD  amphetamine-dextroamphetamine (ADDERALL) 20 MG tablet Take 1 tablet (20 mg total) by mouth 2 (two) times daily with a meal. 02/21/19   Clapacs, Jackquline Denmark, MD  amphetamine-dextroamphetamine (ADDERALL) 5 MG tablet Take 4 tablets (20 mg total) by mouth 2 (two) times daily with breakfast and lunch. 02/08/19   Clapacs, Jackquline Denmark, MD  fluvoxaMINE (LUVOX) 50 MG tablet Take 3 tablets (150 mg total) by mouth at bedtime. 02/07/19   Clapacs, Jackquline Denmark, MD  lurasidone (LATUDA) 80 MG TABS tablet Take 1 tablet (80 mg total) by mouth daily with supper. 02/07/19   Clapacs, Jackquline Denmark, MD  oxcarbazepine (TRILEPTAL) 600 MG tablet Take 1 tablet (600 mg total) by mouth 2 (two) times daily. 02/07/19   Clapacs, Jackquline Denmark, MD  pantoprazole (PROTONIX) 20 MG tablet Take 1 tablet (20 mg total) by mouth daily. 02/07/19   Clapacs, Jackquline Denmark, MD  prazosin (MINIPRESS) 1 MG capsule Take 1 capsule (1 mg total) by mouth daily. 02/07/19   Clapacs, Jackquline Denmark, MD    Allergies Patient has no known allergies.  Family History  Problem Relation Age of Onset  . Kidney cancer Neg Hx   . Kidney disease Neg Hx   . Prostate cancer Neg Hx     Social History Social History   Tobacco Use  . Smoking status: Current Every Day Smoker    Packs/day: 0.50    Types: Cigarettes  . Smokeless tobacco: Never Used  Substance Use  Topics  . Alcohol use: No  . Drug use: No    Review of Systems Constitutional: No fever/chills Eyes: No visual changes. ENT: No sore throat. Cardiovascular: Denies chest pain. Respiratory: Denies shortness of breath. Gastrointestinal: No abdominal pain.  No nausea, no vomiting.  No diarrhea. Genitourinary: Negative for dysuria. Musculoskeletal: Negative for acute arthralgias Skin: Negative for rash. Neurological: Negative for headaches, weakness/numbness/paresthesias in any extremity Psychiatric: Negative for  suicidal ideation/homicidal ideation   ____________________________________________   PHYSICAL EXAM:  VITAL SIGNS: ED Triage Vitals  Enc Vitals Group     BP 07/23/20 2043 119/82     Pulse Rate 07/23/20 2043 87     Resp 07/23/20 2043 20     Temp 07/23/20 2043 98.6 F (37 C)     Temp Source 07/23/20 2043 Oral     SpO2 07/23/20 2043 95 %     Weight 07/23/20 2044 212 lb (96.2 kg)     Height 07/23/20 2044 6' (1.829 m)     Head Circumference --      Peak Flow --      Pain Score 07/23/20 2044 10     Pain Loc --      Pain Edu? --      Excl. in GC? --    Constitutional: Alert and oriented. Well appearing and in no acute distress. Eyes: Conjunctivae are normal. PERRL. Head: Atraumatic. Nose: No congestion/rhinnorhea. Mouth/Throat: Mucous membranes are moist. Neck: No stridor Cardiovascular: Grossly normal heart sounds.  Good peripheral circulation. Respiratory: Normal respiratory effort.  No retractions. Gastrointestinal: Soft and nontender. No distention. Musculoskeletal: No obvious deformities Neurologic:  Normal speech and language. No gross focal neurologic deficits are appreciated. Skin:  Skin is warm and dry. No rash noted. Psychiatric: Mood and affect are normal. Speech and behavior are normal.  ____________________________________________   LABS (all labs ordered are listed, but only abnormal results are displayed)  Labs Reviewed  BASIC METABOLIC PANEL - Abnormal; Notable for the following components:      Result Value   Glucose, Bld 101 (*)    All other components within normal limits  CBC   ____________________________________________  EKG  PROCEDURES  Procedure(s) performed (including Critical Care):  Procedures   ____________________________________________   INITIAL IMPRESSION / ASSESSMENT AND PLAN / ED COURSE  As part of my medical decision making, I reviewed the following data within the electronic MEDICAL RECORD NUMBER Nursing notes reviewed and  incorporated, Labs reviewed, EKG interpreted, Old chart reviewed, Radiograph reviewed and Notes from prior ED visits reviewed and incorporated        Patient presents after recent seizure/pseudoseizure episode.  Patient did not have slow return to baseline mental and physical function per EMS. No immunosuppresion hx and had no preceding fever. No history of alcohol abuse or suspicion for toxin ingestion. Unlikely stroke, syncope. Unlikely infectious etiology. No preceding trauma.  Workup: EKG, BMP, POCT glucose (pregnancy test if male) and CT Brain.  Field Interventions: None ED Interventions: None Disposition: Discharge home with primary care follow up in next 24-48 hours.      ____________________________________________   FINAL CLINICAL IMPRESSION(S) / ED DIAGNOSES  Final diagnoses:  Pseudoseizure     ED Discharge Orders    None       Note:  This document was prepared using Dragon voice recognition software and may include unintentional dictation errors.   Merwyn Katos, MD 07/23/20 [phone]

## 2020-07-24 NOTE — ED Notes (Signed)
Joel Arroyo, Adult social worker on call notified of patient being discharged back to group home.

## 2020-10-17 IMAGING — CR DG CHEST 2V
2 series · 2 of 2 positions shown · non-contrast
Comparison: 7581

CLINICAL DATA: Shortness of breath

EXAM:
CHEST - 2 VIEW

[chest pa]
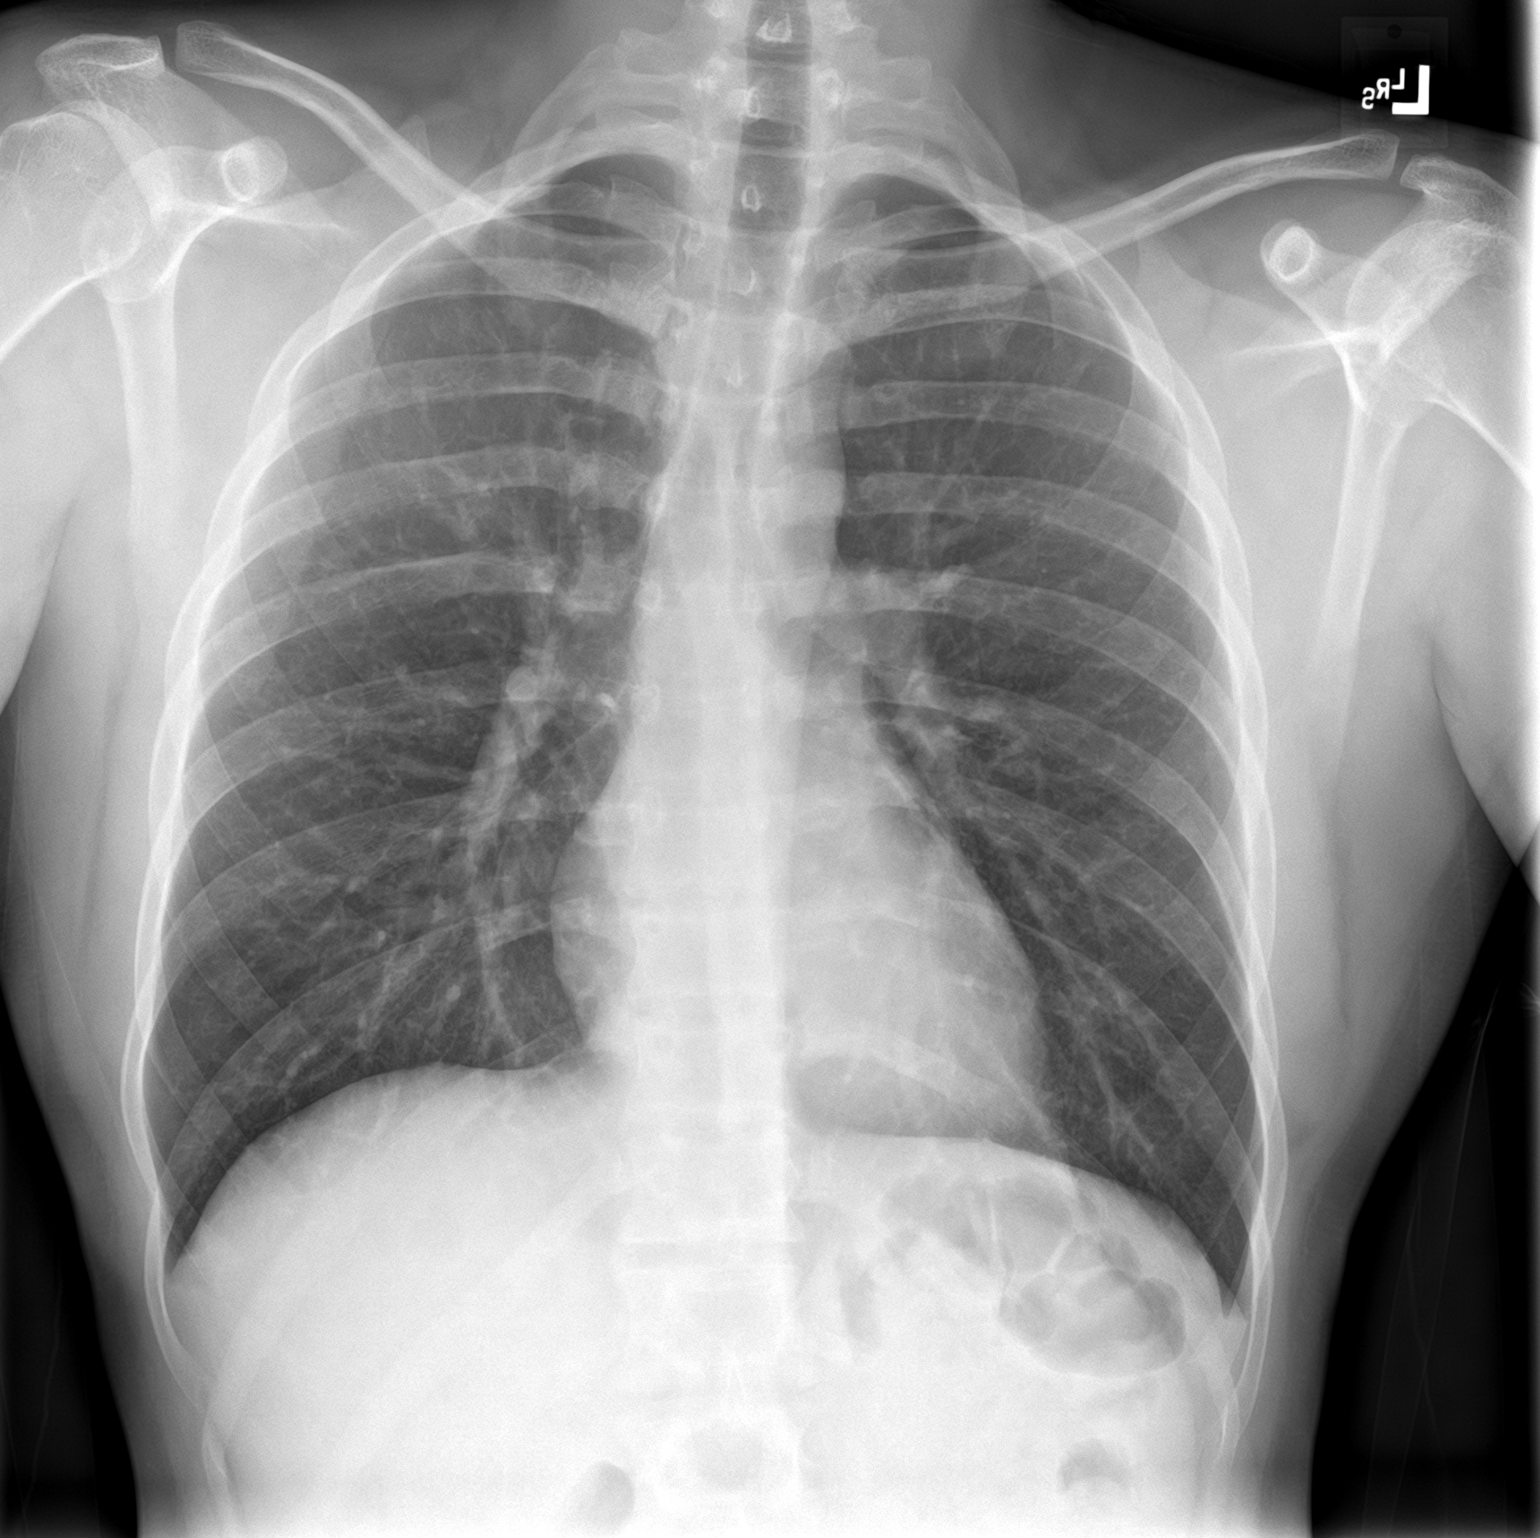

[chest lat]
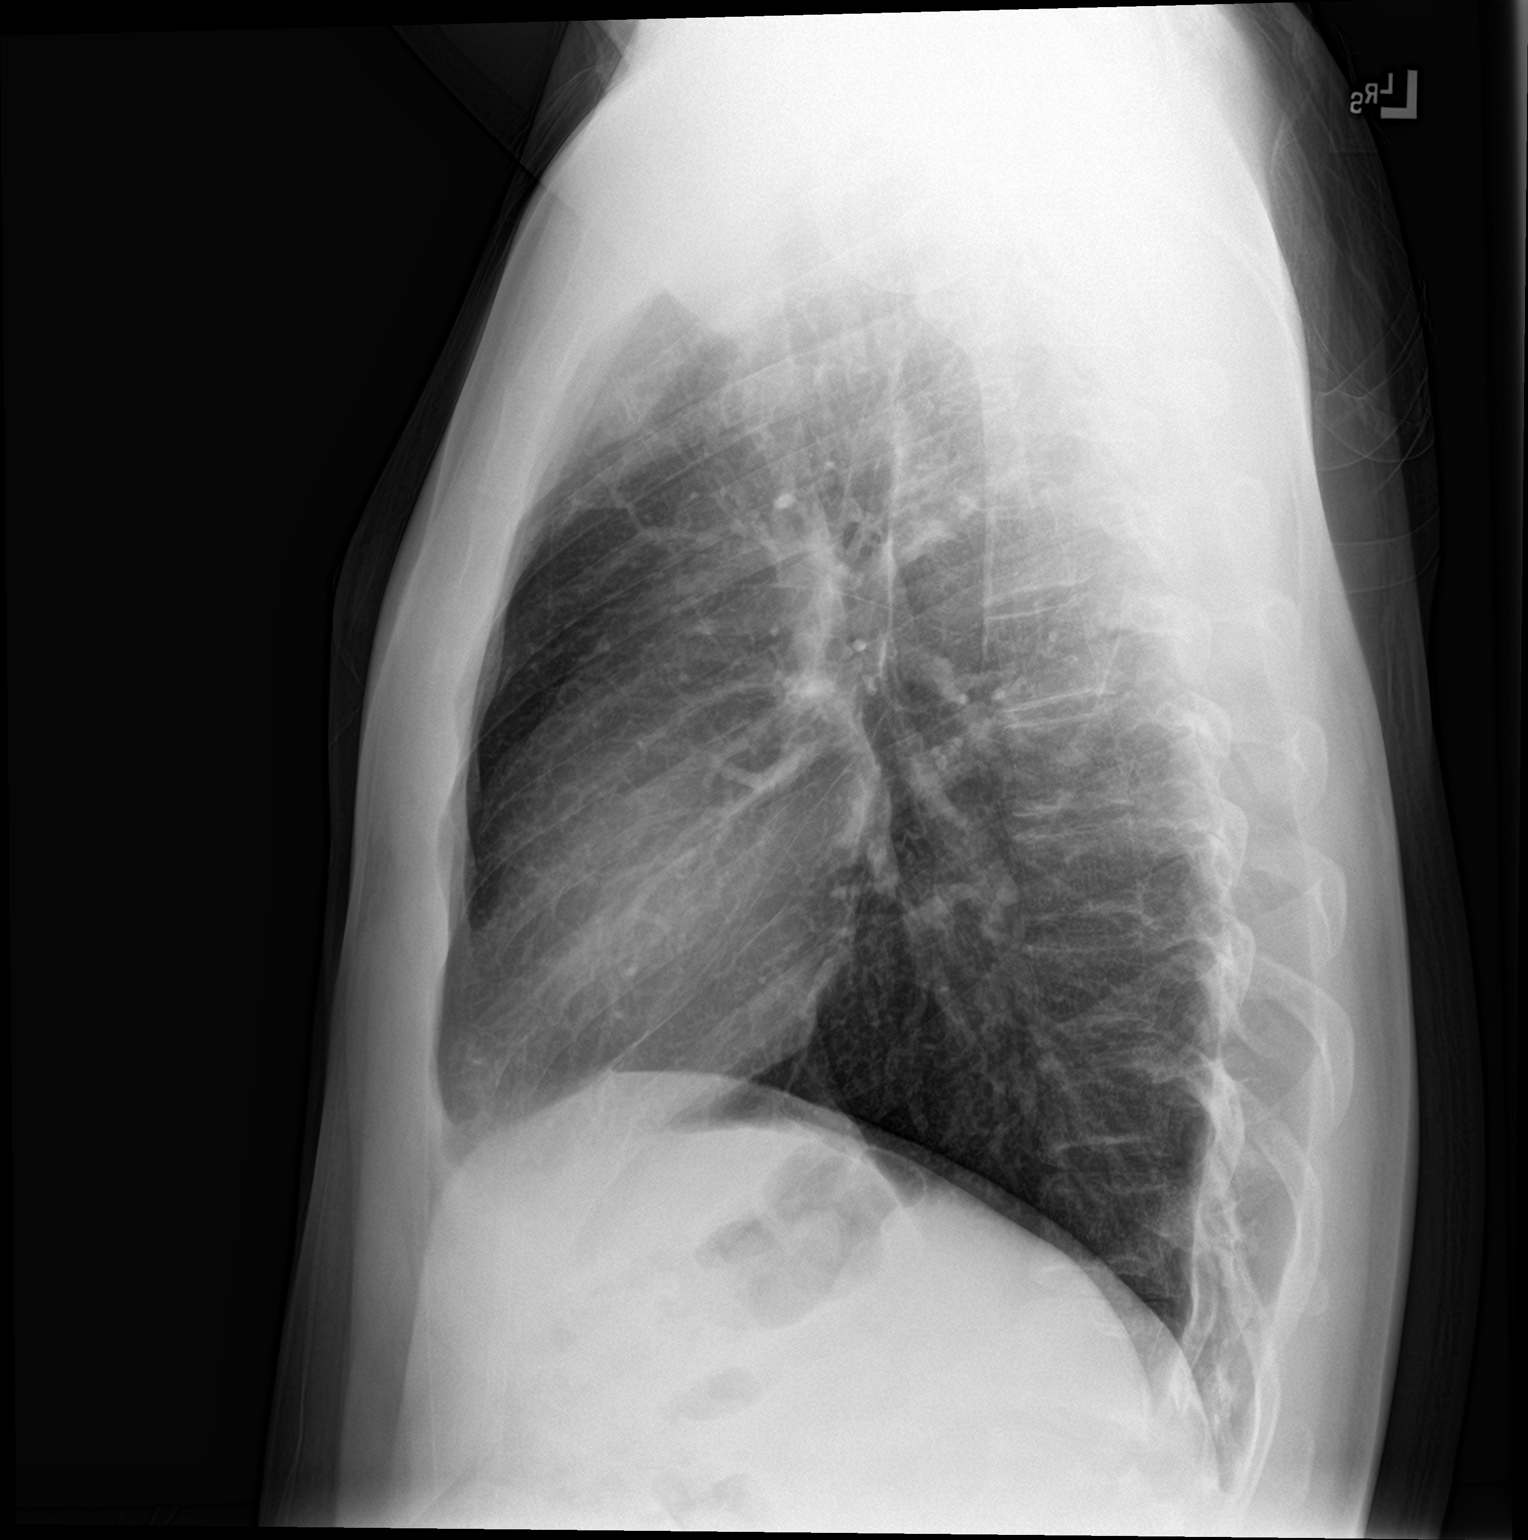

[2 of 2 positions shown; findings below may reference images not displayed]

FINDINGS: The heart size and mediastinal contours are within normal limits.
Both lungs are clear. No pleural effusion or pneumothorax. The
visualized skeletal structures are unremarkable.
IMPRESSION: No acute process in the chest.

## 2021-05-25 IMAGING — US ULTRASOUND ABDOMEN LIMITED
1 series · 14 of 25 positions shown · non-contrast
Comparison: None recent

CLINICAL DATA: Elevated ALT

EXAM:
ULTRASOUND ABDOMEN LIMITED RIGHT UPPER QUADRANT

[Series 1: ultrasound abdomen limited · 0.19mm/px · 14 of 46 slices shown]
[im 1/46]
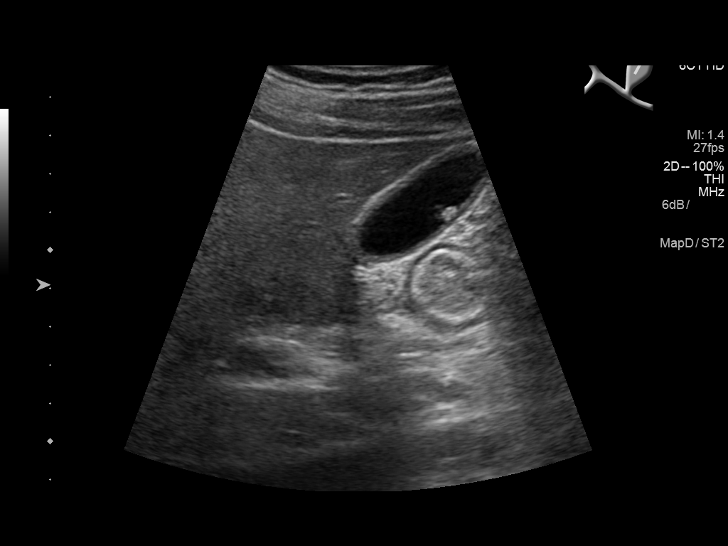
[im 4/46]
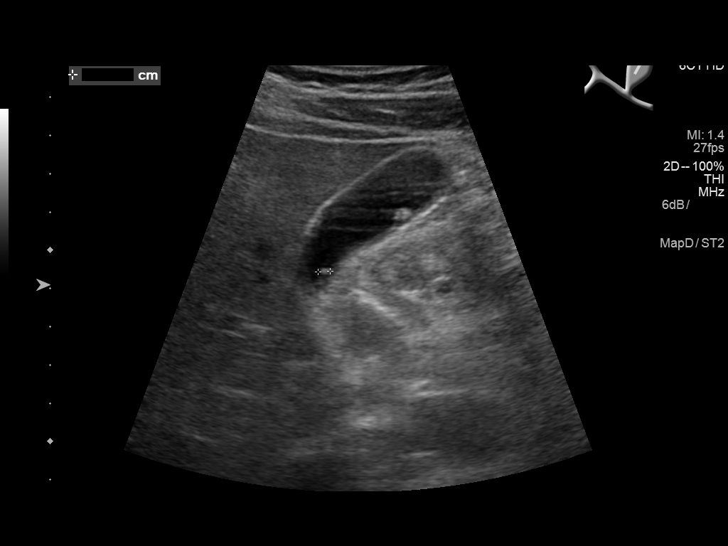
[im 8/46]
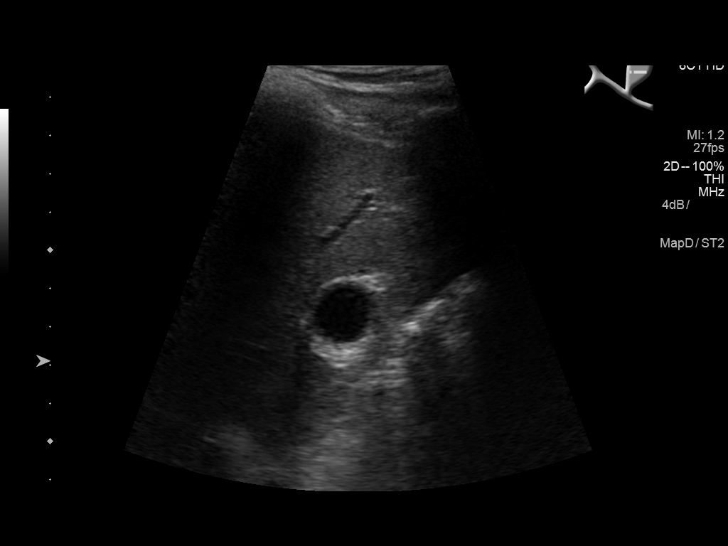
[im 12/46]
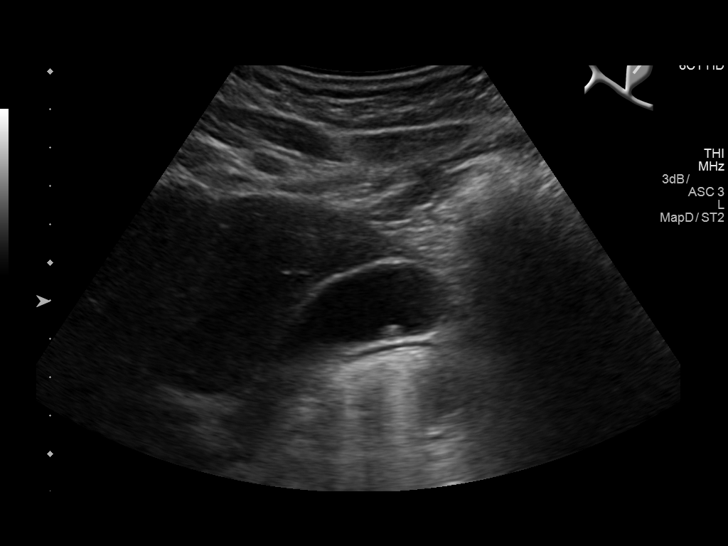
[im 16/46]
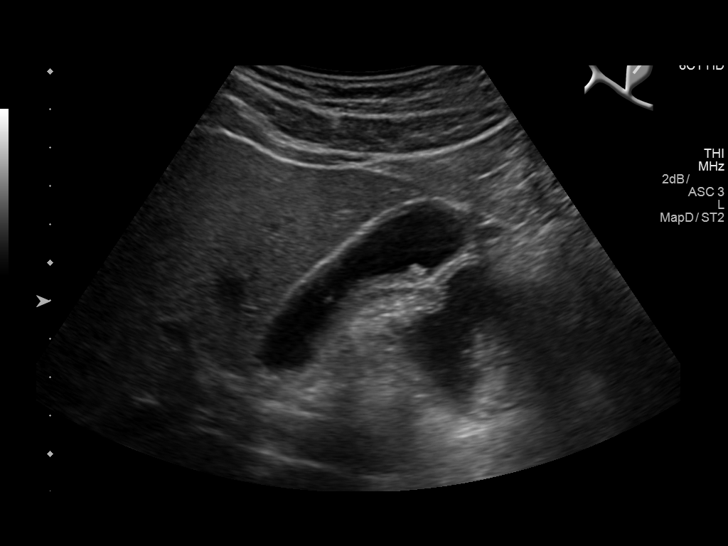
[im 17/46]
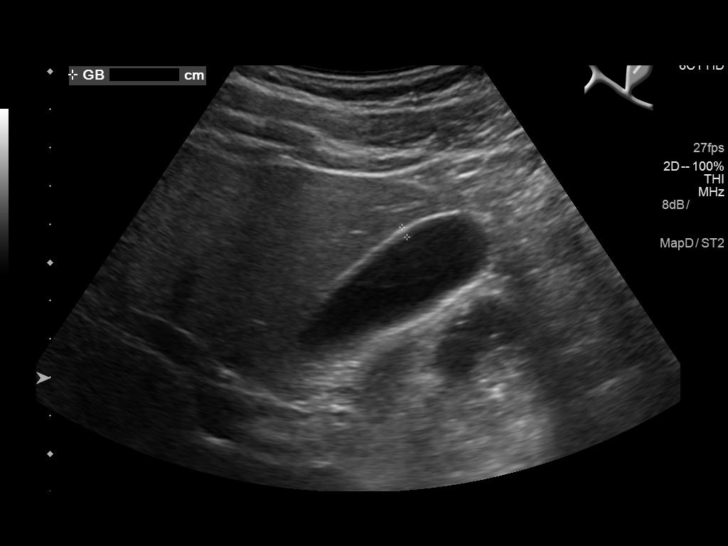
[im 21/46]
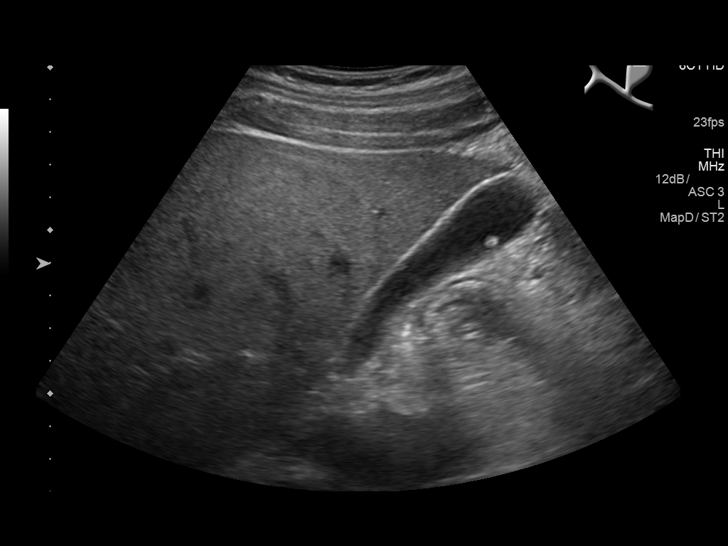
[im 25/46]
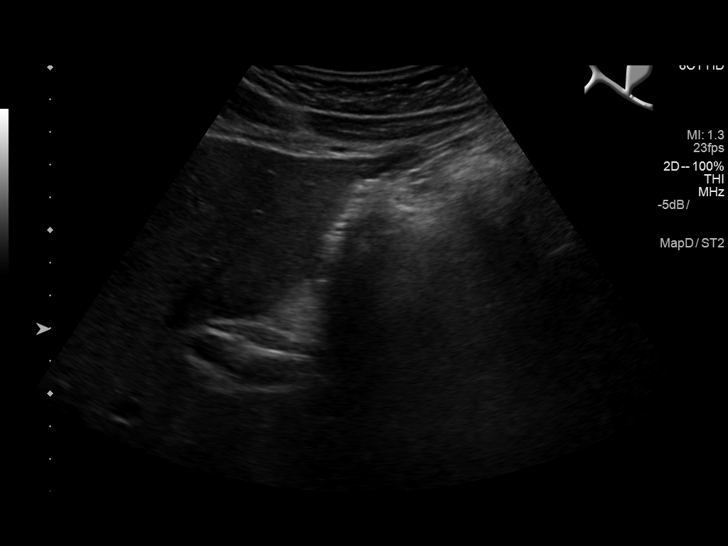
[im 29/46]
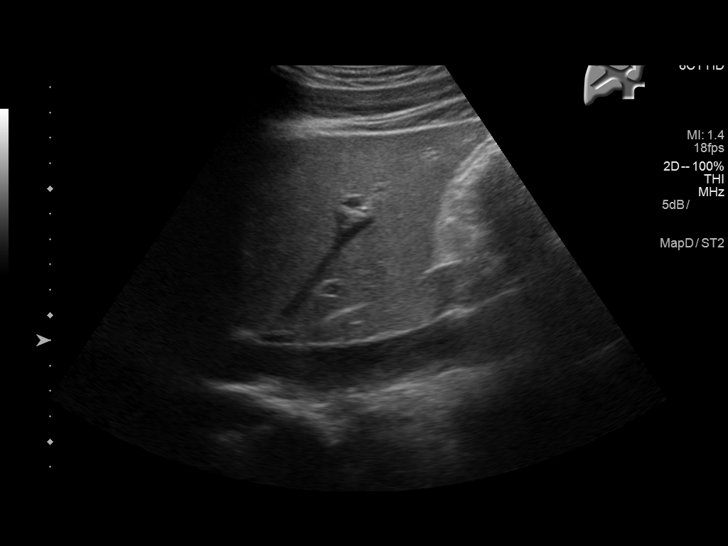
[im 31/46]
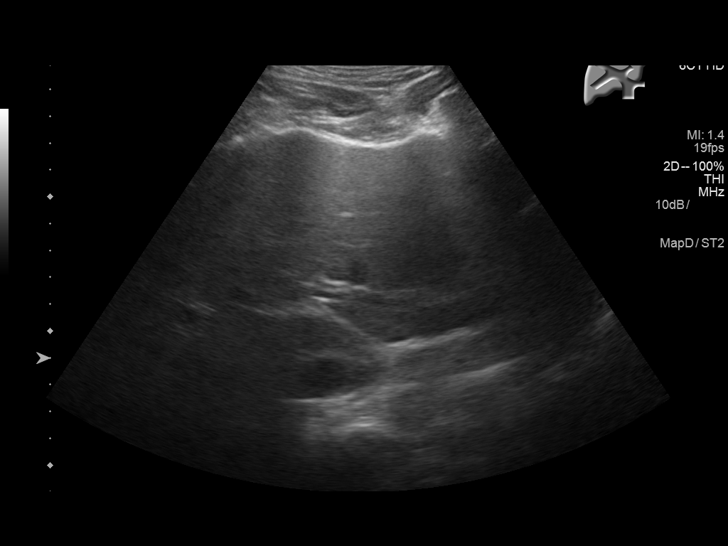
[im 34/46]
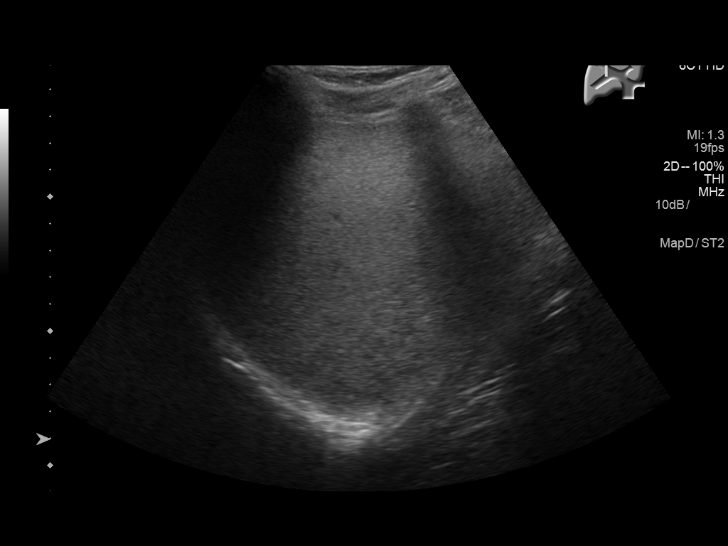
[im 38/46]
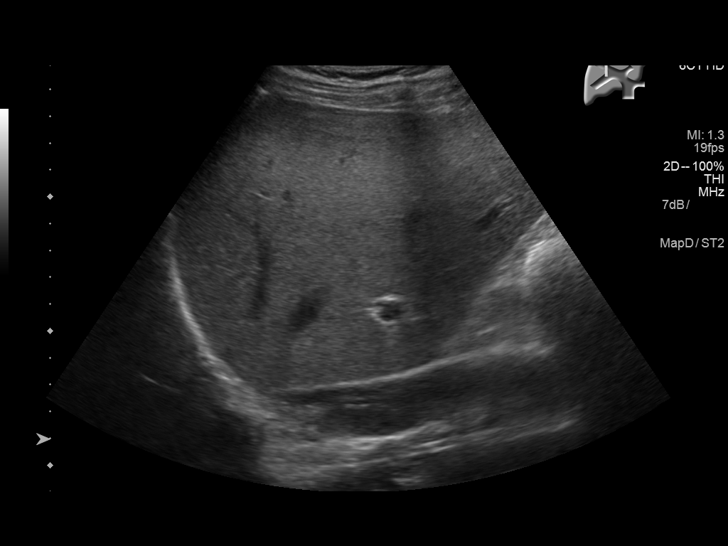
[im 42/46]
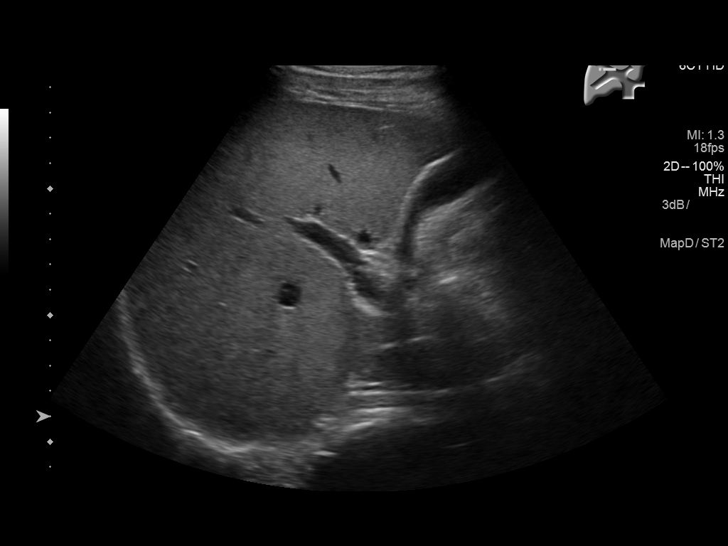
[im 46/46]
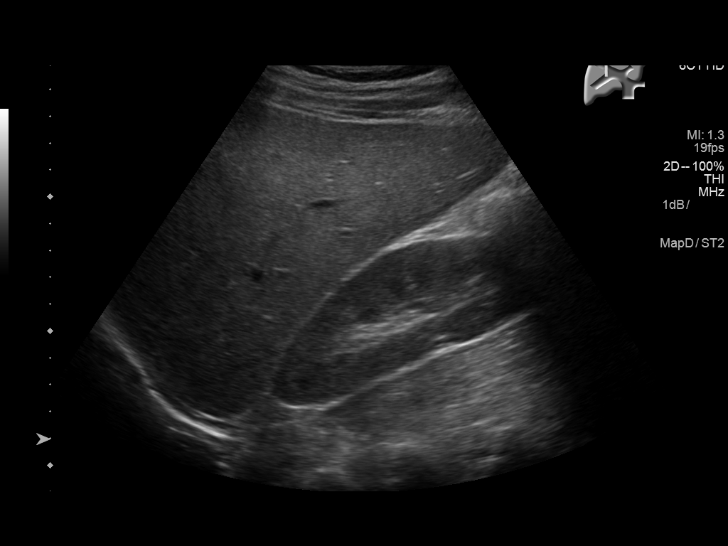

[14 of 25 positions shown; findings below may reference images not displayed]

FINDINGS: Gallbladder:

No gallstones or wall thickening visualized. No sonographic Murphy
sign noted by sonographer. There is a 4.6 mm gallbladder polyp.
Additional smaller gallbladder polyps are noted. Per consensus
statement, gallbladder polyps less than 6 mm are usually benign not
requiring follow-up

Common bile duct:

Diameter: 0.5 cm

Liver:

No focal lesion identified. Within normal limits in parenchymal
echogenicity. Portal vein is patent on color Doppler imaging with
normal direction of blood flow towards the liver.
IMPRESSION: No acute sonographic abnormality detected.

## 2022-04-28 ENCOUNTER — Emergency Department
Admission: EM | Admit: 2022-04-28 | Discharge: 2022-04-28 | Payer: Medicaid Other | Attending: Student in an Organized Health Care Education/Training Program | Admitting: Student in an Organized Health Care Education/Training Program

## 2022-04-28 DIAGNOSIS — R440 Auditory hallucinations: Secondary | ICD-10-CM | POA: Diagnosis present

## 2022-04-28 DIAGNOSIS — Z20822 Contact with and (suspected) exposure to covid-19: Secondary | ICD-10-CM | POA: Diagnosis not present

## 2022-04-28 DIAGNOSIS — F17210 Nicotine dependence, cigarettes, uncomplicated: Secondary | ICD-10-CM | POA: Diagnosis not present

## 2022-04-28 DIAGNOSIS — J45909 Unspecified asthma, uncomplicated: Secondary | ICD-10-CM | POA: Diagnosis not present

## 2022-04-28 LAB — COMPREHENSIVE METABOLIC PANEL
ALT: 39 U/L (ref 0–44)
AST: 28 U/L (ref 15–41)
Albumin: 4.6 g/dL (ref 3.5–5.0)
Alkaline Phosphatase: 61 U/L (ref 38–126)
Anion gap: 12 (ref 5–15)
BUN: 17 mg/dL (ref 6–20)
CO2: 24 mmol/L (ref 22–32)
Calcium: 9.2 mg/dL (ref 8.9–10.3)
Chloride: 99 mmol/L (ref 98–111)
Creatinine, Ser: 0.98 mg/dL (ref 0.61–1.24)
GFR, Estimated: >60 mL/min (ref >60)
Glucose, Bld: 100 mg/dL — ABNORMAL HIGH (ref 70–99)
Potassium: 3.7 mmol/L (ref 3.5–5.1)
Sodium: 135 mmol/L (ref 135–145)
Total Bilirubin: 0.7 mg/dL (ref 0.3–1.2)
Total Protein: 7.6 g/dL (ref 6.5–8.1)

## 2022-04-28 LAB — URINE DRUG SCREEN, QUALITATIVE (ARMC ONLY)
Amphetamines, Ur Screen: POSITIVE — AB
Barbiturates, Ur Screen: NOT DETECTED
Benzodiazepine, Ur Scrn: NOT DETECTED
Cannabinoid 50 Ng, Ur ~~LOC~~: NOT DETECTED
Cocaine Metabolite,Ur ~~LOC~~: NOT DETECTED
MDMA (Ecstasy)Ur Screen: NOT DETECTED
Methadone Scn, Ur: NOT DETECTED
Opiate, Ur Screen: NOT DETECTED
Phencyclidine (PCP) Ur S: NOT DETECTED
Tricyclic, Ur Screen: NOT DETECTED

## 2022-04-28 LAB — SALICYLATE LEVEL: Salicylate Lvl: <7 mg/dL — ABNORMAL LOW (ref 7.0–30.0)

## 2022-04-28 LAB — RESP PANEL BY RT-PCR (RSV, FLU A&B, COVID)  RVPGX2
Influenza A by PCR: NEGATIVE
Influenza B by PCR: NEGATIVE
Resp Syncytial Virus by PCR: NEGATIVE
SARS Coronavirus 2 by RT PCR: NEGATIVE

## 2022-04-28 LAB — CBC
HCT: 46.9 % (ref 39.0–52.0)
Hemoglobin: 16 g/dL (ref 13.0–17.0)
MCH: 32.1 pg (ref 26.0–34.0)
MCHC: 34.1 g/dL (ref 30.0–36.0)
MCV: 94.2 fL (ref 80.0–100.0)
Platelets: 264 10*3/uL (ref 150–400)
RBC: 4.98 MIL/uL (ref 4.22–5.81)
RDW: 12.3 % (ref 11.5–15.5)
WBC: 6.3 10*3/uL (ref 4.0–10.5)
nRBC: 0 % (ref 0.0–0.2)

## 2022-04-28 LAB — ETHANOL: Alcohol, Ethyl (B): <10 mg/dL (ref <10)

## 2022-04-28 LAB — ACETAMINOPHEN LEVEL: Acetaminophen (Tylenol), Serum: <10 ug/mL — ABNORMAL LOW (ref 10–30)

## 2022-04-28 NOTE — ED Notes (Addendum)
[name] [phone]  Contacted and states that she is the "guardian while patient is in the home" and works for the group home. "Pt is warded to the state" per Lovely.   Vanita Ingles with [place] DSS, guardianship with Arlyn Dunning  RN contacted,  Revonda Standard Other [phone] Legal guardian---no answer.

## 2022-04-28 NOTE — ED Notes (Signed)
VOLUNTARY with pending TTS/PSYCH consult

## 2022-04-28 NOTE — ED Notes (Signed)
Belongings: Black mini Special educational needs teacher Black belt Black shoes Jeans Black underwear White socks Black jacket

## 2022-04-28 NOTE — ED Triage Notes (Signed)
Pt states he is hearing voices, they are aggressive and saying his name. Pt states it is a male voice. Pt states this has happened once before, he was put on meds, but no longer takes.   Denies SI. Pt states that he has the though to hurt people, but he does not want to .

## 2022-04-28 NOTE — ED Provider Notes (Signed)
[facility] Provider Note    Event Date/Time   First MD Initiated Contact with Patient 04/28/22 1332     (approximate)   History   Hallucinations   HPI  Joel Arroyo is a 27 y.o. adult 3 of bipolar disorder, PTSD presents to the ER for reportedly hearing voices since noon today.  States this has happened long time ago he was previously on [place] which controlled him well.  He is here voluntarily denies any SI.  He denies any HI on my examination.  Nursing note documents the patient was having thoughts of harming people but patient states that he has in the past but does not have any currently.  He denies any pain no nausea or vomiting.     Physical Exam   Triage Vital Signs: ED Triage Vitals  Enc Vitals Group     BP 04/28/22 1320 125/88     Pulse Rate 04/28/22 1320 80     Resp 04/28/22 1320 16     Temp 04/28/22 1320 98.3 F (36.8 C)     Temp Source 04/28/22 1320 Oral     SpO2 04/28/22 1320 96 %     Weight 04/28/22 1319 225 lb (102.1 kg)     Height 04/28/22 1319 6' (1.829 m)     Head Circumference --      Peak Flow --      Pain Score 04/28/22 1319 0     Pain Loc --      Pain Edu? --      Excl. in [place]? --     Most recent vital signs: Vitals:   04/28/22 1320  BP: 125/88  Pulse: 80  Resp: 16  Temp: 98.3 F (36.8 C)  SpO2: 96%     Constitutional: Alert  Eyes: Conjunctivae are normal.  Head: Atraumatic. Nose: No congestion/rhinnorhea. Mouth/Throat: Mucous membranes are moist.   Neck: Painless ROM.  Cardiovascular:   Good peripheral circulation. Respiratory: Normal respiratory effort.  No retractions.  Gastrointestinal: Soft and nontender.  Musculoskeletal:  no deformity Neurologic:  MAE spontaneously. No gross focal neurologic deficits are appreciated.  Skin:  Skin is warm, dry and intact. No rash noted. Psychiatric: calm and cooperative    ED Results / Procedures / Treatments   Labs (all labs ordered are listed, but only  abnormal results are displayed) Labs Reviewed  RESP PANEL BY RT-PCR (RSV, FLU A&B, COVID)  [id number]  CBC  COMPREHENSIVE METABOLIC PANEL  ETHANOL  SALICYLATE LEVEL  ACETAMINOPHEN LEVEL  URINE DRUG SCREEN, QUALITATIVE ([place])     EKG     RADIOLOGY    PROCEDURES:  Critical Care performed:   Procedures   MEDICATIONS ORDERED IN ED: Medications - No data to display   IMPRESSION / MDM / [place] / ED COURSE  I reviewed the triage vital signs and the nursing notes.                              Differential diagnosis includes, but is not limited to, Psychosis, delirium, medication effect, noncompliance, polysubstance abuse, Si, Hi, depression  Pt here for evaluation of hallucinations.  Patient has psych history of ptsd, hallucinations.  Laboratory testing was ordered to evaluation for underlying electrolyte derangement or signs of underlying organic pathology to explain today's presentation.  Based on history and physical and laboratory evaluation, it appears that the patient's presentation is 2/2 underlying psychiatric disorder and will  require further evaluation and management by psychiatry.  He currently does not meet criteria for IVC.  Will be Voluntary.  Disposition pending psychiatric evaluation.  The patient has been placed in psychiatric observation due to the need to provide a safe environment for the patient while obtaining psychiatric consultation and evaluation, as well as ongoing medical and medication management to treat the patient's condition.  The patient has not been placed under full IVC at this time.        FINAL CLINICAL IMPRESSION(S) / ED DIAGNOSES   Final diagnoses:  Auditory hallucinations     Rx / DC Orders   ED Discharge Orders     None        Note:  This document was prepared using Dragon voice recognition software and may include unintentional dictation errors.    Merlyn Lot, MD 04/28/22 [phone]

## 2022-04-28 NOTE — Consult Note (Cosign Needed Addendum)
[facility] Consult   Reason for Consult:  heard "voices" calling his name earlier today Referring Physician:  Quentin Cornwall Patient Identification: Joel Arroyo MRN:  [id number] Principal Diagnosis: Verbal auditory hallucinations Diagnosis:  Principal Problem:   Verbal auditory hallucinations   Total Time spent with patient: 30 minutes  Subjective: "I heard a voice calling my name on and off earlier today."  Joel Arroyo is a 27 y.o. adult patient admitted with concern for auditory hallucinations.  HPI:  Patient presented to the ED voluntarily for "hearing voices" earlier today. He denies that he hears them now, but said he heard a voice angrily calling his name on and off for a 2 hours today. He is quick to point out that he was listening to RAP music at the time and he thinks that had something to do with it. He states that he picks the skin on his thumb near the nail to make the voices stop. There is some excoriation in the area. Patient denies current auditory hallucinations. Denies that he has ever had visual hallucinations. Denies suicidal or homicidal thoughts or intent.   On evaluation, patient is pleasant and cooperative.   Alert and oriented x 4. He is speaking in complete sentences with good attention. He states that he does take medications but does not know what they are. He reports being in his group home for 2 years and he likes it there. Reports getting along with peers and there have not been any incidences of disruption on his part or that of peers. Patient reports good sleep and appetite. Denies alcohol or illicit drug use. UDS positive for amphetamines, but he has been prescribed Adderall in the past and may be taking that now. He reports he has contact with a therapist online weekly.  Patient states that he wants to go home. There is no current indication that patient poses an imminent threat to himself or others. We need to get collateral from group home  and a list of his medications to see if we can make some adjustments.   Collateral from group home, [name], [phone]: She states that he has been doing well until today. No behavior issues but he has had some in the past and she was concerned about his change of hearing voices today. She reports that he does take medications but she is not where she has access to the list. She will be returning to the group home shortly and will fax the list to [place] for the pharmacy to verify. She says his psychiatric provider is Thad Ranger, NP. Advised Ms. [name] that patient does not meet criteria for inpatient psychiatric hospitalization.  As of 1700, There is no medication reconciliation. No answer at Ms. [name] ' number.  1740: Ms. [name] called and faxed the list here to the pharmacy. She told Probation officer some of the psychiatric medications he is on include Zyprexa, Luvox, Trileptal, Abilify, Lamictal, Buspar, prazosin. She reports they are prescribed by 2 different providers and one of them is not specifically a psych provider. She doesn't know if the other one is.  Discussed with Ms. [name] that it is going to be very important that patient see one provider for all of his psych meds. It appears that he is on a lot of different meds, and while it may be appropriate, he must see a psych provider for a review of his medications. Ms. [name] states that she is going to take him to [facility] tomorrow to establish  care. Ms. Stann Mainland is comfortable with the plan top pick him up this evening. She reiterates that today was the first time lately that he had any symptoms.   Past Psychiatric History: MDD; suicdal ideations; several prior hospitalizations  Risk to Self:   Risk to Others:   Prior Inpatient Therapy:   Prior Outpatient Therapy:    Past Medical History:  Past Medical History:  Diagnosis Date   Anxiety    Asthma    Bipolar 1 disorder ([place])    Depression    PTSD (post-traumatic stress disorder)     Seizures ([place])     Past Surgical History:  Procedure Laterality Date   HERNIA REPAIR     Family History:  Family History  Problem Relation Age of Onset   Kidney cancer Neg Hx    Kidney disease Neg Hx    Prostate cancer Neg Hx    Family Psychiatric  History:  Social History:  Social History   Substance and Sexual Activity  Alcohol Use No     Social History   Substance and Sexual Activity  Drug Use No    Social History   Socioeconomic History   Marital status: Single    Spouse name: Not on file   Number of children: Not on file   Years of education: Not on file   Highest education level: Not on file  Occupational History   Not on file  Tobacco Use   Smoking status: Every Day    Packs/day: 0.50    Types: Cigarettes   Smokeless tobacco: Never  Substance and Sexual Activity   Alcohol use: No   Drug use: No   Sexual activity: Not on file  Other Topics Concern   Not on file  Social History Narrative   Not on file   Social Determinants of Health   Financial Resource Strain: Not on file  Food Insecurity: Not on file  Transportation Needs: Not on file  Physical Activity: Not on file  Stress: Not on file  Social Connections: Not on file   Additional Social History:    Allergies:  No Known Allergies  Labs:  Results for orders placed or performed during the hospital encounter of 04/28/22 (from the past 48 hour(s))  Urine Drug Screen, Qualitative     Status: Abnormal   Collection Time: 04/28/22 12:42 PM  Result Value Ref Range   Tricyclic, Ur Screen NONE DETECTED NONE DETECTED   Amphetamines, Ur Screen POSITIVE (A) NONE DETECTED   MDMA (Ecstasy)Ur Screen NONE DETECTED NONE DETECTED   Cocaine Metabolite,Ur [place] NONE DETECTED NONE DETECTED   Opiate, Ur Screen NONE DETECTED NONE DETECTED   Phencyclidine (PCP) Ur S NONE DETECTED NONE DETECTED   Cannabinoid 50 Ng, Ur [place] NONE DETECTED NONE DETECTED   Barbiturates, Ur Screen NONE DETECTED NONE DETECTED    Benzodiazepine, Ur Scrn NONE DETECTED NONE DETECTED   Methadone Scn, Ur NONE DETECTED NONE DETECTED    Comment: (NOTE) Tricyclics + metabolites, urine    Cutoff 1000 ng/mL Amphetamines + metabolites, urine  Cutoff 1000 ng/mL MDMA (Ecstasy), urine              Cutoff 500 ng/mL Cocaine Metabolite, urine          Cutoff 300 ng/mL Opiate + metabolites, urine        Cutoff 300 ng/mL Phencyclidine (PCP), urine         Cutoff 25 ng/mL Cannabinoid, urine  Cutoff 50 ng/mL Barbiturates + metabolites, urine  Cutoff 200 ng/mL Benzodiazepine, urine              Cutoff 200 ng/mL Methadone, urine                   Cutoff 300 ng/mL  The urine drug screen provides only a preliminary, unconfirmed analytical test result and should not be used for non-medical purposes. Clinical consideration and professional judgment should be applied to any positive drug screen result due to possible interfering substances. A more specific alternate chemical method must be used in order to obtain a confirmed analytical result. Gas chromatography / mass spectrometry (GC/MS) is the preferred confirm atory method. Performed at [facility], [address] [place], [place] [address]   Comprehensive metabolic panel     Status: Abnormal   Collection Time: 04/28/22  1:21 PM  Result Value Ref Range   Sodium 135 135 - 145 mmol/L   Potassium 3.7 3.5 - 5.1 mmol/L   Chloride 99 98 - 111 mmol/L   CO2 24 22 - 32 mmol/L   Glucose, Bld 100 (H) 70 - 99 mg/dL    Comment: Glucose reference range applies only to samples taken after fasting for at least 8 hours.   BUN 17 6 - 20 mg/dL   Creatinine, Ser 0.98 0.61 - 1.24 mg/dL   Calcium 9.2 8.9 - 10.3 mg/dL   Total Protein 7.6 6.5 - 8.1 g/dL   Albumin 4.6 3.5 - 5.0 g/dL   AST 28 15 - 41 U/L   ALT 39 0 - 44 U/L   Alkaline Phosphatase 61 38 - 126 U/L   Total Bilirubin 0.7 0.3 - 1.2 mg/dL   GFR, Estimated >60 >60 mL/min    Comment: (NOTE) Calculated using  the CKD-EPI Creatinine Equation (2021)    Anion gap 12 5 - 15    Comment: Performed at [facility], [address] [place], [place] [id number]  Salicylate level     Status: Abnormal   Collection Time: 04/28/22  1:21 PM  Result Value Ref Range   Salicylate Lvl [id number] (L) 7.0 - 30.0 mg/dL    Comment: Performed at [facility], [address] [place], [place] [address]  Acetaminophen level     Status: Abnormal   Collection Time: 04/28/22  1:21 PM  Result Value Ref Range   Acetaminophen (Tylenol), Serum <10 (L) 10 - 30 ug/mL    Comment: (NOTE) Therapeutic concentrations vary significantly. A range of 10-30 ug/mL  may be an effective concentration for many patients. However, some  are best treated at concentrations outside of this range. Acetaminophen concentrations >150 ug/mL at 4 hours after ingestion  and >50 ug/mL at 12 hours after ingestion are often associated with  toxic reactions.  Performed at [facility], [address] [place], [place] [address]   cbc     Status: None   Collection Time: 04/28/22  1:21 PM  Result Value Ref Range   WBC 6.3 4.0 - 10.5 K/uL   RBC 4.98 4.22 - 5.81 MIL/uL   Hemoglobin 16.0 13.0 - 17.0 g/dL   HCT 46.9 39.0 - 52.0 %   MCV 94.2 80.0 - 100.0 fL   MCH 32.1 26.0 - 34.0 pg   MCHC 34.1 30.0 - 36.0 g/dL   RDW 12.3 11.5 - 15.5 %   Platelets 264 150 - 400 K/uL   nRBC 0.0 0.0 - 0.2 %    Comment: Performed  at [facility], [address] [place], [place] [address]  Ethanol     Status: None   Collection Time: 04/28/22  1:42 PM  Result Value Ref Range   Alcohol, Ethyl (B) <10 <10 mg/dL    Comment: (NOTE) Lowest detectable limit for serum alcohol is 10 mg/dL.  For medical purposes only. Performed at [facility], [address] [place], [place] [address]   Resp panel by RT-PCR (RSV, Flu A&B, Covid) Anterior Nasal Swab     Status: None   Collection Time: 04/28/22  1:42 PM   Specimen: Anterior  Nasal Swab  Result Value Ref Range   SARS Coronavirus 2 by RT PCR NEGATIVE NEGATIVE    Comment: (NOTE) SARS-CoV-2 target nucleic acids are NOT DETECTED.  The SARS-CoV-2 RNA is generally detectable in upper respiratory specimens during the acute phase of infection. The lowest concentration of SARS-CoV-2 viral copies this assay can detect is 138 copies/mL. A negative result does not preclude SARS-Cov-2 infection and should not be used as the sole basis for treatment or other patient management decisions. A negative result may occur with  improper specimen collection/handling, submission of specimen other than nasopharyngeal swab, presence of viral mutation(s) within the areas targeted by this assay, and inadequate number of viral copies(<138 copies/mL). A negative result must be combined with clinical observations, patient history, and epidemiological information. The expected result is Negative.  Fact Sheet for Patients:  EntrepreneurPulse.com.au  Fact Sheet for Healthcare Providers:  IncredibleEmployment.be  This test is no t yet approved or cleared by the [place] FDA and  has been authorized for detection and/or diagnosis of SARS-CoV-2 by FDA under an Emergency Use Authorization (EUA). This EUA will remain  in effect (meaning this test can be used) for the duration of the COVID-19 declaration under Section 564(b)(1) of the Act, 21 U.S.C.section 360bbb-3(b)(1), unless the authorization is terminated  or revoked sooner.       Influenza A by PCR NEGATIVE NEGATIVE   Influenza B by PCR NEGATIVE NEGATIVE    Comment: (NOTE) The Xpert Xpress SARS-CoV-2/FLU/RSV plus assay is intended as an aid in the diagnosis of influenza from Nasopharyngeal swab specimens and should not be used as a sole basis for treatment. Nasal washings and aspirates are unacceptable for Xpert Xpress SARS-CoV-2/FLU/RSV testing.  Fact Sheet for  Patients: EntrepreneurPulse.com.au  Fact Sheet for Healthcare Providers: IncredibleEmployment.be  This test is not yet approved or cleared by the [place] FDA and has been authorized for detection and/or diagnosis of SARS-CoV-2 by FDA under an Emergency Use Authorization (EUA). This EUA will remain in effect (meaning this test can be used) for the duration of the COVID-19 declaration under Section 564(b)(1) of the Act, 21 U.S.C. section 360bbb-3(b)(1), unless the authorization is terminated or revoked.     Resp Syncytial Virus by PCR NEGATIVE NEGATIVE    Comment: (NOTE) Fact Sheet for Patients: EntrepreneurPulse.com.au  Fact Sheet for Healthcare Providers: IncredibleEmployment.be  This test is not yet approved or cleared by the [place] FDA and has been authorized for detection and/or diagnosis of SARS-CoV-2 by FDA under an Emergency Use Authorization (EUA). This EUA will remain in effect (meaning this test can be used) for the duration of the COVID-19 declaration under Section 564(b)(1) of the Act, 21 U.S.C. section 360bbb-3(b)(1), unless the authorization is terminated or revoked.  Performed at [facility], [address] [place], [place] [address]     No current facility-administered medications for this encounter.   Current  Outpatient Medications  Medication Sig Dispense Refill   albuterol (VENTOLIN HFA) 108 (90 Base) MCG/ACT inhaler Inhale 1 puff into the lungs every 4 (four) hours as needed (SOB). 6.7 g 1   amphetamine-dextroamphetamine (ADDERALL) 20 MG tablet Take 1 tablet (20 mg total) by mouth 2 (two) times daily with a meal. 60 tablet 0   amphetamine-dextroamphetamine (ADDERALL) 5 MG tablet Take 4 tablets (20 mg total) by mouth 2 (two) times daily with breakfast and lunch. 60 tablet 0   fluvoxaMINE (LUVOX) 50 MG tablet Take 3 tablets (150 mg total) by mouth at bedtime. 90  tablet 1   lurasidone (LATUDA) 80 MG TABS tablet Take 1 tablet (80 mg total) by mouth daily with supper. 30 tablet 1   oxcarbazepine (TRILEPTAL) 600 MG tablet Take 1 tablet (600 mg total) by mouth 2 (two) times daily. 60 tablet 1   pantoprazole (PROTONIX) 20 MG tablet Take 1 tablet (20 mg total) by mouth daily. 30 tablet 1   prazosin (MINIPRESS) 1 MG capsule Take 1 capsule (1 mg total) by mouth daily. 30 capsule 1    Musculoskeletal: Strength & Muscle Tone: within normal limits Gait & Station: normal Patient leans: N/A  Psychiatric Specialty Exam:  Presentation  General Appearance: Appropriate for Environment  Eye Contact:Good  Speech:Clear and Coherent  Speech Volume:Normal  Handedness:Right   Mood and Affect  Mood:Euthymic ("I feel fine")  Affect:Appropriate; Congruent   Thought Process  Thought Processes:Coherent  Descriptions of Associations:Intact  Orientation:Full (Time, Place and Person)  Thought Content:WDL  History of Schizophrenia/Schizoaffective disorder:No data recorded Duration of Psychotic Symptoms:No data recorded Hallucinations:Hallucinations: None (denies at this time)  Ideas of Reference:None  Suicidal Thoughts:Suicidal Thoughts: No  Homicidal Thoughts:Homicidal Thoughts: No   Sensorium  Memory:Immediate Good  Judgment:Good  Insight:Fair   Executive Functions  Concentration:Fair  Attention Span:No data recorded [place]   Psychomotor Activity  Psychomotor Activity:Psychomotor Activity: Normal   Assets  Assets:Desire for Improvement; Financial Resources/Insurance; Housing; Social Support; Resilience; Physical Health   Sleep  Sleep:Sleep: Good   Physical Exam: Physical Exam Vitals and nursing note reviewed.  HENT:     Head: Normocephalic.     Nose: No congestion or rhinorrhea.  Eyes:     General:        Right eye: No discharge.        Left eye: No discharge.   Cardiovascular:     Rate and Rhythm: Normal rate.  Pulmonary:     Effort: Pulmonary effort is normal.  Musculoskeletal:        General: Normal range of motion.     Cervical back: Normal range of motion.  Skin:    General: Skin is dry.  Neurological:     Mental Status: She is alert and oriented to person, place, and time.  Psychiatric:        Attention and Perception: Attention normal.        Mood and Affect: Mood normal.        Speech: Speech normal.        Behavior: Behavior normal. Behavior is cooperative.        Thought Content: Thought content is not paranoid or delusional. Thought content does not include homicidal or suicidal ideation.        Cognition and Memory: Cognition normal.        Judgment: Judgment normal.    Review of Systems  HENT: Negative.    Eyes: Negative.   Respiratory: Negative.  Cardiovascular: Negative.   Skin:        "Earlier today "picked" skin on left thumb and it made voices stop"  Psychiatric/Behavioral:  Positive for hallucinations (AH earlier today, none now). Negative for depression, memory loss, substance abuse and suicidal ideas. The patient is not nervous/anxious and does not have insomnia.   All other systems reviewed and are negative.  Blood pressure 125/88, pulse 80, temperature 98.3 F (36.8 C), temperature source Oral, resp. rate 16, height 6' (1.829 m), weight 102.1 kg, SpO2 96 %. Body mass index is 30.52 kg/m.  Treatment Plan Summary: Plan : Ms. Peggye Pitt will take patient to [facility] tomorrow to establish with a psychiatric provider who can review his medications and prescribe as appropriate. Patient is not acutely psychiatrically ill and needs to establish with one psychiatric  provider who can prescribe all of his psych meds.  Ms.Richards and patient are comfortable with this plan.  Reviewed with EDP via secure chat .   Disposition: No evidence of imminent risk to self or others at present.   Patient does not meet criteria for  psychiatric inpatient admission. Supportive therapy provided about ongoing stressors. Discussed crisis plan, support from social network, calling 911, coming to the Emergency Department, and calling Suicide Hotline.  Vanetta Mulders, NP 04/28/2022 2:42 PM

## 2022-04-28 NOTE — Discharge Instructions (Signed)
Follow-up with [facility] in the next 1-2 weeks

## 2022-04-28 NOTE — BH Assessment (Signed)
Comprehensive Clinical Assessment (CCA) Screening, Triage and Referral Note  04/28/2022 AVIR DERUITER [id number]  Joel Arroyo, 27 year old male who presents to [facility] ED voluntarily for treatment. Per triage note, Pt states he is hearing voices, they are aggressive and saying his name. Pt states it is a male voice. Pt states this has happened once before, he was put on meds, but no longer takes. Denies SI. Pt states that he has the thought to hurt people, but he does not want to.   During TTS assessment pt presents alert and oriented x 4, restless but cooperative, and mood-congruent with affect. The pt does not appear to be responding to internal or external stimuli. Neither is the pt presenting with any delusional thinking. Pt verified the information provided to triage RN.   Pt identifies his main complaint to be that he was having auditory hallucinations after listening to "hardcore Rap music" earlier today. Patient states the voices were "aggressively calling his full name." Patient reports that picking the skin on his thumb would stop the voices. Patient has a red mark near the side of his thumb. Patient reports he no longer hears the voices, and he is ready to go home. Patient reports living in a group home for 2 years with no present issues or concerns. Overall, he enjoys living there and has a good relationship with both staff and peers. Patient reports having good eating and sleep habits. Patient denies using any illicit substances and alcohol. Patient states he is compliant with his medications and takes them as prescribed. Pt denies current SI/HI/AH/VH. Pt contracts for safety.   Per [name], NP:  Collateral from group home, Joel Arroyo, [phone]: She states that he has been doing well until today. No behavior issues but he has had some in the past and she was concerned about his change of hearing voices today. She reports that he does take medications but she is not where she has  access to the list. She will be returning to the group home shortly and will fax the list to [place] for the pharmacy to verify. She says his psychiatric provider is [name], NP. Advised Ms. Joel Arroyo that patient does not meet criteria for inpatient psychiatric hospitalization.    As of 1700, There is no medication reconciliation. No answer at Ms. Joel Arroyo ' number.    Per [name], NP, pt shows no evidence of imminent risk to self or others at present and does not meet criteria for psychiatric inpatient admission.  Chief Complaint:  Chief Complaint  Patient presents with   Hallucinations   Visit Diagnosis: Verbal auditory hallucinations  Patient Reported Information How did you hear about [place]? No data recorded What Is the Reason for Your Visit/Call Today? Auditory hallucinations  How Long Has This Been Causing You Problems? <Week  What Do You Feel Would Help You the Most Today? -- (Assessment only)   Have You Recently Had Any Thoughts About Hurting Yourself? No  Are You Planning to Commit Suicide/Harm Yourself At This time? No   Have you Recently Had Thoughts About [place]? No  Are You Planning to Harm Someone at This Time? No  Explanation: No data recorded  Have You Used Any Alcohol or Drugs in the Past 24 Hours? No  How Long Ago Did You Use Drugs or Alcohol? No data recorded What Did You Use and How Much? No data recorded  Do You Currently Have a Therapist/Psychiatrist? Yes  Name of Therapist/Psychiatrist: No data recorded  Have You Been Recently Discharged From Any Office Practice or Programs? No  Explanation of Discharge From Practice/Program: No data recorded   CCA Screening Triage Referral Assessment Type of Contact: Face-to-Face  Telemedicine Service Delivery:   Is this Initial or Reassessment?   Date Telepsych consult ordered in [facility]:    Time Telepsych consult ordered in [facility]:    Location of Assessment: [facility] ED  Provider Location: [facility] ED     Collateral Involvement: None provided   Does Patient Have a [place]? No data recorded Name and Contact of Legal Guardian: No data recorded If Minor and Not Living with Parent(s), Who has Custody? No data recorded Is CPS involved or ever been involved? No data recorded Is APS involved or ever been involved? No data recorded  Patient Determined To Be At Risk for Harm To Self or Others Based on Review of Patient Reported Information or Presenting Complaint? No data recorded Method: No data recorded Availability of Means: No data recorded Intent: No data recorded Notification Required: No data recorded Additional Information for Danger to Others Potential: No data recorded Additional Comments for Danger to Others Potential: No data recorded Are There Guns or Other Weapons in Your Home? No  Types of Guns/Weapons: No data recorded Are These Weapons Safely Secured?                            No data recorded Who Could Verify You Are Able To Have These Secured: No data recorded Do You Have any Outstanding Charges, Pending Court Dates, Parole/Probation? No data recorded Contacted To Inform of Risk of Harm To Self or Others: No data recorded  Does Patient Present under Involuntary Commitment? No    [place] of Residence: [place]   Patient Currently Receiving the Following Services: Group Home; [employer]; Medication Management   Determination of Need: Emergent (2 hours)   Options For Referral: ED Visit; Group Home; Medication Management   Discharge Disposition:     [name], Counselor, LCAS-A

## 2022-07-09 ENCOUNTER — Emergency Department: Payer: Medicaid Other

## 2022-07-09 ENCOUNTER — Other Ambulatory Visit: Payer: Self-pay

## 2022-07-09 ENCOUNTER — Emergency Department
Admission: EM | Admit: 2022-07-09 | Discharge: 2022-07-09 | Disposition: A | Discharge status: Home / Self Care | Payer: Medicaid Other | Attending: Emergency Medicine | Admitting: Emergency Medicine

## 2022-07-09 DIAGNOSIS — R569 Unspecified convulsions: Secondary | ICD-10-CM | POA: Insufficient documentation

## 2022-07-09 LAB — COMPREHENSIVE METABOLIC PANEL
ALT: 25 U/L (ref 0–44)
AST: 27 U/L (ref 15–41)
Albumin: 4.6 g/dL (ref 3.5–5.0)
Alkaline Phosphatase: 56 U/L (ref 38–126)
Anion gap: 8 (ref 5–15)
BUN: 15 mg/dL (ref 6–20)
CO2: 26 mmol/L (ref 22–32)
Calcium: 8.9 mg/dL (ref 8.9–10.3)
Chloride: 105 mmol/L (ref 98–111)
Creatinine, Ser: 1.02 mg/dL (ref 0.61–1.24)
GFR, Estimated: >60 mL/min (ref >60)
Glucose, Bld: 121 mg/dL — ABNORMAL HIGH (ref 70–99)
Potassium: 3.8 mmol/L (ref 3.5–5.1)
Sodium: 139 mmol/L (ref 135–145)
Total Bilirubin: 0.6 mg/dL (ref 0.3–1.2)
Total Protein: 7.1 g/dL (ref 6.5–8.1)

## 2022-07-09 LAB — CBC WITH DIFFERENTIAL/PLATELET
Abs Immature Granulocytes: 0.01 10*3/uL (ref 0.00–0.07)
Basophils Absolute: 0 10*3/uL (ref 0.0–0.1)
Basophils Relative: 0 %
Eosinophils Absolute: 0.3 10*3/uL (ref 0.0–0.5)
Eosinophils Relative: 5 %
HCT: 45.6 % (ref 39.0–52.0)
Hemoglobin: 15.6 g/dL (ref 13.0–17.0)
Immature Granulocytes: 0 %
Lymphocytes Relative: 33 %
Lymphs Abs: 2.4 10*3/uL (ref 0.7–4.0)
MCH: 32.6 pg (ref 26.0–34.0)
MCHC: 34.2 g/dL (ref 30.0–36.0)
MCV: 95.4 fL (ref 80.0–100.0)
Monocytes Absolute: 0.5 10*3/uL (ref 0.1–1.0)
Monocytes Relative: 7 %
Neutro Abs: 4.1 10*3/uL (ref 1.7–7.7)
Neutrophils Relative %: 55 %
Platelets: 254 10*3/uL (ref 150–400)
RBC: 4.78 MIL/uL (ref 4.22–5.81)
RDW: 12.4 % (ref 11.5–15.5)
WBC: 7.4 10*3/uL (ref 4.0–10.5)
nRBC: 0 % (ref 0.0–0.2)

## 2022-07-09 LAB — LACTIC ACID, PLASMA: Lactic Acid, Venous: 1.8 mmol/L (ref 0.5–1.9)

## 2022-07-09 MED ORDER — DIPHENHYDRAMINE HCL 50 MG/ML IJ SOLN
25.0000 mg | Freq: Once | INTRAMUSCULAR | Status: AC
  Administered 2022-07-09: 25 mg via INTRAVENOUS
  Filled 2022-07-09: qty 1

## 2022-07-09 MED ORDER — METOCLOPRAMIDE HCL 5 MG/ML IJ SOLN
10.0000 mg | Freq: Once | INTRAMUSCULAR | Status: AC
  Administered 2022-07-09: 10 mg via INTRAVENOUS
  Filled 2022-07-09: qty 2

## 2022-07-09 MED ORDER — ACETAMINOPHEN 500 MG PO TABS
1000.0000 mg | ORAL_TABLET | Freq: Once | ORAL | Status: AC
  Administered 2022-07-09: 1000 mg via ORAL
  Filled 2022-07-09: qty 2

## 2022-07-09 NOTE — Discharge Instructions (Addendum)
Call Dr. Malvin Johns for a follow-up visit.  Continue taking your seizure medications and all other medications as prescribed.  Stay well-hydrated by drinking plenty of fluids.  For headache, you may take Tylenol 650 mg and Motrin 600 mg every 6 hours as needed.  Take with food.  Thank you for choosing [place] for your health care today!  Please see your primary doctor this week for a follow up appointment.   Sometimes, in the early stages of certain disease courses it is difficult to detect in the emergency department evaluation -- so, it is important that you continue to monitor your symptoms and call your doctor right away or return to the emergency department if you develop any new or worsening symptoms.  Please go to the following website to schedule new (and existing) patient appointments:   [email or web address]  If you do not have a primary doctor try calling the following clinics to establish care:  If you have insurance:  [facility] [phone] [address] [place] [place] [address]   [facility]  [phone] [address] [place] [place] [address]   If you do not have insurance:  [facility]  [phone] [address], [place] [address]   The following is another list of primary care offices in the area who are accepting new patients at this time.  Please reach out to one of them directly and let them know you would like to schedule an appointment to follow up on an Emergency Department visit, and/or to establish a new primary care provider (PCP).  There are likely other primary care clinics in the are who are accepting new patients, but this is an excellent place to start:  [facility] Lead physician: Dr Shirlee Latch [address] [place], [place] [address] [phone]  [facility] Lead Physician: Dr Alba Cory [address], [place], [place]  [address] [phone]  [facility]  Lead Physician: Dr Olevia Perches [address] [place], [place] [address] [phone]  [facility] Lead Physician: Dr Sofie Hartigan [address] [place], [place], [place] [address] [phone]  [facility] Lead Physician: Dr Bari Edward [address] [place], [place] [address] [phone]   It was my pleasure to care for you today.   Daneil Dan Modesto Charon, MD

## 2022-07-09 NOTE — ED Triage Notes (Signed)
Arrives c/o 3 seizures today.  Group home representative reports 8 seizures this week.  3 back to back last night.  This morning patient was not responding to anything for 10 minutes.  Patient has pseudoseizures.  Patient c/o headache.  aAOx3.  Skin warm and dry. NAD

## 2022-07-09 NOTE — ED Provider Notes (Signed)
[facility] Provider Note    Event Date/Time   First MD Initiated Contact with Patient 07/09/22 [phone]     (approximate)   History   No chief complaint on file.   HPI  Joel Arroyo is a 27 y.o. adult   Past medical history of seizures vs. pseudoseizures who presents to the emergency department with seizure-like activities multiple over the last several days.  Witnessed generalized tonic-clonic activity but no  postictal period.  He does have headache.  He did hit his head 1 time at the beginning of these episodes.  No focal infectious symptoms.  No other complaints.  Fully compliant with medications, no drugs or alcohol.   Independent Historian contributed to assessment above: group home staff here with patient   External Medical Documents Reviewed: Feb 2024 ED note for hallucinations      Physical Exam   Triage Vital Signs: ED Triage Vitals  Enc Vitals Group     BP 07/09/22 1424 124/78     Pulse Rate 07/09/22 1424 67     Resp 07/09/22 1424 20     Temp 07/09/22 1424 98.1 F (36.7 C)     Temp src --      SpO2 07/09/22 1424 96 %     Weight 07/09/22 1440 225 lb 1.4 oz (102.1 kg)     Height 07/09/22 1440 6' (1.829 m)     Head Circumference --      Peak Flow --      Pain Score 07/09/22 1440 0     Pain Loc --      Pain Edu? --      Excl. in GC? --     Most recent vital signs: Vitals:   07/09/22 1700 07/09/22 1824  BP: 131/79 128/76  Pulse: 70 74  Resp: 18 19  Temp:  98.6 F (37 C)  SpO2: 98% 99%    General: Awake, no distress.  CV:  Good peripheral perfusion.  Resp:  Normal effort.  Abd:  No distention.  Other:  Awake alert comfortable normal vital signs does not appear acutely intoxicated nor withdrawal, no tremors, skin appears warm well-perfused, no signs of head trauma.  Neck is supple with full range of motion.   ED Results / Procedures / Treatments   Labs (all labs ordered are listed, but only abnormal results are  displayed) Labs Reviewed  COMPREHENSIVE METABOLIC PANEL - Abnormal; Notable for the following components:      Result Value   Glucose, Bld 121 (*)    All other components within normal limits  LACTIC ACID, PLASMA  CBC WITH DIFFERENTIAL/PLATELET  LAMOTRIGINE LEVEL     I ordered and reviewed the above labs they are notable for blood glucose is 121, electrolytes within normal limits and a normal lactic acid  EKG  ED ECG REPORT I, Pilar Jarvis, the attending physician, personally viewed and interpreted this ECG.   Date: 07/09/2022  EKG Time: 1641  Rate: 67  Rhythm: NSR  Axis: rad  Intervals:none  ST&T Change: no acute ischemic changes    RADIOLOGY I independently reviewed and interpreted CT scan of the head see no obvious bleeding or midline shift   PROCEDURES:  Critical Care performed: No  Procedures   MEDICATIONS ORDERED IN ED: Medications  acetaminophen (TYLENOL) tablet 1,000 mg (1,000 mg Oral Given 07/09/22 1705)  metoCLOPramide (REGLAN) injection 10 mg (10 mg Intravenous Given 07/09/22 1705)  diphenhydrAMINE (BENADRYL) injection 25 mg (25 mg Intravenous Given  07/09/22 1705)   IMPRESSION / MDM / ASSESSMENT AND PLAN / ED COURSE  I reviewed the triage vital signs and the nursing notes.                                Patient's presentation is most consistent with acute presentation with potential threat to life or bodily function.  Differential diagnosis includes, but is not limited to, seizure, electrolyte derangement, hypoglycemia, psychogenic nonepileptic seizures, medication nonadherence   The patient is on the cardiac monitor to evaluate for evidence of arrhythmia and/or significant heart rate changes.  MDM: Patient with a history of seizures versus pseudoseizures, seen by neurology Dr. Malvin Johns and on Lamictal, fully compliant, with seizure-like activity today.  Unclear whether true epileptic seizures versus pseudoseizures, but given no obvious postictal period  and a normal lactic acid today, not very convincing for epileptic seizure.  He otherwise feels well now and his electrolytes are normal, CT head is negative (obtained given headache and head strike after his first seizure-like activity with increasing seizure-like activities since then.)   I considered hospitalization for admission or observation however given no more seizure-like activities and negative workup as above, Lamictal level is pending and he will review with his neurologist, established neurologist follow-up I think outpatient follow-up and monitoring most appropriate this time.  Return precautions given.        FINAL CLINICAL IMPRESSION(S) / ED DIAGNOSES   Final diagnoses:  Seizure-like activity     Rx / DC Orders   ED Discharge Orders     None        Note:  This document was prepared using Dragon voice recognition software and may include unintentional dictation errors.    Pilar Jarvis, MD 07/09/22 [phone]

## 2022-07-11 LAB — LAMOTRIGINE LEVEL: Lamotrigine Lvl: 3.4 ug/mL (ref 2.0–20.0)

## 2022-07-20 DIAGNOSIS — F4010 Social phobia, unspecified: Secondary | ICD-10-CM | POA: Insufficient documentation

## 2022-07-20 DIAGNOSIS — F4310 Post-traumatic stress disorder, unspecified: Secondary | ICD-10-CM | POA: Insufficient documentation

## 2022-07-20 DIAGNOSIS — F3181 Bipolar II disorder: Secondary | ICD-10-CM | POA: Insufficient documentation

## 2022-07-20 DIAGNOSIS — F71 Moderate intellectual disabilities: Secondary | ICD-10-CM | POA: Insufficient documentation

## 2023-05-18 ENCOUNTER — Encounter: Payer: Self-pay | Admitting: Orthopedic Surgery

## 2023-05-18 ENCOUNTER — Other Ambulatory Visit (INDEPENDENT_AMBULATORY_CARE_PROVIDER_SITE_OTHER): Payer: MEDICAID

## 2023-05-18 ENCOUNTER — Ambulatory Visit: Payer: MEDICAID | Admitting: Orthopedic Surgery

## 2023-05-18 VITALS — BP 146/103 | HR 87 | Ht 72.0 in | Wt 220.0 lb

## 2023-05-18 DIAGNOSIS — M25561 Pain in right knee: Secondary | ICD-10-CM

## 2023-05-18 DIAGNOSIS — G8929 Other chronic pain: Secondary | ICD-10-CM

## 2023-05-18 DIAGNOSIS — M2241 Chondromalacia patellae, right knee: Secondary | ICD-10-CM

## 2023-05-18 MED ORDER — MELOXICAM 7.5 MG PO TABS: 7.5000 mg | ORAL_TABLET | Freq: Every day | ORAL | 5 refills | Status: AC

## 2023-05-18 NOTE — Patient Instructions (Signed)
 Physical therapy has been ordered for you at [facility]. They should call you to schedule, [phone] is the phone number to call, if you want to call to schedule.

## 2023-05-18 NOTE — Progress Notes (Signed)
  Intake history:  BP (!) 146/103   Pulse 87   Ht 6' (1.829 m)   Wt 220 lb (99.8 kg)   BMI 29.84 kg/m  Body mass index is 29.84 kg/m.    WHAT ARE WE SEEING YOU FOR TODAY?   right knee(s)  How long has this bothered you? (DOI?DOS?WS?)  Couple months   Anticoag.  No  Diabetes No  Heart disease No  Hypertension Yes  SMOKING HX Yes  Kidney disease No  Any ALLERGIES ____________No Known Allergies __________________________________   Treatment:  Have you taken:  Tylenol Yes  Advil No  Had PT No  Had injection No  Other  _______took steroids __________________

## 2023-05-18 NOTE — Progress Notes (Signed)
  Subjective:     Patient ID: Joel Arroyo, adult   DOB: September 18, 1995, 28 y.o.   MRN: [id number]  28 year old male presents as a referral for right knee pain.  He is had knee pain for 1 to 2 months no trauma  Complains of pain when he sitting for a long time  Complains of pain medial and lateral side of the knee including both joint lines.  No effusion no catching no locking no giving way but he does feel some cracking and popping in the knee  Knee Pain  Pertinent negatives include no numbness.     Review of Systems  Constitutional:  Negative for chills and fever.  Musculoskeletal:  Negative for back pain.  Neurological:  Negative for numbness.       Objective:   Physical Exam Vitals and nursing note reviewed.  Constitutional:      Appearance: Normal appearance.  HENT:     Head: Normocephalic and atraumatic.  Eyes:     General: No scleral icterus.       Right eye: No discharge.        Left eye: No discharge.     Extraocular Movements: Extraocular movements intact.     Conjunctiva/sclera: Conjunctivae normal.     Pupils: Pupils are equal, round, and reactive to light.  Cardiovascular:     Rate and Rhythm: Normal rate.     Pulses: Normal pulses.     Comments: NO SWELLING OR VARICOSITIES  Musculoskeletal:     Right knee: Normal.     Left knee: Normal.     Comments: GAIT NORMAL  Hip range of motion is normal bilaterally  Right knee tenderness medial joint line lateral joint line medial lateral patella  No crepitance on patellofemoral palpation or range of motion  Negative patella compression test  Tenderness on the medial and lateral facets  Knee otherwise stable good strength quadriceps muscle tone normal  Skin:    General: Skin is warm and dry.     Capillary Refill: Capillary refill takes less than 2 seconds.     Findings: No bruising, erythema or rash.  Neurological:     General: No focal deficit present.     Mental Status: She is alert and oriented to  person, place, and time.     Comments: NORMAL SENSATION IN BOTH LOWER EXTREMITIES   Psychiatric:        Mood and Affect: Mood normal.        Behavior: Behavior normal.        Thought Content: Thought content normal.        Judgment: Judgment normal.        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    Dispense:  30 tablet    Refill:  5

## 2023-06-16 NOTE — Therapy (Signed)
 OUTPATIENT PHYSICAL THERAPY LOWER EXTREMITY EVALUATION   Patient Name: Joel Arroyo MRN: [id number] DOB:1995-07-05, 28 y.o., adult Today's Date: 06/17/2023  END OF SESSION:  PT End of Session - 06/17/23 1416     Visit Number 1    Number of Visits 8    Date for PT Re-Evaluation 07/15/23    Authorization Type [employer]; medicaid secondary    Authorization Time Period please check auth    PT Start Time 1417    PT Stop Time 1500    PT Time Calculation (min) 43 min    Activity Tolerance Patient tolerated treatment well    Behavior During Therapy [facility] for tasks assessed/performed             Past Medical History:  Diagnosis Date   Anxiety    Asthma    Bipolar 1 disorder (HCC)    Depression    PTSD (post-traumatic stress disorder)    Seizures (HCC)    Past Surgical History:  Procedure Laterality Date   HERNIA REPAIR     Patient Active Problem List   Diagnosis Date Noted   Intellectual developmental disorder, moderate 07/20/2022   PTSD (post-traumatic stress disorder) 07/20/2022   Social anxiety disorder 07/20/2022   Bipolar II disorder (HCC) 07/20/2022   Verbal auditory hallucinations 04/28/2022   Major depressive disorder 01/29/2019   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   Major depression, recurrent (HCC) 09/05/2018   MDD (major depressive disorder), severe (HCC) 08/17/2018   Self-inflicted laceration of wrist (HCC) 10/07/2017   Cannabis abuse 10/07/2017   Bipolar affective disorder, currently depressed, mild (HCC) 10/13/2016   Seizure-like activity (HCC)    Psychosis (HCC)    Seizures (HCC) 09/06/2016   Suicidal ideation 09/15/2015   Homicidal ideation 09/15/2015   ADHD (attention deficit hyperactivity disorder) 09/14/2015    PCP: Franco Nones, FNP  REFERRING PROVIDER: Vickki Hearing, MD  REFERRING DIAG: [phone] (ICD-10-CM) - Acute pain of right knee M22.41 (ICD-10-CM) - Chondromalacia patellae, right  knee  THERAPY DIAG:  Acute pain of right knee - Plan: PT plan of care cert/re-cert  Difficulty in walking, not elsewhere classified - Plan: PT plan of care cert/re-cert  Stiffness of right knee, not elsewhere classified - Plan: PT plan of care cert/re-cert  Rationale for Evaluation and Treatment: Rehabilitation  ONSET DATE: 2 months  SUBJECTIVE:   SUBJECTIVE STATEMENT: Saw Joel Arroyo last month because of the right knee pain; insidious onset; 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  Dr. Romeo Arroyo prescribed Meloxicam and that doesn't help a lot and referred to physical therapy; knee will sometimes "pop" and hurt. Sometimes makes him limp; reports multiple ankle sprains Per chart review has Intellectual development disorder PERTINENT HISTORY: Anxiety Intellectual development disorder Bipolar Depression PTSD PAIN:  Are you having pain? Yes: NPRS scale: 5/10; pop 10/10 Pain location: right knee Pain description: medial and lateral joint line and anterior knee  Aggravating factors: bending Relieving factors: take Tylenol  PRECAUTIONS: None     WEIGHT BEARING RESTRICTIONS: No  FALLS:  Has patient fallen in last 6 months? No  OCCUPATION: works on a farm  PLOF: Independent  PATIENT GOALS: decreased right knee pain  NEXT MD VISIT: PRN  OBJECTIVE:  Note: Objective measures were completed at Evaluation unless otherwise noted.  DIAGNOSTIC FINDINGS:   Narrative & Impression  Imaging of the right knee   No injury   Alignment looks  normal joint spaces maintained patellofemoral joint normal   Normal no right knee      PATIENT SURVEYS:  LEFS 36/80 45%  COGNITION: Overall cognitive status: History of cognitive impairments - at baseline    Intellectual development disorder per chart review   POSTURE: No Significant postural limitations  PALPATION: Tender right anterior knee and medial and lateral  joint line; medial more than lateral.  LOWER EXTREMITY ROM:  Active ROM Right eval Left eval  Hip flexion    Hip extension    Hip abduction    Hip adduction    Hip internal rotation    Hip external rotation    Knee flexion 108* 131  Knee extension -5 0  Ankle dorsiflexion    Ankle plantarflexion    Ankle inversion    Ankle eversion     (Blank rows = not tested)  LOWER EXTREMITY MMT:  MMT Right eval Left eval  Hip flexion 4* 5  Hip extension 3 4-  Hip abduction 3- 4+  Hip adduction    Hip internal rotation    Hip external rotation    Knee flexion 4- 4+  Knee extension 4-* 5  Ankle dorsiflexion 4+ 5  Ankle plantarflexion    Ankle inversion    Ankle eversion     (Blank rows = not tested)  LOWER EXTREMITY SPECIAL TESTS:  Knee special tests: Anterior drawer test: positive , Patellafemoral apprehension test: negative, and Valgus stress test painful mildly lax compared to left  FUNCTIONAL TESTS:  5 times sit to stand: 14.34 sec using hands to assist  GAIT: Distance walked: 50 ft in clinic Assistive device utilized: None Level of assistance: Modified independence Comments: slight antalgic gait noted; decreased stance right lower extremity                                                                                                                                TREATMENT DATE: 06/17/23 physical therapy evaluation and HEP instruction    PATIENT EDUCATION:  Education details: Patient educated on exam findings, POC, scope of PT, HEP, and what to expect next visit. Person educated: Patient Education method: Explanation, Demonstration, and Handouts Education comprehension: verbalized understanding, returned demonstration, verbal cues required, and tactile cues required  HOME EXERCISE PROGRAM: Access Code: [email or web address] URL: [email or web address] Date: 06/17/2023 Prepared by: AP - Rehab  Exercises - Supine Quad Set  - 2 x daily - 7 x weekly - 1 sets -  10 reps - 5 sec hold - Seated Table Hamstring Stretch  - 2 x daily - 7 x weekly - 3 sets - 5 reps - 20 sec hold - knee bends on the step using rails to hold onto  - 2 x daily - 7 x weekly - 1 sets - 10 reps  ASSESSMENT:  CLINICAL IMPRESSION: Patient is a 28 y.o. male who was seen today for physical therapy evaluation and treatment for M25.561 (ICD-10-CM) - Acute pain of  right knee M22.41 (ICD-10-CM) - Chondromalacia patellae, right knee. Patient demonstrates muscle weakness, reduced ROM, and fascial restrictions which are likely contributing to symptoms of pain and are negatively impacting patient ability to perform ADLs and functional mobility tasks. Patient will benefit from skilled physical therapy services to address these deficits to reduce pain and improve level of function with ADLs and functional mobility tasks.   OBJECTIVE IMPAIRMENTS: Abnormal gait, decreased activity tolerance, decreased mobility, difficulty walking, decreased ROM, decreased strength, increased fascial restrictions, impaired perceived functional ability, and pain.   ACTIVITY LIMITATIONS: carrying, lifting, bending, standing, squatting, stairs, and locomotion level  PARTICIPATION LIMITATIONS: meal prep, cleaning, community activity, occupation, and yard work  [facility] POTENTIAL: Good  CLINICAL DECISION MAKING: Evolving/moderate complexity  EVALUATION COMPLEXITY: Moderate   GOALS: Goals reviewed with patient? No  SHORT TERM GOALS: Target date: 07/01/2023 patient will be independent with initial HEP  Baseline: Goal status: INITIAL  2.  Patient will report 30% improvement overall   Baseline:  Goal status: INITIAL  3.  Patient will improve right knee flexion by 12 degrees to improve ability to navigate steps Baseline:  Goal status: INITIAL  LONG TERM GOALS: Target date: 07/15/2023  Patient will be independent in self management strategies to improve quality of life and functional outcomes.  Baseline:   Goal status: INITIAL  2.  Patient will report 50% improvement overall   Baseline:  Goal status: INITIAL  3.  Patient will improve 5 times sit to stand score to 11 sec or less without use of Ue's to demonstrate improved functional mobility and  increased leg strength. Baseline: 14.34 sec with Ue's to assist Goal status: INITIAL  4.   Patient will increase right leg MMT's to 4+ to 5/5 to allow navigation of steps without gait deviation or loss of balance   Baseline:  Goal status: INITIAL  5.  Patient will improve LEFS score by 14 points to demonstrate improved perceived function  Baseline: 36/80 Goal status: INITIAL    PLAN:  PT FREQUENCY: 2x/week  PT DURATION: 4 weeks  PLANNED INTERVENTIONS: 97164- PT Re-evaluation, 97110-Therapeutic exercises, 97530- Therapeutic activity, 97112- Neuromuscular re-education, 97535- Self Care, 16109- Manual therapy, [phone]- Gait training, [phone]- Orthotic Fit/training, [phone]- Canalith repositioning, [id number]- Aquatic Therapy, [phone]- Splinting, Patient/Family education, Balance training, Stair training, Taping, Dry Needling, Joint mobilization, Joint manipulation, Spinal manipulation, Spinal mobilization, Scar mobilization, and DME instructions.   PLAN FOR NEXT SESSION: Review HEP and goals; right knee mobility and strength   3:15 PM, 06/17/23 Joel Arroyo Joel Arroyo MPT [facility] [place] [phone] Ph:[phone]   Managed Medicaid Authorization Request  Visit Dx Codes: M 25.561, R26.2, M25.661  Functional Tool Score: LEFS 36/80  For all possible CPT codes, reference the Planned Interventions line above.     Check all conditions that are expected to impact treatment: {Conditions expected to impact treatment:Cognitive Impairment or Intellectual disability   If treatment provided at initial evaluation, no treatment charged due to lack of authorization.

## 2023-06-17 ENCOUNTER — Other Ambulatory Visit: Payer: Self-pay

## 2023-06-17 ENCOUNTER — Ambulatory Visit (HOSPITAL_COMMUNITY): Payer: MEDICAID | Attending: Orthopedic Surgery

## 2023-06-17 DIAGNOSIS — M2241 Chondromalacia patellae, right knee: Secondary | ICD-10-CM | POA: Diagnosis not present

## 2023-06-17 DIAGNOSIS — M25561 Pain in right knee: Secondary | ICD-10-CM | POA: Diagnosis present

## 2023-06-17 DIAGNOSIS — M25661 Stiffness of right knee, not elsewhere classified: Secondary | ICD-10-CM

## 2023-06-17 DIAGNOSIS — R262 Difficulty in walking, not elsewhere classified: Secondary | ICD-10-CM

## 2023-06-24 ENCOUNTER — Ambulatory Visit (HOSPITAL_COMMUNITY): Payer: MEDICAID | Attending: Orthopedic Surgery

## 2023-06-24 DIAGNOSIS — R262 Difficulty in walking, not elsewhere classified: Secondary | ICD-10-CM | POA: Insufficient documentation

## 2023-06-24 DIAGNOSIS — M25561 Pain in right knee: Secondary | ICD-10-CM | POA: Diagnosis present

## 2023-06-24 DIAGNOSIS — M25661 Stiffness of right knee, not elsewhere classified: Secondary | ICD-10-CM | POA: Insufficient documentation

## 2023-06-24 NOTE — Therapy (Signed)
 OUTPATIENT PHYSICAL THERAPY LOWER EXTREMITY EVALUATION   Patient Name: Joel Arroyo MRN: [id number] DOB:Jul 13, 1995, 28 y.o., adult Today's Date: 06/24/2023  END OF SESSION:  PT End of Session - 06/24/23 1716     Visit Number 2    Number of Visits 8    Date for PT Re-Evaluation 07/15/23    Authorization Type [employer]; medicaid secondary    Authorization Time Period please check auth    PT Start Time 1518    PT Stop Time 1558    PT Time Calculation (min) 40 min    Activity Tolerance Patient tolerated treatment well    Behavior During Therapy [facility] for tasks assessed/performed              Past Medical History:  Diagnosis Date   Anxiety    Asthma    Bipolar 1 disorder (HCC)    Depression    PTSD (post-traumatic stress disorder)    Seizures (HCC)    Past Surgical History:  Procedure Laterality Date   HERNIA REPAIR     Patient Active Problem List   Diagnosis Date Noted   Intellectual developmental disorder, moderate 07/20/2022   PTSD (post-traumatic stress disorder) 07/20/2022   Social anxiety disorder 07/20/2022   Bipolar II disorder (HCC) 07/20/2022   Verbal auditory hallucinations 04/28/2022   Major depressive disorder 01/29/2019   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   Major depression, recurrent (HCC) 09/05/2018   MDD (major depressive disorder), severe (HCC) 08/17/2018   Self-inflicted laceration of wrist (HCC) 10/07/2017   Cannabis abuse 10/07/2017   Bipolar affective disorder, currently depressed, mild (HCC) 10/13/2016   Seizure-like activity (HCC)    Psychosis (HCC)    Seizures (HCC) 09/06/2016   Suicidal ideation 09/15/2015   Homicidal ideation 09/15/2015   ADHD (attention deficit hyperactivity disorder) 09/14/2015    PCP: Franco Nones, FNP  REFERRING PROVIDER: Vickki Hearing, MD  REFERRING DIAG: [phone] (ICD-10-CM) - Acute pain of right knee M22.41 (ICD-10-CM) - Chondromalacia patellae, right  knee  THERAPY DIAG:  Difficulty in walking, not elsewhere classified  Stiffness of right knee, not elsewhere classified  Acute pain of right knee  Rationale for Evaluation and Treatment: Rehabilitation  ONSET DATE: 2 months  SUBJECTIVE:   SUBJECTIVE STATEMENT: Reports 5/10 pain in R knee. All around knee. Feels hot. Been in recliner before session but worked earlier today. Sharp pain on sides of knee with HEP.  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  Dr. Romeo Apple prescribed Meloxicam and that doesn't help a lot and referred to physical therapy; knee will sometimes "pop" and hurt. Sometimes makes him limp; reports multiple ankle sprains Per chart review has Intellectual development disorder PERTINENT HISTORY: Anxiety Intellectual development disorder Bipolar Depression PTSD PAIN:  Are you having pain? Yes: NPRS scale: 5/10; pop 10/10 Pain location: right knee Pain description: medial and lateral joint line and anterior knee  Aggravating factors: bending Relieving factors: take Tylenol  PRECAUTIONS: None     WEIGHT BEARING RESTRICTIONS: No  FALLS:  Has patient fallen in last 6 months? No  OCCUPATION: works on a farm  PLOF: Independent  PATIENT GOALS: decreased right knee pain  NEXT MD VISIT: PRN  OBJECTIVE:  Note: Objective measures were completed at Evaluation unless otherwise noted.  DIAGNOSTIC FINDINGS:   Narrative & Impression  Imaging of the right  knee   No injury   Alignment looks normal joint spaces maintained patellofemoral joint normal   Normal no right knee      PATIENT SURVEYS:  LEFS 36/80 45%  COGNITION: Overall cognitive status: History of cognitive impairments - at baseline    Intellectual development disorder per chart review   POSTURE: No Significant postural  limitations  PALPATION: Tender right anterior knee and medial and lateral joint line; medial more than lateral.  LOWER EXTREMITY ROM:  Active ROM Right eval Left eval Right  06/24/23  Hip flexion     Hip extension     Hip abduction     Hip adduction     Hip internal rotation     Hip external rotation     Knee flexion 108* 131 110  Knee extension -5 0   Ankle dorsiflexion     Ankle plantarflexion     Ankle inversion     Ankle eversion      (Blank rows = not tested)  LOWER EXTREMITY MMT:  MMT Right eval Left eval  Hip flexion 4* 5  Hip extension 3 4-  Hip abduction 3- 4+  Hip adduction    Hip internal rotation    Hip external rotation    Knee flexion 4- 4+  Knee extension 4-* 5  Ankle dorsiflexion 4+ 5  Ankle plantarflexion    Ankle inversion    Ankle eversion     (Blank rows = not tested)  LOWER EXTREMITY SPECIAL TESTS:  Knee special tests: Anterior drawer test: positive , Patellafemoral apprehension test: negative, and Valgus stress test painful mildly lax compared to left  FUNCTIONAL TESTS:  5 times sit to stand: 14.34 sec using hands to assist  GAIT: Distance walked: 50 ft in clinic Assistive device utilized: None Level of assistance: Modified independence Comments: slight antalgic gait noted; decreased stance right lower extremity                                                                                                                                TREATMENT DATE:  06/24/23: Review of HEP and goals  ROM measurement - see above Heel slides, 5x SAQ, 5x Glute set, 10" holdx10 Ankle pumps, 15x SLR, two attempts (too painful in knee)  06/17/23 physical therapy evaluation and HEP instruction    PATIENT EDUCATION:  Education details: Patient educated on exam findings, POC, scope of PT, HEP, and what to expect next visit. Person educated: Patient Education method: Explanation, Demonstration, and Handouts Education comprehension: verbalized  understanding, returned demonstration, verbal cues required, and tactile cues required  HOME EXERCISE PROGRAM: Access Code: [email or web address] URL: [email or web address] Date: 06/17/2023 Prepared by: AP - Rehab  Exercises - Supine Quad Set  - 2 x daily - 7 x weekly - 1 sets - 10 reps - 5 sec hold - Seated Table Hamstring Stretch  - 2 x daily - 7 x weekly - 3 sets - 5 reps -  20 sec hold - knee bends on the step using rails to hold onto  - 2 x daily - 7 x weekly - 1 sets - 10 reps  Exercises - Supine Gluteal Sets  - 1 x daily - 7 x weekly - 3 sets - 10 reps - Supine Ankle Pumps  - 1 x daily - 7 x weekly - 3 sets - 10 reps  ASSESSMENT:  CLINICAL IMPRESSION: Patient limited during session due to pain. Patient requiring verbal cueing for HEP performance on this date. Educated on decreasing range of motion when performing knee bends at stairs to limit pain. Addition of glute sets and ankle pumps to HEP, with reports of pain in R knee specifically during DF. Re-educ on slightly decreasing range to tolerable limits. Patient with continued pain, swelling, and redness in R knee as compared to L. Patient very TTP on R knee with Sup/Inf and med/lat glides, more so on both lateral sides of patella, inferior aspect of patella, and posterior knee. Educ on importance of daily ice use. Inc mobility on R patella compared to L. Limits ROM and patient's tolerance to mobility. Patient will benefit from skilled physical therapy services to address these deficits to reduce pain and improve level of function with ADLs and functional mobility tasks.  Patient is a 28 y.o. male who was seen today for physical therapy evaluation and treatment for M25.561 (ICD-10-CM) - Acute pain of right knee M22.41 (ICD-10-CM) - Chondromalacia patellae, right knee. Patient demonstrates muscle weakness, reduced ROM, and fascial restrictions which are likely contributing to symptoms of pain and are negatively impacting patient ability  to perform ADLs and functional mobility tasks. Patient will benefit from skilled physical therapy services to address these deficits to reduce pain and improve level of function with ADLs and functional mobility tasks.   OBJECTIVE IMPAIRMENTS: Abnormal gait, decreased activity tolerance, decreased mobility, difficulty walking, decreased ROM, decreased strength, increased fascial restrictions, impaired perceived functional ability, and pain.   ACTIVITY LIMITATIONS: carrying, lifting, bending, standing, squatting, stairs, and locomotion level  PARTICIPATION LIMITATIONS: meal prep, cleaning, community activity, occupation, and yard work  [facility] POTENTIAL: Good  CLINICAL DECISION MAKING: Evolving/moderate complexity  EVALUATION COMPLEXITY: Moderate   GOALS: Goals reviewed with patient? No  SHORT TERM GOALS: Target date: 07/01/2023 patient will be independent with initial HEP  Baseline: Goal status: INITIAL  2.  Patient will report 30% improvement overall   Baseline:  Goal status: INITIAL  3.  Patient will improve right knee flexion by 12 degrees to improve ability to navigate steps Baseline:  Goal status: INITIAL  LONG TERM GOALS: Target date: 07/15/2023  Patient will be independent in self management strategies to improve quality of life and functional outcomes.  Baseline:  Goal status: INITIAL  2.  Patient will report 50% improvement overall   Baseline:  Goal status: INITIAL  3.  Patient will improve 5 times sit to stand score to 11 sec or less without use of Ue's to demonstrate improved functional mobility and  increased leg strength. Baseline: 14.34 sec with Ue's to assist Goal status: INITIAL  4.   Patient will increase right leg MMT's to 4+ to 5/5 to allow navigation of steps without gait deviation or loss of balance   Baseline:  Goal status: INITIAL  5.  Patient will improve LEFS score by 14 points to demonstrate improved perceived function  Baseline:  36/80 Goal status: INITIAL    PLAN:  PT FREQUENCY: 2x/week  PT DURATION: 4 weeks  PLANNED  INTERVENTIONS: 97164- PT Re-evaluation, 97110-Therapeutic exercises, 97530- Therapeutic activity, 97112- Neuromuscular re-education, [phone]- Self Care, 46962- Manual therapy, [phone]- Gait training, [phone]- Orthotic Fit/training, [phone]- Canalith repositioning, [id number]- Aquatic Therapy, [phone]- Splinting, Patient/Family education, Balance training, Stair training, Taping, Dry Needling, Joint mobilization, Joint manipulation, Spinal manipulation, Spinal mobilization, Scar mobilization, and DME instructions.   PLAN FOR NEXT SESSION: right knee mobility and strength, trial Nustep, trial ice at beginning of session, hip flexor strengthening, quad strengthening    5:18 PM, 06/24/23 Winnie Barsky Powell-Butler, PT, DPT [facility] [place]

## 2023-06-25 ENCOUNTER — Encounter (HOSPITAL_COMMUNITY): Payer: Self-pay

## 2023-06-25 ENCOUNTER — Ambulatory Visit (HOSPITAL_COMMUNITY): Payer: MEDICAID

## 2023-06-25 DIAGNOSIS — R262 Difficulty in walking, not elsewhere classified: Secondary | ICD-10-CM | POA: Diagnosis not present

## 2023-06-25 DIAGNOSIS — M25561 Pain in right knee: Secondary | ICD-10-CM

## 2023-06-25 DIAGNOSIS — M25661 Stiffness of right knee, not elsewhere classified: Secondary | ICD-10-CM

## 2023-06-25 NOTE — Therapy (Signed)
 OUTPATIENT PHYSICAL THERAPY LOWER EXTREMITY TREATMENT   Patient Name: Joel Arroyo MRN: [id number] DOB:[birth year], 28 y.o., adult Today's Date: 06/25/2023  END OF SESSION:  PT End of Session - 06/25/23 1508     Visit Number 3    Number of Visits 8    Date for PT Re-Evaluation 07/15/23    Authorization Type [employer]; medicaid secondary    Authorization Time Period [employer] approved 7 visits from 06/17/2023-12/14/2023    Authorization - Visit Number 2    Authorization - Number of Visits 7    Progress Note Due on Visit 8    PT Start Time 1513    PT Stop Time 1601    PT Time Calculation (min) 48 min    Activity Tolerance Patient tolerated treatment well    Behavior During Therapy WFL for tasks assessed/performed              Past Medical History:  Diagnosis Date   Anxiety    Asthma    Bipolar 1 disorder (HCC)    Depression    PTSD (post-traumatic stress disorder)    Seizures (HCC)    Past Surgical History:  Procedure Laterality Date   HERNIA REPAIR     Patient Active Problem List   Diagnosis Date Noted   Intellectual developmental disorder, moderate 07/20/2022   PTSD (post-traumatic stress disorder) 07/20/2022   Social anxiety disorder 07/20/2022   Bipolar II disorder (HCC) 07/20/2022   Verbal auditory hallucinations 04/28/2022   Major depressive disorder 01/29/2019   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   Major depression, recurrent (HCC) 09/05/2018   MDD (major depressive disorder), severe (HCC) 08/17/2018   Self-inflicted laceration of wrist (HCC) 10/07/2017   Cannabis abuse 10/07/2017   Bipolar affective disorder, currently depressed, mild (HCC) 10/13/2016   Seizure-like activity (HCC)    Psychosis (HCC)    Seizures (HCC) 09/06/2016   Suicidal ideation 09/15/2015   Homicidal ideation 09/15/2015   ADHD (attention deficit hyperactivity disorder) 09/14/2015    PCP: Franco Nones, FNP  REFERRING PROVIDER: Vickki Hearing, MD  REFERRING DIAG: [phone] (ICD-10-CM) - Acute pain of right knee M22.41 (ICD-10-CM) - Chondromalacia patellae, right knee  THERAPY DIAG:  Difficulty in walking, not elsewhere classified  Stiffness of right knee, not elsewhere classified  Acute pain of right knee  Rationale for Evaluation and Treatment: Rehabilitation  ONSET DATE: 2 months  SUBJECTIVE:   SUBJECTIVE STATEMENT: Pt stated his knee is feeling much better, has been icing regularly at home.  Continues to have sharp pain on medial surface of Rt knee.    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  Dr. Romeo Apple prescribed Meloxicam and that doesn't help a lot and referred to physical therapy; knee will sometimes "pop" and hurt. Sometimes makes him limp; reports multiple ankle sprains Per chart review has Intellectual development disorder PERTINENT HISTORY: Anxiety Intellectual development disorder Bipolar Depression PTSD PAIN:  Are you having pain? Yes: NPRS scale: 5/10; pop 10/10 Pain location: right knee Pain description: medial and lateral joint line and anterior knee  Aggravating factors: bending Relieving factors: take Tylenol  PRECAUTIONS: None     WEIGHT BEARING RESTRICTIONS: No  FALLS:  Has patient fallen in last 6 months? No  OCCUPATION: works on a farm  PLOF: Independent  PATIENT GOALS: decreased right knee pain  NEXT  MD VISIT: PRN  OBJECTIVE:  Note: Objective measures were completed at Evaluation unless otherwise noted.  DIAGNOSTIC FINDINGS:   Narrative & Impression  Imaging of the right knee   No injury   Alignment looks normal joint spaces maintained patellofemoral joint normal   Normal no right knee      PATIENT SURVEYS:  LEFS 36/80 45%  COGNITION: Overall cognitive status: History of cognitive impairments - at  baseline    Intellectual development disorder per chart review   POSTURE: No Significant postural limitations  PALPATION: Tender right anterior knee and medial and lateral joint line; medial more than lateral.  LOWER EXTREMITY ROM:  Active ROM Right eval Left eval Right  06/24/23  Hip flexion     Hip extension     Hip abduction     Hip adduction     Hip internal rotation     Hip external rotation     Knee flexion 108* 131 110  Knee extension -5 0   Ankle dorsiflexion     Ankle plantarflexion     Ankle inversion     Ankle eversion      (Blank rows = not tested)  LOWER EXTREMITY MMT:  MMT Right eval Left eval  Hip flexion 4* 5  Hip extension 3 4-  Hip abduction 3- 4+  Hip adduction    Hip internal rotation    Hip external rotation    Knee flexion 4- 4+  Knee extension 4-* 5  Ankle dorsiflexion 4+ 5  Ankle plantarflexion    Ankle inversion    Ankle eversion     (Blank rows = not tested)  LOWER EXTREMITY SPECIAL TESTS:  Knee special tests: Anterior drawer test: positive , Patellafemoral apprehension test: negative, and Valgus stress test painful mildly lax compared to left  FUNCTIONAL TESTS:  5 times sit to stand: 14.34 sec using hands to assist  GAIT: Distance walked: 50 ft in clinic Assistive device utilized: None Level of assistance: Modified independence Comments: slight antalgic gait noted; decreased stance right lower extremity                                                                                                                                TREATMENT DATE:  06/25/23: [place] UE/LE x 5' SPM  Supine: Bridge with partial raise for pain control 10x 5" Manual patella mobs with increased pain superior and medial directions Quad sets 10x 3" SAQ 10x 5" pain with bending AROM 5-120 (pain with last 10 degrees flexion).  Sidelying: Clam 10x 5" with RTB around thigh  Selfcare: discussed benefits of ice massage for pain control,  discussed foot support and different shoes with good arch support.   06/24/23: Review of HEP and goals  ROM measurement - see above Heel slides, 5x SAQ, 5x Glute set, 10" holdx10 Ankle pumps, 15x SLR, two attempts (too painful in knee)  06/17/23 physical therapy evaluation and HEP instruction  PATIENT EDUCATION:  Education details: Patient educated on exam findings, POC, scope of PT, HEP, and what to expect next visit. Person educated: Patient Education method: Explanation, Demonstration, and Handouts Education comprehension: verbalized understanding, returned demonstration, verbal cues required, and tactile cues required  HOME EXERCISE PROGRAM: Access Code: [email or web address] URL: [email or web address] Date: 06/17/2023 Prepared by: AP - Rehab  Exercises - Supine Quad Set  - 2 x daily - 7 x weekly - 1 sets - 10 reps - 5 sec hold - Seated Table Hamstring Stretch  - 2 x daily - 7 x weekly - 3 sets - 5 reps - 20 sec hold - knee bends on the step using rails to hold onto  - 2 x daily - 7 x weekly - 1 sets - 10 reps  Exercises - Supine Gluteal Sets  - 1 x daily - 7 x weekly - 3 sets - 10 reps - Supine Ankle Pumps  - 1 x daily - 7 x weekly - 3 sets - 10 reps  ASSESSMENT:  CLINICAL IMPRESSION: Session focus with knee mobility and proximal strengthening.  Pt limited by pain with flexion based exercises, monitored through session.  Reviewed RICE techniques for pain and edema control also discussed ice massage for more direct pain relief.  Good patella mobility all directions but reports of increased pain with superior and medial direction.  Added hip strengthening exercises to POC with good tolerance, added to HEP with printout given for good follow thru.  AROM 5-120 degrees with increased pain last 10 degrees of flexion.  EOS with ice for pain control as pain raised to 6/10 during session, reports of pain reduced following.   Patient is a 28 y.o. male who was seen today for  physical therapy evaluation and treatment for M25.561 (ICD-10-CM) - Acute pain of right knee M22.41 (ICD-10-CM) - Chondromalacia patellae, right knee. Patient demonstrates muscle weakness, reduced ROM, and fascial restrictions which are likely contributing to symptoms of pain and are negatively impacting patient ability to perform ADLs and functional mobility tasks. Patient will benefit from skilled physical therapy services to address these deficits to reduce pain and improve level of function with ADLs and functional mobility tasks.   OBJECTIVE IMPAIRMENTS: Abnormal gait, decreased activity tolerance, decreased mobility, difficulty walking, decreased ROM, decreased strength, increased fascial restrictions, impaired perceived functional ability, and pain.   ACTIVITY LIMITATIONS: carrying, lifting, bending, standing, squatting, stairs, and locomotion level  PARTICIPATION LIMITATIONS: meal prep, cleaning, community activity, occupation, and yard work  [facility] POTENTIAL: Good  CLINICAL DECISION MAKING: Evolving/moderate complexity  EVALUATION COMPLEXITY: Moderate   GOALS: Goals reviewed with patient? No  SHORT TERM GOALS: Target date: 07/01/2023 patient will be independent with initial HEP  Baseline: Goal status: INITIAL  2.  Patient will report 30% improvement overall   Baseline:  Goal status: INITIAL  3.  Patient will improve right knee flexion by 12 degrees to improve ability to navigate steps Baseline:  Goal status: INITIAL  LONG TERM GOALS: Target date: 07/15/2023  Patient will be independent in self management strategies to improve quality of life and functional outcomes.  Baseline:  Goal status: INITIAL  2.  Patient will report 50% improvement overall   Baseline:  Goal status: INITIAL  3.  Patient will improve 5 times sit to stand score to 11 sec or less without use of Ue's to demonstrate improved functional mobility and  increased leg strength. Baseline: 14.34 sec  with Ue's to assist Goal status: INITIAL  4.   Patient will  increase right leg MMT's to 4+ to 5/5 to allow navigation of steps without gait deviation or loss of balance   Baseline:  Goal status: INITIAL  5.  Patient will improve LEFS score by 14 points to demonstrate improved perceived function  Baseline: 36/80 Goal status: INITIAL    PLAN:  PT FREQUENCY: 2x/week  PT DURATION: 4 weeks  PLANNED INTERVENTIONS: 97164- PT Re-evaluation, 97110-Therapeutic exercises, 97530- Therapeutic activity, 97112- Neuromuscular re-education, 97535- Self Care, 44010- Manual therapy, [phone]- Gait training, [phone]- Orthotic Fit/training, [phone]- Canalith repositioning, [id number]- Aquatic Therapy, [phone]- Splinting, Patient/Family education, Balance training, Stair training, Taping, Dry Needling, Joint mobilization, Joint manipulation, Spinal manipulation, Spinal mobilization, Scar mobilization, and DME instructions.   PLAN FOR NEXT SESSION: right knee mobility and strength, trial Nustep, trial ice at beginning of session, hip flexor strengthening, quad strengthening   Becky Sax, LPTA/CLT; CBIS [phone]  4:18 PM, 06/25/23

## 2023-07-01 ENCOUNTER — Ambulatory Visit (HOSPITAL_COMMUNITY): Payer: MEDICAID

## 2023-07-01 ENCOUNTER — Encounter (HOSPITAL_COMMUNITY): Payer: Self-pay

## 2023-07-01 DIAGNOSIS — M25661 Stiffness of right knee, not elsewhere classified: Secondary | ICD-10-CM

## 2023-07-01 DIAGNOSIS — R262 Difficulty in walking, not elsewhere classified: Secondary | ICD-10-CM | POA: Diagnosis not present

## 2023-07-01 DIAGNOSIS — M25561 Pain in right knee: Secondary | ICD-10-CM

## 2023-07-01 NOTE — Therapy (Signed)
 OUTPATIENT PHYSICAL THERAPY LOWER EXTREMITY TREATMENT   Patient Name: Joel Arroyo MRN: [id number] DOB:1995/08/18, 28 y.o., adult Today's Date: 07/01/2023  END OF SESSION:  PT End of Session - 07/01/23 1726     Visit Number 4    Number of Visits 8    Date for PT Re-Evaluation 07/15/23    Authorization Type [employer]; medicaid secondary    Authorization Time Period vaya approved 7 visits from 06/17/2023-12/14/2023    Authorization - Visit Number 3    Authorization - Number of Visits 7    Progress Note Due on Visit 8    PT Start Time 1645    PT Stop Time 1727    PT Time Calculation (min) 42 min    Activity Tolerance Patient tolerated treatment well    Behavior During Therapy WFL for tasks assessed/performed               Past Medical History:  Diagnosis Date   Anxiety    Asthma    Bipolar 1 disorder (HCC)    Depression    PTSD (post-traumatic stress disorder)    Seizures (HCC)    Past Surgical History:  Procedure Laterality Date   HERNIA REPAIR     Patient Active Problem List   Diagnosis Date Noted   Intellectual developmental disorder, moderate 07/20/2022   PTSD (post-traumatic stress disorder) 07/20/2022   Social anxiety disorder 07/20/2022   Bipolar II disorder (HCC) 07/20/2022   Verbal auditory hallucinations 04/28/2022   Major depressive disorder 01/29/2019   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   Major depression, recurrent (HCC) 09/05/2018   MDD (major depressive disorder), severe (HCC) 08/17/2018   Self-inflicted laceration of wrist (HCC) 10/07/2017   Cannabis abuse 10/07/2017   Bipolar affective disorder, currently depressed, mild (HCC) 10/13/2016   Seizure-like activity (HCC)    Psychosis (HCC)    Seizures (HCC) 09/06/2016   Suicidal ideation 09/15/2015   Homicidal ideation 09/15/2015   ADHD (attention deficit hyperactivity disorder) 09/14/2015    PCP: Franco Nones, FNP  REFERRING PROVIDER:  Vickki Hearing, MD  REFERRING DIAG: [phone] (ICD-10-CM) - Acute pain of right knee M22.41 (ICD-10-CM) - Chondromalacia patellae, right knee  THERAPY DIAG:  Difficulty in walking, not elsewhere classified  Stiffness of right knee, not elsewhere classified  Acute pain of right knee  Rationale for Evaluation and Treatment: Rehabilitation  ONSET DATE: 2 months  SUBJECTIVE:   SUBJECTIVE STATEMENT: Patient reports pain rating of 4/10 upon presentation, sharp pain on sides of knee. Patient reports compliance with HEP, able to complete 7/7 days a week.     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  Dr. Romeo Apple prescribed Meloxicam and that doesn't help a lot and referred to physical therapy; knee will sometimes "pop" and hurt. Sometimes makes him limp; reports multiple ankle sprains Per chart review has Intellectual development disorder PERTINENT HISTORY: Anxiety Intellectual development disorder Bipolar Depression PTSD PAIN:  Are you having pain? Yes: NPRS scale: 5/10; pop 10/10 Pain location: right knee Pain description: medial and lateral joint line and anterior knee  Aggravating factors: bending Relieving factors: take Tylenol  PRECAUTIONS: None     WEIGHT BEARING RESTRICTIONS: No  FALLS:  Has patient fallen in last 6 months? No  OCCUPATION: works on a farm  PLOF: Independent  PATIENT GOALS: decreased right knee pain  NEXT MD VISIT: PRN  OBJECTIVE:  Note: Objective measures were completed at Evaluation unless otherwise noted.  DIAGNOSTIC FINDINGS:   Narrative & Impression  Imaging of the right knee   No injury   Alignment looks normal joint spaces maintained patellofemoral joint normal   Normal no right knee      PATIENT SURVEYS:  LEFS 36/80 45%  COGNITION: Overall cognitive status: History of cognitive  impairments - at baseline    Intellectual development disorder per chart review   POSTURE: No Significant postural limitations  PALPATION: Tender right anterior knee and medial and lateral joint line; medial more than lateral.  LOWER EXTREMITY ROM:  Active ROM Right eval Left eval Right  06/24/23  Hip flexion     Hip extension     Hip abduction     Hip adduction     Hip internal rotation     Hip external rotation     Knee flexion 108* 131 110  Knee extension -5 0   Ankle dorsiflexion     Ankle plantarflexion     Ankle inversion     Ankle eversion      (Blank rows = not tested)  LOWER EXTREMITY MMT:  MMT Right eval Left eval  Hip flexion 4* 5  Hip extension 3 4-  Hip abduction 3- 4+  Hip adduction    Hip internal rotation    Hip external rotation    Knee flexion 4- 4+  Knee extension 4-* 5  Ankle dorsiflexion 4+ 5  Ankle plantarflexion    Ankle inversion    Ankle eversion     (Blank rows = not tested)  LOWER EXTREMITY SPECIAL TESTS:  Knee special tests: Anterior drawer test: positive , Patellafemoral apprehension test: negative, and Valgus stress test painful mildly lax compared to left  FUNCTIONAL TESTS:  5 times sit to stand: 14.34 sec using hands to assist  GAIT: Distance walked: 50 ft in clinic Assistive device utilized: None Level of assistance: Modified independence Comments: slight antalgic gait noted; decreased stance right lower extremity                                                                                                                                TREATMENT DATE:  07/01/2023  Manual Therapy: -Patellar mobs all directions (pain in medial and lateral planes)  Therapeutic Exercise: -Supine bridges 2 sets of 10 reps, 3 second holds, RTB, pt cued for max hip extension -Clamshells, 1 sets of 10 reps bilaterally, RTB -Lateral stepping 3 laps 14 steps per lap, with RTB around ankles, pt cued for upright posture -Towel slide lunges,  side and back, 2 set of 5 reps, pt cued for core activation and upright posture, unable to do second set to the side due to pain   Therapeutic Activity: -Sit to stands to SLS on RLE, yellow ball trampoline toss, 2 sets of 7 reps, pt cued for core staggered stance -Step up  and overs on 8 inch step, 2 sets of 5 reps with max -Sled pushes 70 feet per lap, 2 laps, 40 lbs on sled, pt cued for controlled movement, increased pain with pulling   06/25/23:  [employer] UE/LE x 5' SPM  Supine: Bridge with partial raise for pain control 10x 5" Manual patella mobs with increased pain superior and medial directions Quad sets 10x 3" SAQ 10x 5" pain with bending AROM 5-120 (pain with last 10 degrees flexion).  Sidelying: Clam 10x 5" with RTB around thigh  Selfcare: discussed benefits of ice massage for pain control, discussed foot support and different shoes with good arch support.   06/24/23: Review of HEP and goals  ROM measurement - see above Heel slides, 5x SAQ, 5x Glute set, 10" holdx10 Ankle pumps, 15x SLR, two attempts (too painful in knee)  06/17/23 physical therapy evaluation and HEP instruction    PATIENT EDUCATION:  Education details: Patient educated on exam findings, POC, scope of PT, HEP, and what to expect next visit. Person educated: Patient Education method: Explanation, Demonstration, and Handouts Education comprehension: verbalized understanding, returned demonstration, verbal cues required, and tactile cues required  HOME EXERCISE PROGRAM: Access Code: [email or web address] URL: [email or web address] Date: 06/17/2023 Prepared by: AP - Rehab  Exercises - Supine Quad Set  - 2 x daily - 7 x weekly - 1 sets - 10 reps - 5 sec hold - Seated Table Hamstring Stretch  - 2 x daily - 7 x weekly - 3 sets - 5 reps - 20 sec hold - knee bends on the step using rails to hold onto  - 2 x daily - 7 x weekly - 1 sets - 10 reps  Exercises - Supine Gluteal Sets  - 1 x  daily - 7 x weekly - 3 sets - 10 reps - Supine Ankle Pumps  - 1 x daily - 7 x weekly - 3 sets - 10 reps  ASSESSMENT:  CLINICAL IMPRESSION: Patient continues to demonstrate decreased RLE strength, decreased gait quality and increased R knee pain. Patient also continues to demonstrate decreased tolerance to patellar mobs to R knee cap during manual therapy. Patient able to progress dynamic balance and core activation exercises today with good performance with lunging movements and sled pushing/pulls with verbal cueing. Patient would continue to benefit from skilled physical therapy for decreased pain with ambulation, increased RLE strength, and improved SLS capabilities for improved quality of life, improved independence with gait training and continued progress towards therapy goals.   Patient is a 28 y.o. male who was seen today for physical therapy evaluation and treatment for M25.561 (ICD-10-CM) - Acute pain of right knee M22.41 (ICD-10-CM) - Chondromalacia patellae, right knee. Patient demonstrates muscle weakness, reduced ROM, and fascial restrictions which are likely contributing to symptoms of pain and are negatively impacting patient ability to perform ADLs and functional mobility tasks. Patient will benefit from skilled physical therapy services to address these deficits to reduce pain and improve level of function with ADLs and functional mobility tasks.   OBJECTIVE IMPAIRMENTS: Abnormal gait, decreased activity tolerance, decreased mobility, difficulty walking, decreased ROM, decreased strength, increased fascial restrictions, impaired perceived functional ability, and pain.   ACTIVITY LIMITATIONS: carrying, lifting, bending, standing, squatting, stairs, and locomotion level  PARTICIPATION LIMITATIONS: meal prep, cleaning, community activity, occupation, and yard work  [facility] POTENTIAL: Good  CLINICAL DECISION MAKING: Evolving/moderate complexity  EVALUATION COMPLEXITY:  Moderate   GOALS: Goals reviewed with patient? No  SHORT TERM GOALS: Target date: 07/01/2023  patient will be independent with initial HEP  Baseline: Goal status: INITIAL  2.  Patient will report 30% improvement overall   Baseline:  Goal status: INITIAL  3.  Patient will improve right knee flexion by 12 degrees to improve ability to navigate steps Baseline:  Goal status: INITIAL  LONG TERM GOALS: Target date: 07/15/2023  Patient will be independent in self management strategies to improve quality of life and functional outcomes.  Baseline:  Goal status: INITIAL  2.  Patient will report 50% improvement overall   Baseline:  Goal status: INITIAL  3.  Patient will improve 5 times sit to stand score to 11 sec or less without use of Ue's to demonstrate improved functional mobility and  increased leg strength. Baseline: 14.34 sec with Ue's to assist Goal status: INITIAL  4.   Patient will increase right leg MMT's to 4+ to 5/5 to allow navigation of steps without gait deviation or loss of balance   Baseline:  Goal status: INITIAL  5.  Patient will improve LEFS score by 14 points to demonstrate improved perceived function  Baseline: 36/80 Goal status: INITIAL    PLAN:  PT FREQUENCY: 2x/week  PT DURATION: 4 weeks  PLANNED INTERVENTIONS: 97164- PT Re-evaluation, 97110-Therapeutic exercises, 97530- Therapeutic activity, 97112- Neuromuscular re-education, 97535- Self Care, 16109- Manual therapy, [phone]- Gait training, [phone]- Orthotic Fit/training, [phone]- Canalith repositioning, [id number]- Aquatic Therapy, [phone]- Splinting, Patient/Family education, Balance training, Stair training, Taping, Dry Needling, Joint mobilization, Joint manipulation, Spinal manipulation, Spinal mobilization, Scar mobilization, and DME instructions.   PLAN FOR NEXT SESSION: right knee mobility and strength, trial Nustep, trial ice at beginning of session, hip flexor strengthening, quad  strengthening   Luz Lex, PT, DPT [facility] Office: [phone] 5:31 PM, 07/01/23

## 2023-07-02 ENCOUNTER — Ambulatory Visit (HOSPITAL_COMMUNITY): Payer: MEDICAID

## 2023-07-02 ENCOUNTER — Encounter (HOSPITAL_COMMUNITY): Payer: Self-pay

## 2023-07-02 DIAGNOSIS — M25561 Pain in right knee: Secondary | ICD-10-CM

## 2023-07-02 DIAGNOSIS — M25661 Stiffness of right knee, not elsewhere classified: Secondary | ICD-10-CM

## 2023-07-02 DIAGNOSIS — R262 Difficulty in walking, not elsewhere classified: Secondary | ICD-10-CM

## 2023-07-02 NOTE — Therapy (Signed)
 OUTPATIENT PHYSICAL THERAPY LOWER EXTREMITY TREATMENT   Patient Name: RENE SIZELOVE MRN: [id number] DOB:06-04-1995, 28 y.o., adult Today's Date: 07/02/2023  END OF SESSION:  PT End of Session - 07/02/23 1524     Visit Number 5    Number of Visits 8    Date for PT Re-Evaluation 07/15/23    Authorization Type [employer]; medicaid secondary    Authorization Time Period vaya approved 7 visits from 06/17/2023-12/14/2023    Authorization - Visit Number 4    Authorization - Number of Visits 7    Progress Note Due on Visit 8    PT Start Time 1523    PT Stop Time 1602    PT Time Calculation (min) 39 min    Activity Tolerance Patient tolerated treatment well    Behavior During Therapy WFL for tasks assessed/performed               Past Medical History:  Diagnosis Date   Anxiety    Asthma    Bipolar 1 disorder (HCC)    Depression    PTSD (post-traumatic stress disorder)    Seizures (HCC)    Past Surgical History:  Procedure Laterality Date   HERNIA REPAIR     Patient Active Problem List   Diagnosis Date Noted   Intellectual developmental disorder, moderate 07/20/2022   PTSD (post-traumatic stress disorder) 07/20/2022   Social anxiety disorder 07/20/2022   Bipolar II disorder (HCC) 07/20/2022   Verbal auditory hallucinations 04/28/2022   Major depressive disorder 01/29/2019   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   Major depression, recurrent (HCC) 09/05/2018   MDD (major depressive disorder), severe (HCC) 08/17/2018   Self-inflicted laceration of wrist (HCC) 10/07/2017   Cannabis abuse 10/07/2017   Bipolar affective disorder, currently depressed, mild (HCC) 10/13/2016   Seizure-like activity (HCC)    Psychosis (HCC)    Seizures (HCC) 09/06/2016   Suicidal ideation 09/15/2015   Homicidal ideation 09/15/2015   ADHD (attention deficit hyperactivity disorder) 09/14/2015    PCP: Franco Nones, FNP  REFERRING PROVIDER:  Vickki Hearing, MD  REFERRING DIAG: [phone] (ICD-10-CM) - Acute pain of right knee M22.41 (ICD-10-CM) - Chondromalacia patellae, right knee  THERAPY DIAG:  Difficulty in walking, not elsewhere classified  Stiffness of right knee, not elsewhere classified  Acute pain of right knee  Rationale for Evaluation and Treatment: Rehabilitation  ONSET DATE: 2 months  SUBJECTIVE:   SUBJECTIVE STATEMENT: 07/02/23:  Knee is feeling a lot better, most difficulty with the bending.  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  Dr. Romeo Apple prescribed Meloxicam and that doesn't help a lot and referred to physical therapy; knee will sometimes "pop" and hurt. Sometimes makes him limp; reports multiple ankle sprains Per chart review has Intellectual development disorder PERTINENT HISTORY: Anxiety Intellectual development disorder Bipolar Depression PTSD PAIN:  Are you having pain? Yes: NPRS scale: 5/10; pop 10/10 Pain location: right knee Pain description: medial and lateral joint line and anterior knee  Aggravating factors: bending Relieving factors: take Tylenol  PRECAUTIONS: None     WEIGHT BEARING RESTRICTIONS: No  FALLS:  Has patient fallen in last 6 months? No  OCCUPATION: works on a farm  PLOF: Independent  PATIENT GOALS: decreased right knee pain  NEXT MD VISIT: PRN  OBJECTIVE:  Note: Objective measures were completed  at Evaluation unless otherwise noted.  DIAGNOSTIC FINDINGS:   Narrative & Impression  Imaging of the right knee   No injury   Alignment looks normal joint spaces maintained patellofemoral joint normal   Normal no right knee      PATIENT SURVEYS:  LEFS 36/80 45%  COGNITION: Overall cognitive status: History of cognitive impairments - at baseline    Intellectual development disorder per chart  review   POSTURE: No Significant postural limitations  PALPATION: Tender right anterior knee and medial and lateral joint line; medial more than lateral.  LOWER EXTREMITY ROM:  Active ROM Right eval Left eval Right  06/24/23  Hip flexion     Hip extension     Hip abduction     Hip adduction     Hip internal rotation     Hip external rotation     Knee flexion 108* 131 110  Knee extension -5 0   Ankle dorsiflexion     Ankle plantarflexion     Ankle inversion     Ankle eversion      (Blank rows = not tested)  LOWER EXTREMITY MMT:  MMT Right eval Left eval  Hip flexion 4* 5  Hip extension 3 4-  Hip abduction 3- 4+  Hip adduction    Hip internal rotation    Hip external rotation    Knee flexion 4- 4+  Knee extension 4-* 5  Ankle dorsiflexion 4+ 5  Ankle plantarflexion    Ankle inversion    Ankle eversion     (Blank rows = not tested)  LOWER EXTREMITY SPECIAL TESTS:  Knee special tests: Anterior drawer test: positive , Patellafemoral apprehension test: negative, and Valgus stress test painful mildly lax compared to left  FUNCTIONAL TESTS:  5 times sit to stand: 14.34 sec using hands to assist  GAIT: Distance walked: 50 ft in clinic Assistive device utilized: None Level of assistance: Modified independence Comments: slight antalgic gait noted; decreased stance right lower extremity                                                                                                                                TREATMENT DATE:  07/02/23:  Bike seat 13 x 5' Standing: Knee drive on 95AO step 5x 10" Squats front of chair, cueing for mechanics STS eccentric control 10x no HHA Posterior lunges with front leg on 4in step, posterior leg onto 2 pieces of foam STS to SLS alternating LE, yellow ball trampoline toss  Prone: Quad stretch 3x 30 with rope  Supine: AROM: 125 degrees flexion   07/01/2023  Manual Therapy: -Patellar mobs all directions (pain in medial  and lateral planes)  Therapeutic Exercise: -Supine bridges 2 sets of 10 reps, 3 second holds, RTB, pt cued for max hip extension -Clamshells, 1 sets of 10 reps bilaterally, RTB -Lateral stepping 3 laps 14 steps per lap, with RTB around ankles, pt cued for upright posture -Towel slide  lunges, side and back, 2 set of 5 reps, pt cued for core activation and upright posture, unable to do second set to the side due to pain   Therapeutic Activity: -Sit to stands to SLS on RLE, yellow ball trampoline toss, 2 sets of 7 reps, pt cued for core staggered stance -Step up and overs on 8 inch step, 2 sets of 5 reps with max -Sled pushes 70 feet per lap, 2 laps, 40 lbs on sled, pt cued for controlled movement, increased pain with pulling   06/25/23:  [employer] UE/LE x 5' SPM  Supine: Bridge with partial raise for pain control 10x 5" Manual patella mobs with increased pain superior and medial directions Quad sets 10x 3" SAQ 10x 5" pain with bending AROM 5-120 (pain with last 10 degrees flexion).  Sidelying: Clam 10x 5" with RTB around thigh  Selfcare: discussed benefits of ice massage for pain control, discussed foot support and different shoes with good arch support.   06/24/23: Review of HEP and goals  ROM measurement - see above Heel slides, 5x SAQ, 5x Glute set, 10" holdx10 Ankle pumps, 15x SLR, two attempts (too painful in knee)  06/17/23 physical therapy evaluation and HEP instruction    PATIENT EDUCATION:  Education details: Patient educated on exam findings, POC, scope of PT, HEP, and what to expect next visit. Person educated: Patient Education method: Explanation, Demonstration, and Handouts Education comprehension: verbalized understanding, returned demonstration, verbal cues required, and tactile cues required  HOME EXERCISE PROGRAM: Access Code: [email or web address] URL: [email or web address] Date: 06/17/2023 Prepared by: AP - Rehab  Exercises - Supine  Quad Set  - 2 x daily - 7 x weekly - 1 sets - 10 reps - 5 sec hold - Seated Table Hamstring Stretch  - 2 x daily - 7 x weekly - 3 sets - 5 reps - 20 sec hold - knee bends on the step using rails to hold onto  - 2 x daily - 7 x weekly - 1 sets - 10 reps  Exercises - Supine Gluteal Sets  - 1 x daily - 7 x weekly - 3 sets - 10 reps - Supine Ankle Pumps  - 1 x daily - 7 x weekly - 3 sets - 10 reps  ASSESSMENT:  CLINICAL IMPRESSION: Began session on bike for knee mobility.  Added knee drives and lunges to promote knee flexion with some cueing for knee alignment and mechanics.  Reports of increased pain though was reduced with additonal step.  Reports pain increased to 6/10.  Added prone quad stretch.  Improved knee flexion to 125 degrees.   Patient is a 28 y.o. male who was seen today for physical therapy evaluation and treatment for M25.561 (ICD-10-CM) - Acute pain of right knee M22.41 (ICD-10-CM) - Chondromalacia patellae, right knee. Patient demonstrates muscle weakness, reduced ROM, and fascial restrictions which are likely contributing to symptoms of pain and are negatively impacting patient ability to perform ADLs and functional mobility tasks. Patient will benefit from skilled physical therapy services to address these deficits to reduce pain and improve level of function with ADLs and functional mobility tasks.   OBJECTIVE IMPAIRMENTS: Abnormal gait, decreased activity tolerance, decreased mobility, difficulty walking, decreased ROM, decreased strength, increased fascial restrictions, impaired perceived functional ability, and pain.   ACTIVITY LIMITATIONS: carrying, lifting, bending, standing, squatting, stairs, and locomotion level  PARTICIPATION LIMITATIONS: meal prep, cleaning, community activity, occupation, and yard work  [facility] POTENTIAL: Good  CLINICAL DECISION MAKING: Evolving/moderate complexity  EVALUATION  COMPLEXITY: Moderate   GOALS: Goals reviewed with patient?  No  SHORT TERM GOALS: Target date: 07/01/2023 patient will be independent with initial HEP  Baseline: Goal status: INITIAL  2.  Patient will report 30% improvement overall   Baseline:  Goal status: INITIAL  3.  Patient will improve right knee flexion by 12 degrees to improve ability to navigate steps Baseline:  Goal status: INITIAL  LONG TERM GOALS: Target date: 07/15/2023  Patient will be independent in self management strategies to improve quality of life and functional outcomes.  Baseline:  Goal status: INITIAL  2.  Patient will report 50% improvement overall   Baseline:  Goal status: INITIAL  3.  Patient will improve 5 times sit to stand score to 11 sec or less without use of Ue's to demonstrate improved functional mobility and  increased leg strength. Baseline: 14.34 sec with Ue's to assist Goal status: INITIAL  4.   Patient will increase right leg MMT's to 4+ to 5/5 to allow navigation of steps without gait deviation or loss of balance   Baseline:  Goal status: INITIAL  5.  Patient will improve LEFS score by 14 points to demonstrate improved perceived function  Baseline: 36/80 Goal status: INITIAL    PLAN:  PT FREQUENCY: 2x/week  PT DURATION: 4 weeks  PLANNED INTERVENTIONS: 97164- PT Re-evaluation, 97110-Therapeutic exercises, 97530- Therapeutic activity, 97112- Neuromuscular re-education, 97535- Self Care, 78295- Manual therapy, [phone]- Gait training, [phone]- Orthotic Fit/training, [phone]- Canalith repositioning, [id number]- Aquatic Therapy, [phone]- Splinting, Patient/Family education, Balance training, Stair training, Taping, Dry Needling, Joint mobilization, Joint manipulation, Spinal manipulation, Spinal mobilization, Scar mobilization, and DME instructions.   PLAN FOR NEXT SESSION: right knee mobility and strength, trial Nustep, trial ice at beginning of session, hip flexor strengthening, quad strengthening   Luz Lex, PT, DPT [facility] Office: [phone] 4:10 PM, 07/02/23

## 2023-07-08 ENCOUNTER — Ambulatory Visit (HOSPITAL_COMMUNITY): Payer: MEDICAID

## 2023-07-08 ENCOUNTER — Encounter (HOSPITAL_COMMUNITY): Payer: Self-pay

## 2023-07-08 DIAGNOSIS — M25561 Pain in right knee: Secondary | ICD-10-CM

## 2023-07-08 DIAGNOSIS — M25661 Stiffness of right knee, not elsewhere classified: Secondary | ICD-10-CM

## 2023-07-08 DIAGNOSIS — R262 Difficulty in walking, not elsewhere classified: Secondary | ICD-10-CM

## 2023-07-08 NOTE — Therapy (Signed)
 OUTPATIENT PHYSICAL THERAPY LOWER EXTREMITY TREATMENT   Patient Name: Joel Arroyo: [id number] DOB:[birth year], 28 y.o., adult Today's Date: 07/08/2023  END OF SESSION:  PT End of Session - 07/08/23 1514     Visit Number 6    Number of Visits 8    Date for PT Re-Evaluation 07/15/23    Authorization Type [employer]; medicaid secondary    Authorization Time Period vaya approved 7 visits from 06/17/2023-12/14/2023    Authorization - Visit Number 5    Authorization - Number of Visits 7    Progress Note Due on Visit 8    PT Start Time 1514    PT Stop Time 1558    PT Time Calculation (min) 44 min    Activity Tolerance Patient tolerated treatment well    Behavior During Therapy WFL for tasks assessed/performed               Past Medical History:  Diagnosis Date   Anxiety    Asthma    Bipolar 1 disorder (HCC)    Depression    PTSD (post-traumatic stress disorder)    Seizures (HCC)    Past Surgical History:  Procedure Laterality Date   HERNIA REPAIR     Patient Active Problem List   Diagnosis Date Noted   Intellectual developmental disorder, moderate 07/20/2022   PTSD (post-traumatic stress disorder) 07/20/2022   Social anxiety disorder 07/20/2022   Bipolar II disorder (HCC) 07/20/2022   Verbal auditory hallucinations 04/28/2022   Major depressive disorder 01/29/2019   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   Major depression, recurrent (HCC) 09/05/2018   MDD (major depressive disorder), severe (HCC) 08/17/2018   Self-inflicted laceration of wrist (HCC) 10/07/2017   Cannabis abuse 10/07/2017   Bipolar affective disorder, currently depressed, mild (HCC) 10/13/2016   Seizure-like activity (HCC)    Psychosis (HCC)    Seizures (HCC) 09/06/2016   Suicidal ideation 09/15/2015   Homicidal ideation 09/15/2015   ADHD (attention deficit hyperactivity disorder) 09/14/2015    PCP: Rueben Cote, FNP  REFERRING PROVIDER:  Darrin Emerald, MD  REFERRING DIAG: [phone] (ICD-10-CM) - Acute pain of right knee M22.41 (ICD-10-CM) - Chondromalacia patellae, right knee  THERAPY DIAG:  Difficulty in walking, not elsewhere classified  Stiffness of right knee, not elsewhere classified  Acute pain of right knee  Rationale for Evaluation and Treatment: Rehabilitation  ONSET DATE: 2 months  SUBJECTIVE:   SUBJECTIVE STATEMENT: Patient report no pain today, but iced before he came. Patient reports he has been able to walk more and bend his leg more with less pain. Reports 7/10 pain during heel slides. But otherwise Exercises going good at home.     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  Dr. Phyllis Breeze prescribed Meloxicam and that doesn't help a lot and referred to physical therapy; knee will sometimes "pop" and hurt. Sometimes makes him limp; reports multiple ankle sprains Per chart review has Intellectual development disorder PERTINENT HISTORY: Anxiety Intellectual development disorder Bipolar Depression PTSD PAIN:  Are you having pain? Yes: NPRS scale: 5/10; pop 10/10 Pain location: right knee Pain description: medial and lateral joint line and anterior knee  Aggravating factors: bending Relieving factors: take Tylenol  PRECAUTIONS: None     WEIGHT BEARING RESTRICTIONS: No  FALLS:  Has patient fallen in last 6 months? No  OCCUPATION:  works on a farm  PLOF: Independent  PATIENT GOALS: decreased right knee pain  NEXT MD VISIT: PRN  OBJECTIVE:  Note: Objective measures were completed at Evaluation unless otherwise noted.  DIAGNOSTIC FINDINGS:   Narrative & Impression  Imaging of the right knee   No injury   Alignment looks normal joint spaces maintained patellofemoral joint normal   Normal no right knee      PATIENT SURVEYS:  LEFS 36/80  45%  COGNITION: Overall cognitive status: History of cognitive impairments - at baseline    Intellectual development disorder per chart review   POSTURE: No Significant postural limitations  PALPATION: Tender right anterior knee and medial and lateral joint line; medial more than lateral.  LOWER EXTREMITY ROM:  Active ROM Right eval Left eval Right  06/24/23  Hip flexion     Hip extension     Hip abduction     Hip adduction     Hip internal rotation     Hip external rotation     Knee flexion 108* 131 110  Knee extension -5 0   Ankle dorsiflexion     Ankle plantarflexion     Ankle inversion     Ankle eversion      (Blank rows = not tested)  LOWER EXTREMITY MMT:  MMT Right eval Left eval  Hip flexion 4* 5  Hip extension 3 4-  Hip abduction 3- 4+  Hip adduction    Hip internal rotation    Hip external rotation    Knee flexion 4- 4+  Knee extension 4-* 5  Ankle dorsiflexion 4+ 5  Ankle plantarflexion    Ankle inversion    Ankle eversion     (Blank rows = not tested)  LOWER EXTREMITY SPECIAL TESTS:  Knee special tests: Anterior drawer test: positive , Patellafemoral apprehension test: negative, and Valgus stress test painful mildly lax compared to left  FUNCTIONAL TESTS:  5 times sit to stand: 14.34 sec using hands to assist  GAIT: Distance walked: 50 ft in clinic Assistive device utilized: None Level of assistance: Modified independence Comments: slight antalgic gait noted; decreased stance right lower extremity                                                                                                                                TREATMENT DATE:  07/08/23: -Bike, seat 11, 5', 3/10 pain  -Squat: 3x10, 1st set NBOS, 2nd set WBOS, 3rd staggered stance, RLE further back  -Backward Lunge: 10x -Forward lunge onto BOSU: 10x -Seated hamstring stretch: 2x30" -Supine thomas/quad stretch: 1' hold bilat -Supine Heel slide w/ towel, 10x   07/02/23: Bike  seat 13 x 5' Standing: Knee drive on 16XW step 5x 10" Squats front of chair, cueing for mechanics STS eccentric control 10x no HHA Posterior lunges with front leg on 4in step, posterior leg onto 2 pieces of foam STS to SLS alternating LE, yellow ball trampoline toss  Prone:  Quad stretch 3x 30 with rope  Supine: AROM: 125 degrees flexion   07/01/2023  Manual Therapy: -Patellar mobs all directions (pain in medial and lateral planes)  Therapeutic Exercise: -Supine bridges 2 sets of 10 reps, 3 second holds, RTB, pt cued for max hip extension -Clamshells, 1 sets of 10 reps bilaterally, RTB -Lateral stepping 3 laps 14 steps per lap, with RTB around ankles, pt cued for upright posture -Towel slide lunges, side and back, 2 set of 5 reps, pt cued for core activation and upright posture, unable to do second set to the side due to pain   Therapeutic Activity: -Sit to stands to SLS on RLE, yellow ball trampoline toss, 2 sets of 7 reps, pt cued for core staggered stance -Step up and overs on 8 inch step, 2 sets of 5 reps with max -Sled pushes 70 feet per lap, 2 laps, 40 lbs on sled, pt cued for controlled movement, increased pain with pulling    PATIENT EDUCATION:  Education details: Patient educated on exam findings, POC, scope of PT, HEP, and what to expect next visit. Person educated: Patient Education method: Explanation, Demonstration, and Handouts Education comprehension: verbalized understanding, returned demonstration, verbal cues required, and tactile cues required  HOME EXERCISE PROGRAM: Access Code: [email or web address] URL: [email or web address] Date: 06/17/2023 Prepared by: AP - Rehab  Exercises - Supine Quad Set  - 2 x daily - 7 x weekly - 1 sets - 10 reps - 5 sec hold - Seated Table Hamstring Stretch  - 2 x daily - 7 x weekly - 3 sets - 5 reps - 20 sec hold - knee bends on the step using rails to hold onto  - 2 x daily - 7 x weekly - 1 sets - 10 reps  Exercises -  Supine Gluteal Sets  - 1 x daily - 7 x weekly - 3 sets - 10 reps - Supine Ankle Pumps  - 1 x daily - 7 x weekly - 3 sets - 10 reps  ASSESSMENT:  CLINICAL IMPRESSION: Patient with warm up on recumbent bike, patient reports 3/10 sharp pain during. Remainder of session spent with progressing LE strengthening program with staggered squats, BOSU lunges. Patient reporting 8/10 R knee pain/soreness following those exercises. Followed with hamstring and quad stretching as well as active assisted heel slides. Patient knee flexion continue measuring 125 deg with inc pain. Patient will benefit from continued skilled physical therapy in order to address continued pain, and dec strength to improve function and QOL.    Patient is a 28 y.o. male who was seen today for physical therapy evaluation and treatment for M25.561 (ICD-10-CM) - Acute pain of right knee M22.41 (ICD-10-CM) - Chondromalacia patellae, right knee. Patient demonstrates muscle weakness, reduced ROM, and fascial restrictions which are likely contributing to symptoms of pain and are negatively impacting patient ability to perform ADLs and functional mobility tasks. Patient will benefit from skilled physical therapy services to address these deficits to reduce pain and improve level of function with ADLs and functional mobility tasks.   OBJECTIVE IMPAIRMENTS: Abnormal gait, decreased activity tolerance, decreased mobility, difficulty walking, decreased ROM, decreased strength, increased fascial restrictions, impaired perceived functional ability, and pain.   ACTIVITY LIMITATIONS: carrying, lifting, bending, standing, squatting, stairs, and locomotion level  PARTICIPATION LIMITATIONS: meal prep, cleaning, community activity, occupation, and yard work  [facility] POTENTIAL: Good  CLINICAL DECISION MAKING: Evolving/moderate complexity  EVALUATION COMPLEXITY: Moderate   GOALS: Goals reviewed with patient? No  SHORT TERM GOALS: Target date:  07/01/2023 patient will be independent with initial HEP  Baseline: Goal status: INITIAL  2.  Patient will report 30% improvement overall   Baseline:  Goal status: INITIAL  3.  Patient will improve right knee flexion by 12 degrees to improve ability to navigate steps Baseline:  Goal status: INITIAL  LONG TERM GOALS: Target date: 07/15/2023  Patient will be independent in self management strategies to improve quality of life and functional outcomes.  Baseline:  Goal status: INITIAL  2.  Patient will report 50% improvement overall   Baseline:  Goal status: INITIAL  3.  Patient will improve 5 times sit to stand score to 11 sec or less without use of Ue's to demonstrate improved functional mobility and  increased leg strength. Baseline: 14.34 sec with Ue's to assist Goal status: INITIAL  4.   Patient will increase right leg MMT's to 4+ to 5/5 to allow navigation of steps without gait deviation or loss of balance   Baseline:  Goal status: INITIAL  5.  Patient will improve LEFS score by 14 points to demonstrate improved perceived function  Baseline: 36/80 Goal status: INITIAL    PLAN:  PT FREQUENCY: 2x/week  PT DURATION: 4 weeks  PLANNED INTERVENTIONS: 97164- PT Re-evaluation, 97110-Therapeutic exercises, 97530- Therapeutic activity, 97112- Neuromuscular re-education, 97535- Self Care, 16109- Manual therapy, [phone]- Gait training, [phone]- Orthotic Fit/training, [phone]- Canalith repositioning, [id number]- Aquatic Therapy, [phone]- Splinting, Patient/Family education, Balance training, Stair training, Taping, Dry Needling, Joint mobilization, Joint manipulation, Spinal manipulation, Spinal mobilization, Scar mobilization, and DME instructions.   PLAN FOR NEXT SESSION: right knee mobility and strength, trial Nustep, trial ice at beginning of session, hip flexor strengthening, quad strengthening   4:04 PM, 07/08/23 Aliviyah Malanga Powell-Butler, PT, DPT [place] [facility]

## 2023-07-10 ENCOUNTER — Ambulatory Visit (HOSPITAL_COMMUNITY): Payer: MEDICAID

## 2023-07-10 ENCOUNTER — Encounter (HOSPITAL_COMMUNITY): Payer: Self-pay

## 2023-07-10 DIAGNOSIS — R262 Difficulty in walking, not elsewhere classified: Secondary | ICD-10-CM | POA: Diagnosis not present

## 2023-07-10 DIAGNOSIS — M25561 Pain in right knee: Secondary | ICD-10-CM

## 2023-07-10 DIAGNOSIS — M25661 Stiffness of right knee, not elsewhere classified: Secondary | ICD-10-CM

## 2023-07-10 NOTE — Therapy (Signed)
 OUTPATIENT PHYSICAL THERAPY LOWER EXTREMITY TREATMENT   Patient Name: Joel Arroyo MRN: [id number] DOB:[birth year], 28 y.o., adult Today's Date: 07/10/2023  END OF SESSION:  PT End of Session - 07/10/23 1516     Visit Number 7    Number of Visits 8    Date for PT Re-Evaluation 07/15/23    Authorization Type [employer]; medicaid secondary    Authorization Time Period vaya approved 7 visits from 06/17/2023-12/14/2023    Authorization - Visit Number 6    Authorization - Number of Visits 7    Progress Note Due on Visit 8    PT Start Time 1516    PT Stop Time 1600    PT Time Calculation (min) 44 min    Activity Tolerance Patient tolerated treatment well    Behavior During Therapy [facility] for tasks assessed/performed                Past Medical History:  Diagnosis Date   Anxiety    Asthma    Bipolar 1 disorder (HCC)    Depression    PTSD (post-traumatic stress disorder)    Seizures (HCC)    Past Surgical History:  Procedure Laterality Date   HERNIA REPAIR     Patient Active Problem List   Diagnosis Date Noted   Intellectual developmental disorder, moderate 07/20/2022   PTSD (post-traumatic stress disorder) 07/20/2022   Social anxiety disorder 07/20/2022   Bipolar II disorder (HCC) 07/20/2022   Verbal auditory hallucinations 04/28/2022   Major depressive disorder 01/29/2019   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   Major depression, recurrent (HCC) 09/05/2018   MDD (major depressive disorder), severe (HCC) 08/17/2018   Self-inflicted laceration of wrist (HCC) 10/07/2017   Cannabis abuse 10/07/2017   Bipolar affective disorder, currently depressed, mild (HCC) 10/13/2016   Seizure-like activity (HCC)    Psychosis (HCC)    Seizures (HCC) 09/06/2016   Suicidal ideation 09/15/2015   Homicidal ideation 09/15/2015   ADHD (attention deficit hyperactivity disorder) 09/14/2015    PCP: Rueben Cote, FNP  REFERRING PROVIDER:  Darrin Emerald, MD  REFERRING DIAG: [phone] (ICD-10-CM) - Acute pain of right knee M22.41 (ICD-10-CM) - Chondromalacia patellae, right knee  THERAPY DIAG:  Difficulty in walking, not elsewhere classified  Stiffness of right knee, not elsewhere classified  Acute pain of right knee  Rationale for Evaluation and Treatment: Rehabilitation  ONSET DATE: 2 months  SUBJECTIVE:   SUBJECTIVE STATEMENT: Pt reports not icing the knee today and it is really sore. 9/10 pain rating upon presentation. 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 Pt reports hitting right knee on corner of a table earlier today. Pt still feels it is getting better since starting therapy.   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  Dr. Phyllis Breeze prescribed Meloxicam  and that doesn't help a lot and referred to physical therapy; knee will sometimes "pop" and hurt. Sometimes makes him limp; reports multiple ankle sprains Per chart review has Intellectual development disorder PERTINENT HISTORY: Anxiety Intellectual development disorder Bipolar Depression PTSD PAIN:  Are you having pain? Yes: NPRS scale: 5/10; pop 10/10 Pain location: right knee Pain description: medial and lateral joint line and anterior knee  Aggravating factors: bending Relieving factors: take Tylenol   PRECAUTIONS: None     WEIGHT BEARING RESTRICTIONS: No  FALLS:  Has patient  fallen in last 6 months? No  OCCUPATION: works on a farm  PLOF: Independent  PATIENT GOALS: decreased right knee pain  NEXT MD VISIT: PRN  OBJECTIVE:  Note: Objective measures were completed at Evaluation unless otherwise noted.  DIAGNOSTIC FINDINGS:   Narrative & Impression  Imaging of the right knee   No injury   Alignment looks normal joint spaces maintained patellofemoral joint normal   Normal no right knee       PATIENT SURVEYS:  LEFS 36/80 45%  COGNITION: Overall cognitive status: History of cognitive impairments - at baseline    Intellectual development disorder per chart review   POSTURE: No Significant postural limitations  PALPATION: Tender right anterior knee and medial and lateral joint line; medial more than lateral.  LOWER EXTREMITY ROM:  Active ROM Right eval Left eval Right  06/24/23  Hip flexion     Hip extension     Hip abduction     Hip adduction     Hip internal rotation     Hip external rotation     Knee flexion 108* 131 110  Knee extension -5 0   Ankle dorsiflexion     Ankle plantarflexion     Ankle inversion     Ankle eversion      (Blank rows = not tested)  LOWER EXTREMITY MMT:  MMT Right eval Left eval  Hip flexion 4* 5  Hip extension 3 4-  Hip abduction 3- 4+  Hip adduction    Hip internal rotation    Hip external rotation    Knee flexion 4- 4+  Knee extension 4-* 5  Ankle dorsiflexion 4+ 5  Ankle plantarflexion    Ankle inversion    Ankle eversion     (Blank rows = not tested)  LOWER EXTREMITY SPECIAL TESTS:  Knee special tests: Anterior drawer test: positive , Patellafemoral apprehension test: negative, and Valgus stress test painful mildly lax compared to left  FUNCTIONAL TESTS:  5 times sit to stand: 14.34 sec using hands to assist  GAIT: Distance walked: 50 ft in clinic Assistive device utilized: None Level of assistance: Modified independence Comments: slight antalgic gait noted; decreased stance right lower extremity                                                                                                                                TREATMENT DATE:  07/10/2023  Therapeutic Exercise: -Bike, 5', decreased speed noted -TKE on 4 inch step, 2 sets of 10 reps, pt cued for upright posture -Leg press, plate 4, 2 sets of 10 reps -Lateral stepping 3 laps 14 steps per lap, with RTB around ankles, pt cued for upright  posture  Therapeutic Activity: -Sit to stands to SLS on RLE, blue ball trampoline toss, 2 sets of 5 reps, pt cued for core staggered stance -Step up and overs on bosu ball, 2 sets of 5 reps, pt cued for slow and controlled descent,  RLE only -Sled pushes 70 feet per lap, 2 laps, 50 lbs on sled, pt cued for controlled movement, upright posture    07/08/23: -Bike, seat 11, 5', 3/10 pain  -Squat: 3x10, 1st set NBOS, 2nd set WBOS, 3rd staggered stance, RLE further back  -Backward Lunge: 10x -Forward lunge onto BOSU: 10x -Seated hamstring stretch: 2x30" -Supine thomas/quad stretch: 1' hold bilat -Supine Heel slide w/ towel, 10x   07/02/23: Bike seat 13 x 5' Standing: Knee drive on 40JW step 5x 10" Squats front of chair, cueing for mechanics STS eccentric control 10x no HHA Posterior lunges with front leg on 4in step, posterior leg onto 2 pieces of foam STS to SLS alternating LE, yellow ball trampoline toss  Prone: Quad stretch 3x 30 with rope  Supine: AROM: 125 degrees flexion   07/01/2023  Manual Therapy: -Patellar mobs all directions (pain in medial and lateral planes)  Therapeutic Exercise: -Supine bridges 2 sets of 10 reps, 3 second holds, RTB, pt cued for max hip extension -Clamshells, 1 sets of 10 reps bilaterally, RTB -Lateral stepping 3 laps 14 steps per lap, with RTB around ankles, pt cued for upright posture -Towel slide lunges, side and back, 2 set of 5 reps, pt cued for core activation and upright posture, unable to do second set to the side due to pain   Therapeutic Activity: -Sit to stands to SLS on RLE, yellow ball trampoline toss, 2 sets of 7 reps, pt cued for core staggered stance -Step up and overs on 8 inch step, 2 sets of 5 reps with max -Sled pushes 70 feet per lap, 2 laps, 40 lbs on sled, pt cued for controlled movement, increased pain with pulling    PATIENT EDUCATION:  Education details: Patient educated on exam findings, POC, scope of PT, HEP, and  what to expect next visit. Person educated: Patient Education method: Explanation, Demonstration, and Handouts Education comprehension: verbalized understanding, returned demonstration, verbal cues required, and tactile cues required  HOME EXERCISE PROGRAM: Access Code: [email or web address] URL: [email or web address] Date: 06/17/2023 Prepared by: AP - Rehab  Exercises - Supine Quad Set  - 2 x daily - 7 x weekly - 1 sets - 10 reps - 5 sec hold - Seated Table Hamstring Stretch  - 2 x daily - 7 x weekly - 3 sets - 5 reps - 20 sec hold - knee bends on the step using rails to hold onto  - 2 x daily - 7 x weekly - 1 sets - 10 reps  Exercises - Supine Gluteal Sets  - 1 x daily - 7 x weekly - 3 sets - 10 reps - Supine Ankle Pumps  - 1 x daily - 7 x weekly - 3 sets - 10 reps  ASSESSMENT:  CLINICAL IMPRESSION: Patient continues to demonstrate increased pain with activities involving R knee mobility. Pt also decreased RLE strength, decreased gait quality and balance. Patient also demonstrates decreased velocity on bike and sled pushing/pull during today's session. Patient able to progress dynamic balance and LE strengthening exercises today with leg press and progressed step over, good performance with verbal cueing. Patient would continue to benefit from skilled physical therapy for decreased pain in R knee, increased RLE strength, and improved balance for improved quality of life, improved independence with ADLs and continued progress towards therapy goals.    Patient is a 28 y.o. male who was seen today for physical therapy evaluation and treatment for M25.561 (ICD-10-CM) - Acute pain of right knee M22.41 (ICD-10-CM) - Chondromalacia  patellae, right knee. Patient demonstrates muscle weakness, reduced ROM, and fascial restrictions which are likely contributing to symptoms of pain and are negatively impacting patient ability to perform ADLs and functional mobility tasks. Patient will benefit from  skilled physical therapy services to address these deficits to reduce pain and improve level of function with ADLs and functional mobility tasks.   OBJECTIVE IMPAIRMENTS: Abnormal gait, decreased activity tolerance, decreased mobility, difficulty walking, decreased ROM, decreased strength, increased fascial restrictions, impaired perceived functional ability, and pain.   ACTIVITY LIMITATIONS: carrying, lifting, bending, standing, squatting, stairs, and locomotion level  PARTICIPATION LIMITATIONS: meal prep, cleaning, community activity, occupation, and yard work  [facility] POTENTIAL: Good  CLINICAL DECISION MAKING: Evolving/moderate complexity  EVALUATION COMPLEXITY: Moderate   GOALS: Goals reviewed with patient? No  SHORT TERM GOALS: Target date: 07/01/2023 patient will be independent with initial HEP  Baseline: Goal status: INITIAL  2.  Patient will report 30% improvement overall   Baseline:  Goal status: INITIAL  3.  Patient will improve right knee flexion by 12 degrees to improve ability to navigate steps Baseline:  Goal status: INITIAL  LONG TERM GOALS: Target date: 07/15/2023  Patient will be independent in self management strategies to improve quality of life and functional outcomes.  Baseline:  Goal status: INITIAL  2.  Patient will report 50% improvement overall   Baseline:  Goal status: INITIAL  3.  Patient will improve 5 times sit to stand score to 11 sec or less without use of Ue's to demonstrate improved functional mobility and  increased leg strength. Baseline: 14.34 sec with Ue's to assist Goal status: INITIAL  4.   Patient will increase right leg MMT's to 4+ to 5/5 to allow navigation of steps without gait deviation or loss of balance   Baseline:  Goal status: INITIAL  5.  Patient will improve LEFS score by 14 points to demonstrate improved perceived function  Baseline: 36/80 Goal status: INITIAL    PLAN:  PT FREQUENCY: 2x/week  PT  DURATION: 4 weeks  PLANNED INTERVENTIONS: 97164- PT Re-evaluation, 97110-Therapeutic exercises, 97530- Therapeutic activity, 97112- Neuromuscular re-education, 97535- Self Care, 19147- Manual therapy, [phone]- Gait training, [phone]- Orthotic Fit/training, [phone]- Canalith repositioning, [id number]- Aquatic Therapy, [phone]- Splinting, Patient/Family education, Balance training, Stair training, Taping, Dry Needling, Joint mobilization, Joint manipulation, Spinal manipulation, Spinal mobilization, Scar mobilization, and DME instructions.   PLAN FOR NEXT SESSION: right knee mobility and strength, trial Nustep, trial ice at beginning of session, hip flexor strengthening, quad strengthening, introduce plan for discharge to HEP   Armond Bertin, PT, DPT [facility] Office: [phone] 3:57 PM, 07/10/23

## 2023-07-14 ENCOUNTER — Encounter (HOSPITAL_COMMUNITY): Payer: MEDICAID

## 2023-07-16 ENCOUNTER — Encounter (HOSPITAL_COMMUNITY): Payer: MEDICAID

## 2023-07-23 ENCOUNTER — Ambulatory Visit (HOSPITAL_COMMUNITY): Payer: MEDICAID | Attending: Orthopedic Surgery

## 2023-07-23 ENCOUNTER — Encounter (HOSPITAL_COMMUNITY): Payer: Self-pay

## 2023-07-23 DIAGNOSIS — M25661 Stiffness of right knee, not elsewhere classified: Secondary | ICD-10-CM | POA: Diagnosis present

## 2023-07-23 DIAGNOSIS — R262 Difficulty in walking, not elsewhere classified: Secondary | ICD-10-CM | POA: Diagnosis present

## 2023-07-23 DIAGNOSIS — M25561 Pain in right knee: Secondary | ICD-10-CM

## 2023-07-23 NOTE — Therapy (Signed)
 OUTPATIENT PHYSICAL THERAPY LOWER EXTREMITY TREATMENT/ DISCHARGE PHYSICAL THERAPY DISCHARGE SUMMARY  Visits from Start of Care: 8  Current functional level related to goals / functional outcomes: See below   Remaining deficits: See below   Education / Equipment: HEP   Patient agrees to discharge. Patient goals were met. Patient is being discharged due to meeting the stated rehab goals.    Patient Name: Joel Arroyo MRN: [id number] DOB:[birth year], 28 y.o., adult Today's Date: 07/23/2023  END OF SESSION:  PT End of Session - 07/23/23 1535     Visit Number 8    Number of Visits 8    Date for PT Re-Evaluation 07/15/23    Authorization Type [employer]; medicaid secondary    Authorization Time Period [employer] approved 7 visits from 06/17/2023-12/14/2023    Authorization - Visit Number 7    Authorization - Number of Visits 7    Progress Note Due on Visit 8    PT Start Time 1532   Late sign in   PT Stop Time 1558    PT Time Calculation (min) 26 min    Activity Tolerance Patient tolerated treatment well    Behavior During Therapy [facility] for tasks assessed/performed                Past Medical History:  Diagnosis Date   Anxiety    Asthma    Bipolar 1 disorder (HCC)    Depression    PTSD (post-traumatic stress disorder)    Seizures (HCC)    Past Surgical History:  Procedure Laterality Date   HERNIA REPAIR     Patient Active Problem List   Diagnosis Date Noted   Intellectual developmental disorder, moderate 07/20/2022   PTSD (post-traumatic stress disorder) 07/20/2022   Social anxiety disorder 07/20/2022   Bipolar II disorder (HCC) 07/20/2022   Verbal auditory hallucinations 04/28/2022   Major depressive disorder 01/29/2019   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   Major depression, recurrent (HCC) 09/05/2018   MDD (major depressive disorder), severe (HCC) 08/17/2018   Self-inflicted laceration of wrist (HCC) 10/07/2017    Cannabis abuse 10/07/2017   Bipolar affective disorder, currently depressed, mild (HCC) 10/13/2016   Seizure-like activity (HCC)    Psychosis (HCC)    Seizures (HCC) 09/06/2016   Suicidal ideation 09/15/2015   Homicidal ideation 09/15/2015   ADHD (attention deficit hyperactivity disorder) 09/14/2015    PCP: Rueben Cote, FNP  REFERRING PROVIDER: Darrin Emerald, MD  REFERRING DIAG: [phone] (ICD-10-CM) - Acute pain of right knee M22.41 (ICD-10-CM) - Chondromalacia patellae, right knee  THERAPY DIAG:  Difficulty in walking, not elsewhere classified  Stiffness of right knee, not elsewhere classified  Acute pain of right knee  Rationale for Evaluation and Treatment: Rehabilitation  ONSET DATE: 2 months  SUBJECTIVE:   SUBJECTIVE STATEMENT: Reports he has been pain free for over a week and feels he has improved by 98% since beginning therapy.  LE are sore some, has been compliant with HEP daily.   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  Dr. Phyllis Breeze prescribed Meloxicam  and that doesn't help a lot and referred to physical therapy; knee will sometimes "pop" and hurt. Sometimes makes him limp; reports multiple ankle sprains Per chart review has Intellectual development disorder PERTINENT HISTORY: Anxiety Intellectual development disorder Bipolar Depression PTSD PAIN:  Are you having pain?  Yes: NPRS scale: 5/10; pop 10/10 Pain location: right knee Pain description: medial and lateral joint line and anterior knee  Aggravating factors: bending Relieving factors: take Tylenol   PRECAUTIONS: None     WEIGHT BEARING RESTRICTIONS: No  FALLS:  Has patient fallen in last 6 months? No  OCCUPATION: works on a farm  PLOF: Independent  PATIENT GOALS: decreased right knee pain  NEXT MD VISIT: PRN  OBJECTIVE:  Note: Objective  measures were completed at Evaluation unless otherwise noted.  DIAGNOSTIC FINDINGS:   Narrative & Impression  Imaging of the right knee   No injury   Alignment looks normal joint spaces maintained patellofemoral joint normal   Normal no right knee      PATIENT SURVEYS:  LEFS 36/80 45%  COGNITION: Overall cognitive status: History of cognitive impairments - at baseline    Intellectual development disorder per chart review   POSTURE: No Significant postural limitations  PALPATION: Tender right anterior knee and medial and lateral joint line; medial more than lateral.  LOWER EXTREMITY ROM:  Active ROM Right eval Left eval Right  06/24/23 Right 07/23/23  Hip flexion      Hip extension      Hip abduction      Hip adduction      Hip internal rotation      Hip external rotation      Knee flexion 108* 131 110 132  Knee extension -5 0    Ankle dorsiflexion      Ankle plantarflexion      Ankle inversion      Ankle eversion       (Blank rows = not tested)  LOWER EXTREMITY MMT:  MMT Right eval Left eval Right 07/23/23 Left 07/23/23  Hip flexion 4* 5 4+ 5  Hip extension 3 4- 4+ 4+  Hip abduction 3- 4+  5  Hip adduction      Hip internal rotation      Hip external rotation      Knee flexion 4- 4+ 5 5  Knee extension 4-* 5 4+ 5  Ankle dorsiflexion 4+ 5 5 5   Ankle plantarflexion      Ankle inversion      Ankle eversion       (Blank rows = not tested)  LOWER EXTREMITY SPECIAL TESTS:  Knee special tests: Anterior drawer test: positive , Patellafemoral apprehension test: negative, and Valgus stress test painful mildly lax compared to left  FUNCTIONAL TESTS:  5 times sit to stand: 14.34 sec using hands to assist  07/23/23: 445ft no AD, no pain good mechanics 5STS 11.49" with no HHA  GAIT: Distance walked: 50 ft in clinic Assistive device utilized: None Level of assistance: Modified independence Comments: slight antalgic gait noted; decreased stance right  lower extremity  TREATMENT DATE:  07/23/23: 472ft no AD, no pain good mechanics 5STS 11.49" MMT ROM  LEFS 72/80  07/10/2023  Therapeutic Exercise: -Bike, 5', decreased speed noted -TKE on 4 inch step, 2 sets of 10 reps, pt cued for upright posture -Leg press, plate 4, 2 sets of 10 reps -Lateral stepping 3 laps 14 steps per lap, with RTB around ankles, pt cued for upright posture  Therapeutic Activity: -Sit to stands to SLS on RLE, blue ball trampoline toss, 2 sets of 5 reps, pt cued for core staggered stance -Step up and overs on bosu ball, 2 sets of 5 reps, pt cued for slow and controlled descent, RLE only -Sled pushes 70 feet per lap, 2 laps, 50 lbs on sled, pt cued for controlled movement, upright posture    07/08/23: -Bike, seat 11, 5', 3/10 pain  -Squat: 3x10, 1st set NBOS, 2nd set WBOS, 3rd staggered stance, RLE further back  -Backward Lunge: 10x -Forward lunge onto BOSU: 10x -Seated hamstring stretch: 2x30" -Supine thomas/quad stretch: 1' hold bilat -Supine Heel slide w/ towel, 10x   07/02/23: Bike seat 13 x 5' Standing: Knee drive on 16XW step 5x 10" Squats front of chair, cueing for mechanics STS eccentric control 10x no HHA Posterior lunges with front leg on 4in step, posterior leg onto 2 pieces of foam STS to SLS alternating LE, yellow ball trampoline toss  Prone: Quad stretch 3x 30 with rope  Supine: AROM: 125 degrees flexion   07/01/2023  Manual Therapy: -Patellar mobs all directions (pain in medial and lateral planes)  Therapeutic Exercise: -Supine bridges 2 sets of 10 reps, 3 second holds, RTB, pt cued for max hip extension -Clamshells, 1 sets of 10 reps bilaterally, RTB -Lateral stepping 3 laps 14 steps per lap, with RTB around ankles, pt cued for upright posture -Towel slide lunges, side and back, 2 set of 5 reps, pt  cued for core activation and upright posture, unable to do second set to the side due to pain   Therapeutic Activity: -Sit to stands to SLS on RLE, yellow ball trampoline toss, 2 sets of 7 reps, pt cued for core staggered stance -Step up and overs on 8 inch step, 2 sets of 5 reps with max -Sled pushes 70 feet per lap, 2 laps, 40 lbs on sled, pt cued for controlled movement, increased pain with pulling    PATIENT EDUCATION:  Education details: Patient educated on exam findings, POC, scope of PT, HEP, and what to expect next visit. Person educated: Patient Education method: Explanation, Demonstration, and Handouts Education comprehension: verbalized understanding, returned demonstration, verbal cues required, and tactile cues required  HOME EXERCISE PROGRAM: Access Code: [email or web address] URL: [email or web address] Date: 06/17/2023 Prepared by: [facility] - Rehab  Exercises - Supine Quad Set  - 2 x daily - 7 x weekly - 1 sets - 10 reps - 5 sec hold - Seated Table Hamstring Stretch  - 2 x daily - 7 x weekly - 3 sets - 5 reps - 20 sec hold - knee bends on the step using rails to hold onto  - 2 x daily - 7 x weekly - 1 sets - 10 reps  Exercises - Supine Gluteal Sets  - 1 x daily - 7 x weekly - 3 sets - 10 reps - Supine Ankle Pumps  - 1 x daily - 7 x weekly - 3 sets - 10 reps  ASSESSMENT:  CLINICAL IMPRESSION: Reviewed goals with the following findings:  Pt has met 2/2 STG  and 5/5 LTGs.  Pt reports he feels improvements by 98% and has been compliant with HEP.  Presents with increased cadence, improved STS time and strength has improved with all mm.  Improved knee mobility in pain free range.  DC to HEP.    Patient is a 28 y.o. male who was seen today for physical therapy evaluation and treatment for M25.561 (ICD-10-CM) - Acute pain of right knee M22.41 (ICD-10-CM) - Chondromalacia patellae, right knee. Patient demonstrates muscle weakness, reduced ROM, and fascial restrictions which are  likely contributing to symptoms of pain and are negatively impacting patient ability to perform ADLs and functional mobility tasks. Patient will benefit from skilled physical therapy services to address these deficits to reduce pain and improve level of function with ADLs and functional mobility tasks.   OBJECTIVE IMPAIRMENTS: Abnormal gait, decreased activity tolerance, decreased mobility, difficulty walking, decreased ROM, decreased strength, increased fascial restrictions, impaired perceived functional ability, and pain.   ACTIVITY LIMITATIONS: carrying, lifting, bending, standing, squatting, stairs, and locomotion level  PARTICIPATION LIMITATIONS: meal prep, cleaning, community activity, occupation, and yard work  [facility] POTENTIAL: Good  CLINICAL DECISION MAKING: Evolving/moderate complexity  EVALUATION COMPLEXITY: Moderate   GOALS: Goals reviewed with patient? No  SHORT TERM GOALS: Target date: 07/01/2023 patient will be independent with initial HEP  Baseline:  07/23/23:  Reports compliance with HEP daily. Goal status: MET  2.  Patient will report 30% improvement overall   Baseline: 07/23/23:  98% Goal status: MET  3.  Patient will improve right knee flexion by 12 degrees to improve ability to navigate steps Baseline: 07/23/23: 132 degrees (was 110) Goal status: MET  LONG TERM GOALS: Target date: 07/15/2023  Patient will be independent in self management strategies to improve quality of life and functional outcomes.  Baseline: 07/23/23: Goal status: MET  2.  Patient will report 50% improvement overall   Baseline: 07/23/23:  98% improvements Goal status: MET  3.  Patient will improve 5 times sit to stand score to 11 sec or less without use of Ue's to demonstrate improved functional mobility and  increased leg strength. Baseline: 14.34 sec with Ue's to assist; 07/23/23: 11.49" Goal status: MET  4.   Patient will increase right leg MMT's to 4+ to 5/5 to allow navigation of steps  without gait deviation or loss of balance   Baseline: 07/23/23: see above Goal status: MET  5.  Patient will improve LEFS score by 14 points to demonstrate improved perceived function  Baseline: 36/80; 07/23/23: 72/80=90% Goal status: MET    PLAN:  PT FREQUENCY: 2x/week  PT DURATION: 4 weeks  PLANNED INTERVENTIONS: 97164- PT Re-evaluation, 97110-Therapeutic exercises, 97530- Therapeutic activity, 97112- Neuromuscular re-education, 97535- Self Care, 16109- Manual therapy, [phone]- Gait training, [phone]- Orthotic Fit/training, [phone]- Canalith repositioning, [id number]- Aquatic Therapy, [phone]- Splinting, Patient/Family education, Balance training, Stair training, Taping, Dry Needling, Joint mobilization, Joint manipulation, Spinal manipulation, Spinal mobilization, Scar mobilization, and DME instructions.   PLAN FOR NEXT SESSION: DC to HEP   Minor Amble, LPTA/CLT; CBIS [phone]  4:07 PM, 07/23/23

## 2023-07-28 ENCOUNTER — Encounter (HOSPITAL_COMMUNITY): Payer: MEDICAID

## 2023-10-18 ENCOUNTER — Other Ambulatory Visit: Payer: Self-pay

## 2023-10-18 ENCOUNTER — Emergency Department (HOSPITAL_COMMUNITY): Admission: EM | Admit: 2023-10-18 | Discharge: 2023-10-18 | Disposition: A | Discharge status: Home / Self Care

## 2023-10-18 ENCOUNTER — Encounter (HOSPITAL_COMMUNITY): Payer: Self-pay | Admitting: Emergency Medicine

## 2023-10-18 DIAGNOSIS — R569 Unspecified convulsions: Secondary | ICD-10-CM | POA: Diagnosis present

## 2023-10-18 DIAGNOSIS — F445 Conversion disorder with seizures or convulsions: Secondary | ICD-10-CM | POA: Diagnosis not present

## 2023-10-18 LAB — BASIC METABOLIC PANEL WITH GFR
Anion gap: 10 (ref 5–15)
BUN: 17 mg/dL (ref 6–20)
CO2: 23 mmol/L (ref 22–32)
Calcium: 9 mg/dL (ref 8.9–10.3)
Chloride: 105 mmol/L (ref 98–111)
Creatinine, Ser: 0.97 mg/dL (ref 0.61–1.24)
GFR, Estimated: >60 mL/min (ref >60)
Glucose, Bld: 107 mg/dL — ABNORMAL HIGH (ref 70–99)
Potassium: 3.6 mmol/L (ref 3.5–5.1)
Sodium: 138 mmol/L (ref 135–145)

## 2023-10-18 LAB — CBC WITH DIFFERENTIAL/PLATELET
Abs Immature Granulocytes: 0.03 K/uL (ref 0.00–0.07)
Basophils Absolute: 0 K/uL (ref 0.0–0.1)
Basophils Relative: 0 %
Eosinophils Absolute: 0.6 K/uL — ABNORMAL HIGH (ref 0.0–0.5)
Eosinophils Relative: 7 %
HCT: 42 % (ref 39.0–52.0)
Hemoglobin: 14.7 g/dL (ref 13.0–17.0)
Immature Granulocytes: 0 %
Lymphocytes Relative: 28 %
Lymphs Abs: 2.4 K/uL (ref 0.7–4.0)
MCH: 32.2 pg (ref 26.0–34.0)
MCHC: 35 g/dL (ref 30.0–36.0)
MCV: 91.9 fL (ref 80.0–100.0)
Monocytes Absolute: 0.8 K/uL (ref 0.1–1.0)
Monocytes Relative: 9 %
Neutro Abs: 4.9 K/uL (ref 1.7–7.7)
Neutrophils Relative %: 56 %
Platelets: 300 K/uL (ref 150–400)
RBC: 4.57 MIL/uL (ref 4.22–5.81)
RDW: 12.6 % (ref 11.5–15.5)
WBC: 8.8 K/uL (ref 4.0–10.5)
nRBC: 0 % (ref 0.0–0.2)

## 2023-10-18 LAB — URINALYSIS, ROUTINE W REFLEX MICROSCOPIC
Bilirubin Urine: NEGATIVE
Glucose, UA: NEGATIVE mg/dL
Hgb urine dipstick: NEGATIVE
Ketones, ur: NEGATIVE mg/dL
Leukocytes,Ua: NEGATIVE
Nitrite: NEGATIVE
Protein, ur: NEGATIVE mg/dL
Specific Gravity, Urine: 1.024 (ref 1.005–1.030)
pH: 6 (ref 5.0–8.0)

## 2023-10-18 LAB — MAGNESIUM: Magnesium: 2.5 mg/dL — ABNORMAL HIGH (ref 1.7–2.4)

## 2023-10-18 LAB — CBG MONITORING, ED: Glucose-Capillary: 111 mg/dL — ABNORMAL HIGH (ref 70–99)

## 2023-10-18 MED ORDER — ACETAMINOPHEN 500 MG PO TABS
1000.0000 mg | ORAL_TABLET | Freq: Once | ORAL | Status: AC
  Administered 2023-10-18: 1000 mg via ORAL
  Filled 2023-10-18: qty 2

## 2023-10-18 NOTE — ED Triage Notes (Signed)
 To ed via [facility] from home. 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 Pt takes lamictal . This seizure was around 1830. Ems gave 500mg  tylenol  d/t pt hitting head. Seizure was witnessed by mom and sister. Per family; Pt fell and was convulsing. Unknown length of time of seizure.

## 2023-10-18 NOTE — ED Provider Notes (Signed)
 [place] EMERGENCY DEPARTMENT AT [facility] Provider Note   CSN: [id number] Arrival date & time: 10/18/23  8084     Patient presents with: Seizures   Joel Arroyo is a 28 y.o. adult.  {Add pertinent medical, surgical, social history, OB history to HPI:6166} 28 year old male presents for evaluation of seizure.  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  States before this he was talking to his mom he does not remember the event.  Denies any symptoms or concerns at this time except for being hot.  He is on medications for his pseudo seizures and states he has not missed any doses.   Seizures      Prior to Admission medications   Medication Sig Start Date End Date Taking? Authorizing Provider  ADDERALL XR 25 MG 24 hr capsule Take 25 mg by mouth every morning. 10/17/23   [provider]  albuterol  (VENTOLIN  HFA) 108 (90 Base) MCG/ACT inhaler Inhale 1 puff into the lungs every 4 (four) hours as needed (SOB). 02/07/19   Clapacs, Norleen DASEN, MD  ARIPiprazole (ABILIFY) 30 MG tablet Take 30 mg by mouth daily. 09/30/23   [provider]  busPIRone (BUSPAR) 10 MG tablet Take 10 mg by mouth 2 (two) times daily. 09/24/23   [provider]  fluvoxaMINE  (LUVOX ) 50 MG tablet Take 3 tablets (150 mg total) by mouth at bedtime. 02/07/19   Clapacs, Norleen DASEN, MD  guanFACINE (TENEX) 1 MG tablet Take 1 mg by mouth daily. 09/24/23   [provider]  lamoTRIgine  (LAMICTAL ) 150 MG tablet SMARTSIG:1 Tablet(s) By Mouth Every Evening    [provider]  lurasidone  (LATUDA ) 80 MG TABS tablet Take 1 tablet (80 mg total) by mouth daily with supper. 02/07/19   Clapacs, Norleen DASEN, MD  meloxicam  (MOBIC ) 7.5 MG tablet Take 1 tablet (7.5 mg total) by mouth daily. 05/18/23   Margrette Taft BRAVO, MD  OLANZapine (ZYPREXA) 5 MG tablet Take 5 mg by mouth daily.    [provider]  oxcarbazepine  (TRILEPTAL ) 600 MG tablet Take 1 tablet (600 mg total) by mouth 2 (two)  times daily. 02/07/19   Clapacs, Norleen DASEN, MD  pantoprazole  (PROTONIX ) 20 MG tablet Take 1 tablet (20 mg total) by mouth daily. 02/07/19   Clapacs, Norleen DASEN, MD  prazosin  (MINIPRESS ) 1 MG capsule Take 1 capsule (1 mg total) by mouth daily. 02/07/19   Clapacs, Norleen DASEN, MD    Allergies: Patient has no known allergies.    Review of Systems  Constitutional:  Negative for chills and fever.  HENT:  Negative for ear pain and sore throat.   Eyes:  Negative for pain and visual disturbance.  Respiratory:  Negative for cough and shortness of breath.   Cardiovascular:  Negative for chest pain and palpitations.  Gastrointestinal:  Negative for abdominal pain and vomiting.  Genitourinary:  Negative for dysuria and hematuria.  Musculoskeletal:  Negative for arthralgias and back pain.  Skin:  Negative for color change and rash.  Neurological:  Positive for seizures. Negative for syncope.  All other systems reviewed and are negative.   Updated Vital Signs BP (!) 133/90 (BP Location: Right Arm)   Pulse 64   Temp 98.5 F (36.9 C) (Oral)   Resp 16   Ht 6' (1.829 m)   Wt 99.8 kg   SpO2 94%   BMI 29.84 kg/m   Physical Exam Vitals and nursing note reviewed.  Constitutional:      General: She is not  in acute distress.    Appearance: Normal appearance. She is well-developed. She is not ill-appearing.  HENT:     Head: Normocephalic and atraumatic.  Eyes:     Conjunctiva/sclera: Conjunctivae normal.  Cardiovascular:     Rate and Rhythm: Normal rate and regular rhythm.     Pulses: Normal pulses.     Heart sounds: Normal heart sounds. No murmur heard. Pulmonary:     Effort: Pulmonary effort is normal. No respiratory distress.     Breath sounds: Normal breath sounds.  Abdominal:     Palpations: Abdomen is soft.     Tenderness: There is no abdominal tenderness.  Musculoskeletal:        General: No swelling.     Cervical back: Neck supple.  Skin:    General: Skin is warm and dry.     Capillary  Refill: Capillary refill takes less than 2 seconds.  Neurological:     Mental Status: She is alert.  Psychiatric:        Mood and Affect: Mood normal.     (all labs ordered are listed, but only abnormal results are displayed) Labs Reviewed  BASIC METABOLIC PANEL WITH GFR  CBC WITH DIFFERENTIAL/PLATELET  URINALYSIS, ROUTINE W REFLEX MICROSCOPIC  MAGNESIUM   CBG MONITORING, ED    EKG: None  Radiology: No results found.  {Document cardiac monitor, telemetry assessment procedure when appropriate:32947} Procedures   Medications Ordered in the ED  acetaminophen  (TYLENOL ) tablet 1,000 mg (has no administration in time range)      {Click here for ABCD2, HEART and other calculators REFRESH Note before signing:1}                              Medical Decision Making Amount and/or Complexity of Data Reviewed Labs: ordered.  Risk OTC drugs.   ***  {Document critical care time when appropriate  Document review of labs and clinical decision tools ie CHADS2VASC2, etc  Document your independent review of radiology images and any outside records  Document your discussion with family members, caretakers and with consultants  Document social determinants of health affecting pt's care  Document your decision making why or why not admission, treatments were needed:32947:::1}   Final diagnoses:  None    ED Discharge Orders     None

## 2023-10-18 NOTE — Discharge Instructions (Addendum)
 Take your medications as prescribed and follow-up with your primary care doctor and neurology.  Return to the ER for new or worsening symptoms.
